# Patient Record
Sex: Female | Born: 1980
Health system: Southern US, Community
[De-identification: ages and names within clinical notes are randomized; demographics above are authoritative.]

## PROBLEM LIST (undated history)

## (undated) ENCOUNTER — Inpatient Hospital Stay (HOSPITAL_COMMUNITY): Payer: Self-pay

## (undated) DIAGNOSIS — R1310 Dysphagia, unspecified: Secondary | ICD-10-CM

## (undated) DIAGNOSIS — I00 Rheumatic fever without heart involvement: Secondary | ICD-10-CM

## (undated) DIAGNOSIS — Z9289 Personal history of other medical treatment: Secondary | ICD-10-CM

## (undated) DIAGNOSIS — T7840XA Allergy, unspecified, initial encounter: Secondary | ICD-10-CM

## (undated) DIAGNOSIS — K219 Gastro-esophageal reflux disease without esophagitis: Secondary | ICD-10-CM

## (undated) DIAGNOSIS — T8859XA Other complications of anesthesia, initial encounter: Secondary | ICD-10-CM

## (undated) DIAGNOSIS — G709 Myoneural disorder, unspecified: Secondary | ICD-10-CM

## (undated) DIAGNOSIS — Z8759 Personal history of other complications of pregnancy, childbirth and the puerperium: Secondary | ICD-10-CM

## (undated) DIAGNOSIS — G43909 Migraine, unspecified, not intractable, without status migrainosus: Secondary | ICD-10-CM

## (undated) DIAGNOSIS — R011 Cardiac murmur, unspecified: Secondary | ICD-10-CM

## (undated) DIAGNOSIS — R319 Hematuria, unspecified: Secondary | ICD-10-CM

## (undated) DIAGNOSIS — E739 Lactose intolerance, unspecified: Secondary | ICD-10-CM

## (undated) DIAGNOSIS — Z87442 Personal history of urinary calculi: Secondary | ICD-10-CM

## (undated) DIAGNOSIS — B009 Herpesviral infection, unspecified: Secondary | ICD-10-CM

## (undated) DIAGNOSIS — Z1589 Genetic susceptibility to other disease: Secondary | ICD-10-CM

## (undated) DIAGNOSIS — Z973 Presence of spectacles and contact lenses: Secondary | ICD-10-CM

## (undated) DIAGNOSIS — E7212 Methylenetetrahydrofolate reductase deficiency: Secondary | ICD-10-CM

## (undated) DIAGNOSIS — J45909 Unspecified asthma, uncomplicated: Secondary | ICD-10-CM

## (undated) DIAGNOSIS — Z8739 Personal history of other diseases of the musculoskeletal system and connective tissue: Secondary | ICD-10-CM

## (undated) DIAGNOSIS — D649 Anemia, unspecified: Secondary | ICD-10-CM

## (undated) DIAGNOSIS — Z8679 Personal history of other diseases of the circulatory system: Secondary | ICD-10-CM

## (undated) DIAGNOSIS — I7300 Raynaud's syndrome without gangrene: Secondary | ICD-10-CM

## (undated) DIAGNOSIS — R002 Palpitations: Secondary | ICD-10-CM

## (undated) DIAGNOSIS — I341 Nonrheumatic mitral (valve) prolapse: Secondary | ICD-10-CM

## (undated) DIAGNOSIS — T4145XA Adverse effect of unspecified anesthetic, initial encounter: Secondary | ICD-10-CM

## (undated) HISTORY — DX: Methylenetetrahydrofolate reductase deficiency: E72.12

## (undated) HISTORY — DX: Allergy, unspecified, initial encounter: T78.40XA

## (undated) HISTORY — PX: NASAL SINUS SURGERY: SHX719

## (undated) HISTORY — DX: Adverse effect of unspecified anesthetic, initial encounter: T41.45XA

## (undated) HISTORY — DX: Cardiac murmur, unspecified: R01.1

## (undated) HISTORY — DX: Genetic susceptibility to other disease: Z15.89

## (undated) HISTORY — DX: Migraine, unspecified, not intractable, without status migrainosus: G43.909

## (undated) HISTORY — DX: Lactose intolerance, unspecified: E73.9

## (undated) HISTORY — DX: Herpesviral infection, unspecified: B00.9

## (undated) HISTORY — DX: Palpitations: R00.2

## (undated) HISTORY — DX: Other complications of anesthesia, initial encounter: T88.59XA

## (undated) HISTORY — DX: Myoneural disorder, unspecified: G70.9

## (undated) HISTORY — DX: Anemia, unspecified: D64.9

## (undated) HISTORY — PX: KIDNEY STONE SURGERY: SHX686

## (undated) HISTORY — DX: Rheumatic fever without heart involvement: I00

## (undated) HISTORY — DX: Dysphagia, unspecified: R13.10

## (undated) HISTORY — PX: WISDOM TOOTH EXTRACTION: SHX21

## (undated) HISTORY — DX: Gastro-esophageal reflux disease without esophagitis: K21.9

---
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
## 2002-04-07 ENCOUNTER — Encounter: Admission: RE | Admit: 2002-04-07 | Discharge: 2002-04-07 | Payer: Self-pay | Admitting: Internal Medicine

## 2002-04-07 ENCOUNTER — Encounter: Payer: Self-pay | Admitting: Internal Medicine

## 2002-04-29 ENCOUNTER — Encounter: Admission: RE | Admit: 2002-04-29 | Discharge: 2002-06-16 | Payer: Self-pay | Admitting: Family Medicine

## 2006-01-17 ENCOUNTER — Other Ambulatory Visit: Admission: RE | Admit: 2006-01-17 | Discharge: 2006-01-17 | Payer: Self-pay | Admitting: Family Medicine

## 2008-04-28 ENCOUNTER — Inpatient Hospital Stay (HOSPITAL_COMMUNITY): Admission: AD | Admit: 2008-04-28 | Discharge: 2008-04-28 | Payer: Self-pay | Admitting: Obstetrics & Gynecology

## 2010-07-18 ENCOUNTER — Emergency Department (HOSPITAL_COMMUNITY): Admission: EM | Admit: 2010-07-18 | Discharge: 2010-07-19 | Payer: Self-pay | Admitting: Emergency Medicine

## 2011-07-19 LAB — RH IMMUNE GLOBULIN WORKUP (NOT WOMEN'S HOSP): Weak D: NEGATIVE

## 2011-10-23 ENCOUNTER — Encounter: Payer: Self-pay | Admitting: *Deleted

## 2011-10-23 ENCOUNTER — Emergency Department (HOSPITAL_BASED_OUTPATIENT_CLINIC_OR_DEPARTMENT_OTHER)
Admission: EM | Admit: 2011-10-23 | Discharge: 2011-10-23 | Disposition: A | Discharge status: Home / Self Care | Payer: Medicaid Other | Attending: Emergency Medicine | Admitting: Emergency Medicine

## 2011-10-23 DIAGNOSIS — J329 Chronic sinusitis, unspecified: Secondary | ICD-10-CM

## 2011-10-23 DIAGNOSIS — R51 Headache: Secondary | ICD-10-CM | POA: Insufficient documentation

## 2011-10-23 HISTORY — DX: Migraine, unspecified, not intractable, without status migrainosus: G43.909

## 2011-10-23 MED ORDER — AMOXICILLIN 500 MG PO CAPS: 500.0000 mg | ORAL_CAPSULE | Freq: Three times a day (TID) | ORAL | Status: AC

## 2011-10-23 MED ORDER — KETOROLAC TROMETHAMINE 60 MG/2ML IM SOLN
60.0000 mg | Freq: Once | INTRAMUSCULAR | Status: AC
  Administered 2011-10-23: 60 mg via INTRAMUSCULAR
  Filled 2011-10-23: qty 2

## 2011-10-23 NOTE — ED Notes (Addendum)
Headache for 3 days has tried drinking lots of water taking tylenol and sleeping mostly on left temporal area no sensitivity to light or sound sitting up makes it hurt worse bending over makes it worse

## 2011-10-23 NOTE — ED Provider Notes (Signed)
Medical screening examination/treatment/procedure(s) were performed by non-physician practitioner and as supervising physician I was immediately available for consultation/collaboration.   Leigh-Ann Emalina Dubreuil, MD 10/23/11 1918 

## 2011-10-23 NOTE — ED Provider Notes (Signed)
History     CSN: [id number]  Arrival date & time 10/23/11  1705   First MD Initiated Contact with Patient 10/23/11 1740      Chief Complaint  Patient presents with  . Headache    (Consider location/radiation/quality/duration/timing/severity/associated sxs/prior treatment) HPI Comments: Pt states that she has a history of migraines, but this feels different then normal  Patient is a 31 y.o. female presenting with headaches. The history is provided by the patient. No language interpreter was used.  Headache  This is a new problem. The current episode started more than 2 days ago. The problem occurs constantly. The problem has not changed since onset.Associated with: position. The pain is located in the left unilateral region. The quality of the pain is described as throbbing. The pain is moderate. The pain radiates to the face. Pertinent negatives include no fever, no palpitations, no syncope, no nausea and no vomiting. She has tried nothing for the symptoms.    Past Medical History  Diagnosis Date  . Migraines     History reviewed. No pertinent past surgical history.  History reviewed. No pertinent family history.  History  Substance Use Topics  . Smoking status: Never Smoker   . Smokeless tobacco: Not on file  . Alcohol Use: Yes     occ    OB History    Grav Para Term Preterm Abortions TAB SAB Ect Mult Living                  Review of Systems  Constitutional: Negative for fever.  Cardiovascular: Negative for palpitations and syncope.  Gastrointestinal: Negative for nausea and vomiting.  Neurological: Positive for headaches.  All other systems reviewed and are negative.    Allergies  Other and Codeine  Home Medications   Current Outpatient Rx  Name Route Sig Dispense Refill  . ACETAMINOPHEN 325 MG PO TABS Oral Take 1,300 mg by mouth every 6 (six) hours as needed. For headache     . CETIRIZINE HCL 10 MG PO TABS Oral Take 10 mg by mouth daily.      .  DROSPIRENONE-ETHINYL ESTRADIOL 3-0.02 MG PO TABS Oral Take 1 tablet by mouth daily.      . TOPIRAMATE 15 MG PO CPSP Oral Take 15 mg by mouth daily.      . AMOXICILLIN 500 MG PO CAPS Oral Take 1 capsule (500 mg total) by mouth 3 (three) times daily. 30 capsule 0    BP 130/87  Temp(Src) 98.5 F (36.9 C) (Oral)  Resp 18  Wt 132 lb (59.875 kg)  SpO2 100%  Physical Exam  Nursing note and vitals reviewed. Constitutional: She is oriented to person, place, and time. She appears well-developed and well-nourished.  HENT:  Right Ear: External ear normal.  Left Ear: External ear normal.  Nose: Left sinus exhibits maxillary sinus tenderness and frontal sinus tenderness.  Cardiovascular: Normal rate and regular rhythm.   Pulmonary/Chest: Effort normal.  Musculoskeletal: Normal range of motion.  Neurological: She is alert and oriented to person, place, and time.  Skin: Skin is warm and dry.  Psychiatric: She has a normal mood and affect.    ED Course  Procedures (including critical care time)  Labs Reviewed - No data to display No results found.   1. Sinusitis       MDM  Will treat pt        Teressa Lower, NP 10/23/11 1848

## 2013-01-01 DIAGNOSIS — J309 Allergic rhinitis, unspecified: Secondary | ICD-10-CM | POA: Insufficient documentation

## 2013-02-19 HISTORY — PX: NASAL SEPTUM SURGERY: SHX37

## 2013-03-07 ENCOUNTER — Emergency Department (HOSPITAL_BASED_OUTPATIENT_CLINIC_OR_DEPARTMENT_OTHER)
Admission: EM | Admit: 2013-03-07 | Discharge: 2013-03-07 | Disposition: A | Discharge status: Home / Self Care | Payer: Medicaid Other | Attending: Emergency Medicine | Admitting: Emergency Medicine

## 2013-03-07 ENCOUNTER — Encounter (HOSPITAL_BASED_OUTPATIENT_CLINIC_OR_DEPARTMENT_OTHER): Payer: Self-pay | Admitting: Emergency Medicine

## 2013-03-07 DIAGNOSIS — R609 Edema, unspecified: Secondary | ICD-10-CM | POA: Insufficient documentation

## 2013-03-07 DIAGNOSIS — R209 Unspecified disturbances of skin sensation: Secondary | ICD-10-CM | POA: Insufficient documentation

## 2013-03-07 DIAGNOSIS — Z79899 Other long term (current) drug therapy: Secondary | ICD-10-CM | POA: Insufficient documentation

## 2013-03-07 DIAGNOSIS — Z9889 Other specified postprocedural states: Secondary | ICD-10-CM | POA: Insufficient documentation

## 2013-03-07 DIAGNOSIS — G43909 Migraine, unspecified, not intractable, without status migrainosus: Secondary | ICD-10-CM | POA: Insufficient documentation

## 2013-03-07 DIAGNOSIS — Z792 Long term (current) use of antibiotics: Secondary | ICD-10-CM | POA: Insufficient documentation

## 2013-03-07 LAB — CBC WITH DIFFERENTIAL/PLATELET
Basophils Absolute: 0 10*3/uL (ref 0.0–0.1)
Basophils Relative: 0 % (ref 0–1)
Lymphocytes Relative: 48 % — ABNORMAL HIGH (ref 12–46)
MCHC: 34.5 g/dL (ref 30.0–36.0)
Neutro Abs: 4 10*3/uL (ref 1.7–7.7)
Neutrophils Relative %: 43 % (ref 43–77)
Platelets: 262 10*3/uL (ref 150–400)
RDW: 12 % (ref 11.5–15.5)
WBC: 9.2 10*3/uL (ref 4.0–10.5)

## 2013-03-07 LAB — COMPREHENSIVE METABOLIC PANEL
Alkaline Phosphatase: 69 U/L (ref 39–117)
BUN: 10 mg/dL (ref 6–23)
Calcium: 9.5 mg/dL (ref 8.4–10.5)
GFR calc Af Amer: >90 mL/min (ref >90)
GFR calc non Af Amer: 86 mL/min — ABNORMAL LOW (ref >90)
Glucose, Bld: 88 mg/dL (ref 70–99)
Potassium: 3.9 mEq/L (ref 3.5–5.1)
Sodium: 141 mEq/L (ref 135–145)
Total Protein: 6.2 g/dL (ref 6.0–8.3)

## 2013-03-07 LAB — URINALYSIS, ROUTINE W REFLEX MICROSCOPIC
Ketones, ur: NEGATIVE mg/dL
Leukocytes, UA: NEGATIVE
Nitrite: NEGATIVE
Specific Gravity, Urine: 1.006 (ref 1.005–1.030)
Urobilinogen, UA: 0.2 mg/dL (ref 0.0–1.0)
pH: 6.5 (ref 5.0–8.0)

## 2013-03-07 LAB — URINE MICROSCOPIC-ADD ON

## 2013-03-07 MED ORDER — SENNOSIDES-DOCUSATE SODIUM 8.6-50 MG PO TABS: 2.0000 | ORAL_TABLET | Freq: Every day | ORAL | Status: DC

## 2013-03-07 NOTE — ED Provider Notes (Signed)
History     CSN: [id number]  Arrival date & time 03/07/13  1026   First MD Initiated Contact with Patient 03/07/13 1146      Chief Complaint  Patient presents with  . Edema    (Consider location/radiation/quality/duration/timing/severity/associated sxs/prior treatment) HPI Patient presents 4 days after surgery with concern over swelling in her lower back, upper thighs. Surgery was for chronic sinusitis, with significant debridement of intra-sinus tissue, septal revision, meatus opening. She states that she has been generally well since surgery, but over the past day has developed swelling in her lower back with mild dysesthesia across the area. No new fever, no new nausea, vomiting, chest pain, dyspnea, abdominal pain. No tabs or relief with anything. She does state that since she has been home she has been largely sedentary. Past Medical History  Diagnosis Date  . Migraines     Past Surgical History  Procedure Laterality Date  . Nasal septum surgery      No family history on file.  History  Substance Use Topics  . Smoking status: Never Smoker   . Smokeless tobacco: Not on file  . Alcohol Use: Yes     Comment: occ    OB History   Grav Para Term Preterm Abortions TAB SAB Ect Mult Living                  Review of Systems  All other systems reviewed and are negative.    Allergies  Other and Codeine  Home Medications   Current Outpatient Rx  Name  Route  Sig  Dispense  Refill  . cetirizine (ZYRTEC) 10 MG tablet   Oral   Take 10 mg by mouth daily.           . diphenhydrAMINE (BENADRYL) 25 mg capsule   Oral   Take 25 mg by mouth every 6 (six) hours as needed for itching.         . docusate sodium (COLACE) 100 MG capsule   Oral   Take 100 mg by mouth 2 (two) times daily.         Marland Kitchen HYDROcodone-acetaminophen (NORCO/VICODIN) 5-325 MG per tablet   Oral   Take 2 tablets by mouth every 4 (four) hours as needed for pain.         Marland Kitchen ibuprofen  (ADVIL,MOTRIN) 200 MG tablet   Oral   Take 200 mg by mouth every 6 (six) hours as needed for pain.         Marland Kitchen levofloxacin (LEVAQUIN) 750 MG tablet   Oral   Take 750 mg by mouth daily.         . ondansetron (ZOFRAN-ODT) 4 MG disintegrating tablet   Oral   Take 4 mg by mouth every 8 (eight) hours as needed for nausea.         Marland Kitchen topiramate (TOPAMAX) 15 MG capsule   Oral   Take 15 mg by mouth daily.             BP 122/68  Pulse 75  Temp(Src) 98.9 F (37.2 C) (Oral)  Resp 20  Ht 5\' 5"  (1.651 m)  Wt 149 lb (67.586 kg)  BMI 24.79 kg/m2  SpO2 98%  LMP 02/24/2013  Physical Exam  Nursing note and vitals reviewed. Constitutional: She is oriented to person, place, and time. She appears well-developed and well-nourished. No distress.  HENT:  Head: Normocephalic.  Patient is wearing a surgical dressing across the anterior aspect of her nose.  No  active bleeding, no discharge, superficially everything appears clean, dry, intact.  Eyes: Conjunctivae and EOM are normal.  Cardiovascular: Normal rate and regular rhythm.   Pulmonary/Chest: Effort normal and breath sounds normal. No stridor. No respiratory distress.  Abdominal: She exhibits no distension.  Musculoskeletal: She exhibits no edema.  Neurological: She is alert and oriented to person, place, and time. No cranial nerve deficit.  Skin: Skin is warm and dry.  Psychiatric: She has a normal mood and affect.    ED Course  Procedures (including critical care time)  Labs Reviewed  CBC WITH DIFFERENTIAL - Abnormal; Notable for the following:    RBC 3.68 (*)    Hemoglobin 11.0 (*)    HCT 31.9 (*)    Lymphocytes Relative 48 (*)    Lymphs Abs 4.4 (*)    All other components within normal limits  COMPREHENSIVE METABOLIC PANEL  URINALYSIS, ROUTINE W REFLEX MICROSCOPIC   No results found.   No diagnosis found.  3:02 PM Patient reexamined.  No new complaints. She remained hemodynamically stable. I discussed all  findings thus far with the patient and her father. We also discussed at length the patient's analgesic regimen. I suggested substituting acetaminophen or ibuprofen, keeping in mind toxic levels to attempt to wean from narcotics.  Pulse ox 99% room air normal MDM  This patient presents 4 days after nasal surgery with ongoing concern over swelling in her lower back.  She is awake and alert, not hypoxic, tachypneic, tachycardic, with no dyspnea.  The patient has no risk factors for DVT /pe, and has not truly been immobilized. Given this absence of concerns, imaging was not indicated, but conducted to evaluate for electrolyte abnormalities, infection.  Labs were mildly abnormal, and the patient was made aware of these findings. Acid distress, with an otherwise reassuring physical exam, she was discharged to followup with her ENT doctor in 2 days as previously scheduled.         Gerhard Munch, MD 03/07/13 1504

## 2013-03-07 NOTE — ED Notes (Signed)
Pt states she has swelling at lower back with some numbness today.  Pt recently had nasal surgery on Wednesday.  No known injury.  Pt states she has had to remain upright after surgery.  No dysuria.  Some chills.

## 2013-06-30 ENCOUNTER — Emergency Department (HOSPITAL_COMMUNITY)
Admission: EM | Admit: 2013-06-30 | Discharge: 2013-06-30 | Disposition: A | Discharge status: Home / Self Care | Payer: Medicaid Other | Attending: Emergency Medicine | Admitting: Emergency Medicine

## 2013-06-30 ENCOUNTER — Emergency Department (HOSPITAL_COMMUNITY): Payer: Medicaid Other

## 2013-06-30 ENCOUNTER — Encounter (HOSPITAL_COMMUNITY): Payer: Self-pay | Admitting: Emergency Medicine

## 2013-06-30 DIAGNOSIS — S60219A Contusion of unspecified wrist, initial encounter: Secondary | ICD-10-CM | POA: Insufficient documentation

## 2013-06-30 DIAGNOSIS — Y93B9 Activity, other involving muscle strengthening exercises: Secondary | ICD-10-CM | POA: Insufficient documentation

## 2013-06-30 DIAGNOSIS — Y9239 Other specified sports and athletic area as the place of occurrence of the external cause: Secondary | ICD-10-CM | POA: Insufficient documentation

## 2013-06-30 DIAGNOSIS — Z79899 Other long term (current) drug therapy: Secondary | ICD-10-CM | POA: Insufficient documentation

## 2013-06-30 DIAGNOSIS — J45909 Unspecified asthma, uncomplicated: Secondary | ICD-10-CM | POA: Insufficient documentation

## 2013-06-30 DIAGNOSIS — S63509A Unspecified sprain of unspecified wrist, initial encounter: Secondary | ICD-10-CM | POA: Insufficient documentation

## 2013-06-30 DIAGNOSIS — S60212A Contusion of left wrist, initial encounter: Secondary | ICD-10-CM

## 2013-06-30 DIAGNOSIS — W1789XA Other fall from one level to another, initial encounter: Secondary | ICD-10-CM | POA: Insufficient documentation

## 2013-06-30 DIAGNOSIS — G43909 Migraine, unspecified, not intractable, without status migrainosus: Secondary | ICD-10-CM | POA: Insufficient documentation

## 2013-06-30 DIAGNOSIS — Z8679 Personal history of other diseases of the circulatory system: Secondary | ICD-10-CM | POA: Insufficient documentation

## 2013-06-30 HISTORY — DX: Nonrheumatic mitral (valve) prolapse: I34.1

## 2013-06-30 NOTE — Progress Notes (Signed)
Pt confirms pcp is still [facility]

## 2013-06-30 NOTE — ED Notes (Signed)
Pt stepped down off of equipment at gym. Pt landed on l/wrist. C/o severe swelling and pain in l/wrist. Gustavus Bryant applied

## 2013-06-30 NOTE — ED Provider Notes (Signed)
CSN: [id number]     Arrival date & time 06/30/13  1616 History  This chart was scribed for non-physician practitioner, Darnelle Going, working with Enid Skeens, MD by Ronal Fear, ED scribe. This patient was seen in room WTR6/WTR6 and the patient's care was started at 5:31 PM.    Chief Complaint  Patient presents with  . Wrist Pain    l/wrist pain after fall    The history is provided by the patient. No language interpreter was used.   HPI Comments: Shelby Norton is a 32 y.o. female who presents to the Emergency Department complaining of left wrist pain and swelling after falling off of gym equipment during a personal training session. Pt feels tingling in her fingers, mainly in the middle and index fingers.The pain worsens upon pronating the wrist, extending the fingers or clinching the fist.  There is no numbness.      Past Medical History  Diagnosis Date  . Migraines   . Asthma   . Mitral valve prolapse    Past Surgical History  Procedure Laterality Date  . Nasal septum surgery    . Tonsillectomy     Family History  Problem Relation Age of Onset  . Diabetes Mother   . Hypertension Mother   . Diabetes Father   . Hypertension Father   . Cancer Other   . Stroke Other    History  Substance Use Topics  . Smoking status: Never Smoker   . Smokeless tobacco: Not on file  . Alcohol Use: Yes     Comment: occ   OB History   Grav Para Term Preterm Abortions TAB SAB Ect Mult Living                 Review of Systems  Musculoskeletal: Positive for joint swelling and arthralgias.  All other systems reviewed and are negative.    Allergies  Other; Codeine; and Hydrocodone  Home Medications   Current Outpatient Rx  Name  Route  Sig  Dispense  Refill  . aspirin-acetaminophen-caffeine (EXCEDRIN EXTRA STRENGTH) 250-250-65 MG per tablet   Oral   Take 1 tablet by mouth every 6 (six) hours as needed for pain (pain).         . cetirizine (ZYRTEC) 10 MG tablet    Oral   Take 10 mg by mouth daily.           . Multiple Vitamins-Minerals (MULTIVITAMIN WITH MINERALS) tablet   Oral   Take 1 tablet by mouth daily.          BP 126/83  Pulse 77  Temp(Src) 98.3 F (36.8 C) (Oral)  Resp 18  Wt 151 lb (68.493 kg)  BMI 25.13 kg/m2  SpO2 100%  LMP 06/25/2013 Physical Exam  Nursing note and vitals reviewed. Constitutional: She is oriented to person, place, and time. She appears well-developed and well-nourished. No distress.  HENT:  Head: Normocephalic and atraumatic.  Eyes: EOM are normal.  Neck: Neck supple. No tracheal deviation present.  Cardiovascular: Normal rate.   Pulmonary/Chest: Effort normal. No respiratory distress.  Musculoskeletal:       Left wrist: She exhibits decreased range of motion, tenderness, bony tenderness and swelling. She exhibits no effusion, no crepitus, no deformity and no laceration.  Neurological: She is alert and oriented to person, place, and time.  Skin: Skin is warm and dry.  Psychiatric: She has a normal mood and affect. Her behavior is normal.    ED Course  Procedures (including  critical care time)\  DIAGNOSTIC STUDIES: Oxygen Saturation is 100% on RA, normal by my interpretation.    COORDINATION OF CARE: 5:46 PM- Pt advised of plan for treatment including getting a Velcro splint with thumb spica and was advised that if pain continues after 2 weeks that she should come back for x ray.  Pt was advised to continue taking OTC naproxen along with icing the wrist and pt agrees.     Labs Review Labs Reviewed - No data to display Imaging Review No results found.  MDM   1. Wrist sprain and strain, left, initial encounter   2. Wrist contusion, left, initial encounter    32 y.o.Shelby Norton's evaluation in the Emergency Department is complete. It has been determined that no acute conditions requiring further emergency intervention are present at this time. The patient/guardian have been advised of the  diagnosis and plan. We have discussed signs and symptoms that warrant return to the ED, such as changes or worsening in symptoms.  Vital signs are stable at discharge. Filed Vitals:   06/30/13 1653  BP: 126/83  Pulse: 77  Temp: 98.3 F (36.8 C)  Resp: 18    Patient/guardian has voiced understanding and agreed to follow-up with the PCP or specialist.  I personally performed the services described in this documentation, which was scribed in my presence. The recorded information has been reviewed and is accurate.   Dorthula Matas, PA-C 07/07/13 2054  Dorthula Matas, PA-C 07/07/13 2054

## 2013-06-30 NOTE — Discharge Instructions (Signed)
Wrist Pain Wrist injuries are frequent in adults and children. A sprain is an injury to the ligaments that hold your bones together. A strain is an injury to muscle or muscle cord-like structures (tendons) from stretching or pulling. Generally, when wrists are moderately tender to touch following a fall or injury, a break in the bone (fracture) may be present. Most wrist sprains or strains are better in 3 to 5 days, but complete healing may take several weeks. HOME CARE INSTRUCTIONS   Put ice on the injured area.  Put ice in a plastic bag.  Place a towel between your skin and the bag.  Leave the ice on for 15-20 minutes, 3-4 times a day, for the first 2 days.  Keep your arm raised above the level of your heart whenever possible to reduce swelling and pain.  Rest the injured area for at least 48 hours or as directed by your caregiver.  If a splint or elastic bandage has been applied, use it for as long as directed by your caregiver or until seen by a caregiver for a follow-up exam.  Only take over-the-counter or prescription medicines for pain, discomfort, or fever as directed by your caregiver.  Keep all follow-up appointments. You may need to follow up with a specialist or have follow-up X-rays. Improvement in pain level is not a guarantee that you did not fracture a bone in your wrist. The only way to determine whether or not you have a broken bone is by X-ray. SEEK IMMEDIATE MEDICAL CARE IF:   Your fingers are swollen, very red, white, or cold and blue.  Your fingers are numb or tingling.  You have increasing pain.  You have difficulty moving your fingers. MAKE SURE YOU:   Understand these instructions.  Will watch your condition.  Will get help right away if you are not doing well or get worse. Document Released: 07/18/2005 Document Revised: 12/31/2011 Document Reviewed: 11/29/2010 [facility] Patient Information 2014 [place], [place].  Wrist Sprain with Rehab A sprain is an  injury in which a ligament that maintains the proper alignment of a joint is partially or completely torn. The ligaments of the wrist are susceptible to sprains. Sprains are classified into three categories. Grade 1 sprains cause pain, but the tendon is not lengthened. Grade 2 sprains include a lengthened ligament because the ligament is stretched or partially ruptured. With grade 2 sprains there is still function, although the function may be diminished. Grade 3 sprains are characterized by a complete tear of the tendon or muscle, and function is usually impaired. SYMPTOMS   Pain tenderness, inflammation, and/or bruising (contusion) of the injury.  A "pop" or tear felt and/or heard at the time of injury.  Decreased wrist function. CAUSES  A wrist sprain occurs when a force is placed on one or more ligaments that is greater than it/they can withstand. Common mechanisms of injury include:  Catching a ball with you hands.  Repetitive and/ or strenuous extension or flexion of the wrist. RISK INCREASES WITH:  Previous wrist injury.  Contact sports (boxing or wrestling).  Activities in which falling is common.  Poor strength and flexibility.  Improperly fitted or padded protective equipment. PREVENTION  Warm up and stretch properly before activity.  Allow for adequate recovery between workouts.  Maintain physical fitness:  Strength, flexibility, and endurance.  Cardiovascular fitness.  Protect the wrist joint by limiting its motion with the use of taping, braces, or splints.  Protect the wrist after injury for 6  to 12 months. PROGNOSIS  The prognosis for wrist sprains depends on the degree of injury. Grade 1 sprains require 2 to 6 weeks of treatment. Grade 2 sprains require 6 to 8 weeks of treatment, and grade 3 sprains require up to 12 weeks.  RELATED COMPLICATIONS   Prolonged healing time, if improperly treated or re-injured.  Recurrent symptoms that result in a chronic  problem.  Injury to nearby structures (bone, cartilage, nerves, or tendons).  Arthritis of the wrist.  Inability to compete in athletics at a high level.  Wrist stiffness or weakness.  Progression to a complete rupture of the ligament. TREATMENT  Treatment initially involves resting from any activities that aggravate the symptoms, and the use of ice and medications to help reduce pain and inflammation. Your caregiver may recommend immobilizing the wrist for a period of time in order to reduce stress on the ligament and allow for healing. After immobilization it is important to perform strengthening and stretching exercises to help regain strength and a full range of motion. These exercises may be completed at home or with a therapist. Surgery is not usually required for wrist sprains, unless the ligament has been ruptured (grade 3 sprain). MEDICATION   If pain medication is necessary, then nonsteroidal anti-inflammatory medications, such as aspirin and ibuprofen, or other minor pain relievers, such as acetaminophen, are often recommended.  Do not take pain medication for 7 days before surgery.  Prescription pain relievers may be given if deemed necessary by your caregiver. Use only as directed and only as much as you need. HEAT AND COLD  Cold treatment (icing) relieves pain and reduces inflammation. Cold treatment should be applied for 10 to 15 minutes every 2 to 3 hours for inflammation and pain and immediately after any activity that aggravates your symptoms. Use ice packs or massage the area with a piece of ice (ice massage).  Heat treatment may be used prior to performing the stretching and strengthening activities prescribed by your caregiver, physical therapist, or athletic trainer. Use a heat pack or soak your injury in warm water. SEEK MEDICAL CARE IF:  Treatment seems to offer no benefit, or the condition worsens.  Any medications produce adverse side effects. EXERCISES RANGE  OF MOTION (ROM) AND STRETCHING EXERCISES - Wrist Sprain  These exercises may help you when beginning to rehabilitate your injury. Your symptoms may resolve with or without further involvement from your physician, physical therapist or athletic trainer. While completing these exercises, remember:   Restoring tissue flexibility helps normal motion to return to the joints. This allows healthier, less painful movement and activity.  An effective stretch should be held for at least 30 seconds.  A stretch should never be painful. You should only feel a gentle lengthening or release in the stretched tissue. RANGE OF MOTION  Wrist Flexion, Active-Assisted  Extend your right / left elbow with your fingers pointing down.*  Gently pull the back of your hand towards you until you feel a gentle stretch on the top of your forearm.  Hold this position for __________ seconds. Repeat __________ times. Complete this exercise __________ times per day.  *If directed by your physician, physical therapist or athletic trainer, complete this stretch with your elbow bent rather than extended. RANGE OF MOTION  Wrist Extension, Active-Assisted  Extend your right / left elbow and turn your palm upwards.*  Gently pull your palm/fingertips back so your wrist extends and your fingers point more toward the ground.  You should feel a gentle  stretch on the inside of your forearm.  Hold this position for __________ seconds. Repeat __________ times. Complete this exercise __________ times per day. *If directed by your physician, physical therapist or athletic trainer, complete this stretch with your elbow bent, rather than extended. RANGE OF MOTION  Supination, Active  Stand or sit with your elbows at your side. Bend your right / left elbow to 90 degrees.  Turn your palm upward until you feel a gentle stretch on the inside of your forearm.  Hold this position for __________ seconds. Slowly release and return to the  starting position. Repeat __________ times. Complete this stretch __________ times per day.  RANGE OF MOTION  Pronation, Active  Stand or sit with your elbows at your side. Bend your right / left elbow to 90 degrees.  Turn your palm downward until you feel a gentle stretch on the top of your forearm.  Hold this position for __________ seconds. Slowly release and return to the starting position. Repeat __________ times. Complete this stretch __________ times per day.  STRETCH - Wrist Flexion  Place the back of your right / left hand on a tabletop leaving your elbow slightly bent. Your fingers should point away from your body.  Gently press the back of your hand down onto the table by straightening your elbow. You should feel a stretch on the top of your forearm.  Hold this position for __________ seconds. Repeat __________ times. Complete this stretch __________ times per day.  STRETCH  Wrist Extension  Place your right / left fingertips on a tabletop leaving your elbow slightly bent. Your fingers should point backwards.  Gently press your fingers and palm down onto the table by straightening your elbow. You should feel a stretch on the inside of your forearm.  Hold this position for __________ seconds. Repeat __________ times. Complete this stretch __________ times per day.  STRENGTHENING EXERCISES - Wrist Sprain These exercises may help you when beginning to rehabilitate your injury. They may resolve your symptoms with or without further involvement from your physician, physical therapist or athletic trainer. While completing these exercises, remember:   Muscles can gain both the endurance and the strength needed for everyday activities through controlled exercises.  Complete these exercises as instructed by your physician, physical therapist or athletic trainer. Progress with the resistance and repetition exercises only as your caregiver advises. STRENGTH Wrist Flexors  Sit with  your right / left forearm palm-up and fully supported. Your elbow should be resting below the height of your shoulder. Allow your wrist to extend over the edge of the surface.  Loosely holding a __________ weight or a piece of rubber exercise band/tubing, slowly curl your hand up toward your forearm.  Hold this position for __________ seconds. Slowly lower the wrist back to the starting position in a controlled manner. Repeat __________ times. Complete this exercise __________ times per day.  STRENGTH  Wrist Extensors  Sit with your right / left forearm palm-down and fully supported. Your elbow should be resting below the height of your shoulder. Allow your wrist to extend over the edge of the surface.  Loosely holding a __________ weight or a piece of rubber exercise band/tubing, slowly curl your hand up toward your forearm.  Hold this position for __________ seconds. Slowly lower the wrist back to the starting position in a controlled manner. Repeat __________ times. Complete this exercise __________ times per day.  STRENGTH - Ulnar Deviators  Stand with a ____________________ weight in your right /  left hand, or sit holding on to the rubber exercise band/tubing with your opposite arm supported.  Move your wrist so that your pinkie travels toward your forearm and your thumb moves away from your forearm.  Hold this position for __________ seconds and then slowly lower the wrist back to the starting position. Repeat __________ times. Complete this exercise __________ times per day STRENGTH - Radial Deviators  Stand with a ____________________ weight in your  right / left hand, or sit holding on to the rubber exercise band/tubing with your arm supported.  Raise your hand upward in front of you or pull up on the rubber tubing.  Hold this position for __________ seconds and then slowly lower the wrist back to the starting position. Repeat __________ times. Complete this exercise  __________ times per day. STRENGTH  Forearm Supinators  Sit with your right / left forearm supported on a table, keeping your elbow below shoulder height. Rest your hand over the edge, palm down.  Gently grip a hammer or a soup ladle.  Without moving your elbow, slowly turn your palm and hand upward to a "thumbs-up" position.  Hold this position for __________ seconds. Slowly return to the starting position. Repeat __________ times. Complete this exercise __________ times per day.  STRENGTH  Forearm Pronators  Sit with your right / left forearm supported on a table, keeping your elbow below shoulder height. Rest your hand over the edge, palm up.  Gently grip a hammer or a soup ladle.  Without moving your elbow, slowly turn your palm and hand upward to a "thumbs-up" position.  Hold this position for __________ seconds. Slowly return to the starting position. Repeat __________ times. Complete this exercise __________ times per day.  STRENGTH - Grip  Grasp a tennis ball, a dense sponge, or a large, rolled sock in your hand.  Squeeze as hard as you can without increasing any pain.  Hold this position for __________ seconds. Release your grip slowly. Repeat __________ times. Complete this exercise __________ times per day.  Document Released: 10/08/2005 Document Revised: 12/31/2011 Document Reviewed: 01/20/2009 [facility] Patient Information 2014 [place], [place].

## 2013-07-09 NOTE — ED Provider Notes (Signed)
Medical screening examination/treatment/procedure(s) were performed by non-physician practitioner and as supervising physician I was immediately available for consultation/collaboration.  Enid Skeens, MD 07/09/13 2139

## 2013-10-02 DIAGNOSIS — J4520 Mild intermittent asthma, uncomplicated: Secondary | ICD-10-CM | POA: Insufficient documentation

## 2013-12-20 DIAGNOSIS — Z8739 Personal history of other diseases of the musculoskeletal system and connective tissue: Secondary | ICD-10-CM

## 2013-12-20 HISTORY — DX: Personal history of other diseases of the musculoskeletal system and connective tissue: Z87.39

## 2014-01-07 ENCOUNTER — Encounter (HOSPITAL_COMMUNITY): Payer: Self-pay | Admitting: Emergency Medicine

## 2014-01-07 ENCOUNTER — Inpatient Hospital Stay (HOSPITAL_COMMUNITY)
Admission: EM | Admit: 2014-01-07 | Discharge: 2014-01-11 | DRG: 558 | Disposition: A | Discharge status: Home / Self Care | Payer: Medicaid Other | Attending: Internal Medicine | Admitting: Internal Medicine

## 2014-01-07 DIAGNOSIS — M6282 Rhabdomyolysis: Principal | ICD-10-CM | POA: Diagnosis present

## 2014-01-07 DIAGNOSIS — R252 Cramp and spasm: Secondary | ICD-10-CM

## 2014-01-07 DIAGNOSIS — Z79899 Other long term (current) drug therapy: Secondary | ICD-10-CM

## 2014-01-07 DIAGNOSIS — E860 Dehydration: Secondary | ICD-10-CM | POA: Diagnosis present

## 2014-01-07 DIAGNOSIS — J45909 Unspecified asthma, uncomplicated: Secondary | ICD-10-CM | POA: Diagnosis present

## 2014-01-07 DIAGNOSIS — I059 Rheumatic mitral valve disease, unspecified: Secondary | ICD-10-CM | POA: Diagnosis present

## 2014-01-07 DIAGNOSIS — M25529 Pain in unspecified elbow: Secondary | ICD-10-CM

## 2014-01-07 DIAGNOSIS — Z885 Allergy status to narcotic agent status: Secondary | ICD-10-CM

## 2014-01-07 DIAGNOSIS — Z91018 Allergy to other foods: Secondary | ICD-10-CM

## 2014-01-07 LAB — I-STAT CHEM 8, ED
BUN: 14 mg/dL (ref 6–23)
Calcium, Ion: 1.2 mmol/L (ref 1.12–1.23)
Chloride: 103 mEq/L (ref 96–112)
Creatinine, Ser: 0.9 mg/dL (ref 0.50–1.10)
Glucose, Bld: 124 mg/dL — ABNORMAL HIGH (ref 70–99)
HCT: 44 % (ref 36.0–46.0)
Hemoglobin: 15 g/dL (ref 12.0–15.0)
Potassium: 3.5 mEq/L — ABNORMAL LOW (ref 3.7–5.3)
Sodium: 141 mEq/L (ref 137–147)
TCO2: 23 mmol/L (ref 0–100)

## 2014-01-07 LAB — URINE MICROSCOPIC-ADD ON

## 2014-01-07 LAB — URINALYSIS, ROUTINE W REFLEX MICROSCOPIC
Bilirubin Urine: NEGATIVE
Glucose, UA: NEGATIVE mg/dL
Ketones, ur: NEGATIVE mg/dL
Leukocytes, UA: NEGATIVE
Nitrite: NEGATIVE
Protein, ur: NEGATIVE mg/dL
Specific Gravity, Urine: 1.023 (ref 1.005–1.030)
Urobilinogen, UA: 0.2 mg/dL (ref 0.0–1.0)
pH: 6 (ref 5.0–8.0)

## 2014-01-07 LAB — CK: Total CK: 17439 U/L — ABNORMAL HIGH (ref 7–177)

## 2014-01-07 MED ORDER — SODIUM CHLORIDE 0.9 % IV BOLUS (SEPSIS)
1000.0000 mL | INTRAVENOUS | Status: AC
  Administered 2014-01-07: 1000 mL via INTRAVENOUS

## 2014-01-07 NOTE — ED Provider Notes (Signed)
CSN: [id number]     Arrival date & time 01/07/14  2126 History  This chart was scribed for Shelby FinnerErin O'Malley, PA working with Merrie RoofJohn David Wofford III, MD by Quintella ReichertMatthew Underwood, ED Scribe. This patient was seen in room WTR8/WTR8 and the patient's care was started at 10:17 PM.   Chief Complaint  Patient presents with  . Arm Pain    The history is provided by the patient. No language interpreter was used.    HPI Comments: Shelby MulliganMegan K Norton is a 33 y.o. female who presents to the Emergency Department complaining of bilateral upper arm pain and swelling that began this morning.  Pt states that yesterday she did multiple arm exercises (tricep dips, "firefighter rope" and modified pull-up exercises).  This morning she awoke with swelling and pain to both upper arms, just above the elbow.  She states her pain was initially "slightly worse than usual muscle pain from working out," but it has worsened throughout the day.  She reports minimal pain when still, but 4-5/10 pain with extension or flexion.  She is having difficulty straightening her arms due to pain.  She states she thinks that her arms appear visibly more swollen than usual, "almost 2 more inches around than normal."   She reports that she called her trainer and was told that she should not be in this amount of pain based on the intensity of her training yesterday, and that she should be checked for rhabdomyolysis.  She denies fever, nausea, vomiting, discolored urine, neck pain or back pain.   Past Medical History  Diagnosis Date  . Migraines   . Asthma   . Mitral valve prolapse     Past Surgical History  Procedure Laterality Date  . Nasal septum surgery    . Tonsillectomy      Family History  Problem Relation Age of Onset  . Diabetes Mother   . Hypertension Mother   . Diabetes Father   . Hypertension Father   . Cancer Other   . Stroke Other     History  Substance Use Topics  . Smoking status: Never Smoker   . Smokeless tobacco: Not  on file  . Alcohol Use: Yes     Comment: occ    OB History   Grav Para Term Preterm Abortions TAB SAB Ect Mult Living                   Review of Systems  Constitutional: Negative for fever.  Gastrointestinal: Negative for nausea and vomiting.  Genitourinary:       No urine discoloration  Musculoskeletal: Positive for myalgias (bilateral upper arms). Negative for back pain and neck pain.       Swelling to bilateral upper arms  All other systems reviewed and are negative.      Allergies  Other; Codeine; and Hydrocodone  Home Medications   No current outpatient prescriptions on file. BP 132/92  Pulse 84  Temp(Src) 98.2 F (36.8 C) (Oral)  Resp 14  SpO2 100%  LMP 12/16/2013  Physical Exam  Nursing note and vitals reviewed. Constitutional: She is oriented to person, place, and time. She appears well-developed and well-nourished.  HENT:  Head: Normocephalic and atraumatic.  Eyes: EOM are normal.  Neck: Normal range of motion.  Cardiovascular: Normal rate.   Pulmonary/Chest: Effort normal.  Musculoskeletal:  Mild edema to bilateral upper arms.  Tenderness to distal aspect of both biceps.  No erythema or warmth.  No ecchymosis.  Pain with full  extension of arms bilaterally.  Full ROM right and left shoulder.  Grip strength 5/5.  No forearm tenderness.  Radial pulses 2+.  Normal sensation to light touch.  Neurological: She is alert and oriented to person, place, and time.  Skin: Skin is warm and dry.  Psychiatric: She has a normal mood and affect. Her behavior is normal.    ED Course  Procedures (including critical care time)  DIAGNOSTIC STUDIES: Oxygen Saturation is 100% on room air, normal by my interpretation.    COORDINATION OF CARE: 10:25 PM-Discussed treatment plan which includes IV fluids and labs with pt at bedside and pt agreed to plan.     Labs Review Labs Reviewed  URINALYSIS, ROUTINE W REFLEX MICROSCOPIC - Abnormal; Notable for the following:     Hgb urine dipstick TRACE (*)    All other components within normal limits  CK - Abnormal; Notable for the following:    Total CK 17439 (*)    All other components within normal limits  CBC WITH DIFFERENTIAL - Abnormal; Notable for the following:    WBC 12.8 (*)    Neutro Abs 8.1 (*)    All other components within normal limits  I-STAT CHEM 8, ED - Abnormal; Notable for the following:    Potassium 3.5 (*)    Glucose, Bld 124 (*)    All other components within normal limits  URINE MICROSCOPIC-ADD ON  CBC  BASIC METABOLIC PANEL  CK    Imaging Review No results found.    EKG Interpretation None      MDM   Final diagnoses:  Rhabdomyolysis    pt is a 33yo female with no significant PMH c/o bilateral upper arm pain and swelling that started this morning. Pt presented to ED with concern for rhabdomyolysis as trainer advised pt her symptoms could suggest something more serious than normal pain after a workout.   Pt appears well, non-toxic. No evidence of cellulitis in upper arms. No ecchymosis. UE are neurovascularly in tact. Pt has not noticed change in urination habits or color of urine.  Pt started on 1L IV fluids.   Labs:  Chem-8:  Mild hypokalemia 3.5, Cr and BUN-normal UA: trace Hgb, RBC: 0-2 CK: 17439, indicative of rhabdomylysis.    Discussed pt with Dr. Loretha Stapler. Will admit pt for continued IV fluids to help prevent renal injury.  Renal function appears to be normal at this time. Will start pt on maintenance fluids.  Consulted with Dr. Julian Reil. Will admit pt to obs, med-surg.   I personally performed the services described in this documentation, which was scribed in my presence. The recorded information has been reviewed and is accurate.    Shelby Finner, PA-C 01/08/14 [phone]

## 2014-01-07 NOTE — ED Notes (Signed)
Pt states that she woke up this am with bilateral arm pain; pt reports swelling to both arms above elbows; pt states that she is working out but nothing different than she has been doing over the last few months; pt states that the pain is worse upon extension of her arms; pt states that the has added hot yoga in the last 2 weeks

## 2014-01-08 DIAGNOSIS — M25529 Pain in unspecified elbow: Secondary | ICD-10-CM

## 2014-01-08 DIAGNOSIS — R252 Cramp and spasm: Secondary | ICD-10-CM

## 2014-01-08 DIAGNOSIS — M6282 Rhabdomyolysis: Secondary | ICD-10-CM

## 2014-01-08 LAB — CBC WITH DIFFERENTIAL/PLATELET
Basophils Absolute: 0 10*3/uL (ref 0.0–0.1)
Basophils Relative: 0 % (ref 0–1)
Eosinophils Absolute: 0.2 10*3/uL (ref 0.0–0.7)
Eosinophils Relative: 1 % (ref 0–5)
HCT: 42.4 % (ref 36.0–46.0)
Hemoglobin: 14.6 g/dL (ref 12.0–15.0)
Lymphocytes Relative: 28 % (ref 12–46)
Lymphs Abs: 3.5 10*3/uL (ref 0.7–4.0)
MCH: 29.5 pg (ref 26.0–34.0)
MCHC: 34.4 g/dL (ref 30.0–36.0)
MCV: 85.7 fL (ref 78.0–100.0)
Monocytes Absolute: 0.9 10*3/uL (ref 0.1–1.0)
Monocytes Relative: 7 % (ref 3–12)
Neutro Abs: 8.1 10*3/uL — ABNORMAL HIGH (ref 1.7–7.7)
Neutrophils Relative %: 64 % (ref 43–77)
Platelets: 301 10*3/uL (ref 150–400)
RBC: 4.95 MIL/uL (ref 3.87–5.11)
RDW: 12.3 % (ref 11.5–15.5)
WBC: 12.8 10*3/uL — ABNORMAL HIGH (ref 4.0–10.5)

## 2014-01-08 LAB — CBC
HEMATOCRIT: 36.5 % (ref 36.0–46.0)
Hemoglobin: 12.5 g/dL (ref 12.0–15.0)
MCH: 29.4 pg (ref 26.0–34.0)
MCHC: 34.2 g/dL (ref 30.0–36.0)
MCV: 85.9 fL (ref 78.0–100.0)
PLATELETS: 262 10*3/uL (ref 150–400)
RBC: 4.25 MIL/uL (ref 3.87–5.11)
RDW: 12.5 % (ref 11.5–15.5)
WBC: 10.6 10*3/uL — ABNORMAL HIGH (ref 4.0–10.5)

## 2014-01-08 LAB — BASIC METABOLIC PANEL
BUN: 11 mg/dL (ref 6–23)
CO2: 21 meq/L (ref 19–32)
CREATININE: 0.73 mg/dL (ref 0.50–1.10)
Calcium: 8.4 mg/dL (ref 8.4–10.5)
Chloride: 107 mEq/L (ref 96–112)
GFR calc Af Amer: >90 mL/min (ref >90)
GFR calc non Af Amer: >90 mL/min (ref >90)
Glucose, Bld: 149 mg/dL — ABNORMAL HIGH (ref 70–99)
Potassium: 4 mEq/L (ref 3.7–5.3)
Sodium: 140 mEq/L (ref 137–147)

## 2014-01-08 LAB — CK: CK TOTAL: 21700 U/L — AB (ref 7–177)

## 2014-01-08 LAB — PREGNANCY, URINE: Preg Test, Ur: NEGATIVE

## 2014-01-08 MED ORDER — SODIUM CHLORIDE 0.9 % IV SOLN
INTRAVENOUS | Status: DC
  Administered 2014-01-08: 01:00:00 via INTRAVENOUS

## 2014-01-08 MED ORDER — SODIUM CHLORIDE 0.9 % IV SOLN
INTRAVENOUS | Status: AC
Start: 2014-01-08 — End: 2014-01-09
  Administered 2014-01-08 (×2): via INTRAVENOUS

## 2014-01-08 MED ORDER — ONDANSETRON HCL 4 MG/2ML IJ SOLN
4.0000 mg | Freq: Four times a day (QID) | INTRAMUSCULAR | Status: DC | PRN
  Administered 2014-01-08 (×3): 4 mg via INTRAVENOUS
  Filled 2014-01-08 (×3): qty 2

## 2014-01-08 MED ORDER — SODIUM CHLORIDE 0.9 % IV SOLN: 1000.0000 mL | INTRAVENOUS | Status: DC

## 2014-01-08 MED ORDER — OXYCODONE-ACETAMINOPHEN 5-325 MG PO TABS
1.0000 | ORAL_TABLET | Freq: Four times a day (QID) | ORAL | Status: DC | PRN
  Administered 2014-01-08 (×2): 1 via ORAL
  Administered 2014-01-08: 2 via ORAL
  Filled 2014-01-08: qty 1
  Filled 2014-01-08: qty 2
  Filled 2014-01-08: qty 1

## 2014-01-08 MED ORDER — SODIUM CHLORIDE 0.9 % IV SOLN: 1000.0000 mL | Freq: Once | INTRAVENOUS | Status: DC

## 2014-01-08 MED ORDER — LORATADINE 10 MG PO TABS
10.0000 mg | ORAL_TABLET | Freq: Every day | ORAL | Status: DC
Start: 2014-01-08 — End: 2014-01-11
  Administered 2014-01-08 – 2014-01-11 (×4): 10 mg via ORAL
  Filled 2014-01-08 (×4): qty 1

## 2014-01-08 MED ORDER — OXYCODONE-ACETAMINOPHEN 5-325 MG PO TABS
1.0000 | ORAL_TABLET | Freq: Four times a day (QID) | ORAL | Status: DC | PRN
  Administered 2014-01-08: 1 via ORAL
  Filled 2014-01-08: qty 1

## 2014-01-08 MED ORDER — ENOXAPARIN SODIUM 40 MG/0.4ML ~~LOC~~ SOLN
40.0000 mg | SUBCUTANEOUS | Status: DC
  Administered 2014-01-08: 40 mg via SUBCUTANEOUS
  Filled 2014-01-08 (×2): qty 0.4

## 2014-01-08 MED ORDER — ACETAMINOPHEN 500 MG PO TABS
1000.0000 mg | ORAL_TABLET | Freq: Four times a day (QID) | ORAL | Status: DC | PRN
  Administered 2014-01-08: 1000 mg via ORAL
  Filled 2014-01-08: qty 2

## 2014-01-08 NOTE — ED Provider Notes (Signed)
Medical screening examination/treatment/procedure(s) were conducted as a shared visit with non-physician practitioner(s) and myself.  I personally evaluated the patient during the encounter.   EKG Interpretation None      33 yo female who presents with bilateral arm pain and swelling in setting of working out yesterday. States arms hurt more than she would expect from the workout she performed.  On exam, well appearing, nontoxic, normal perfusion, normal respiratory effort, abd soft and nontender, bilateral distal biceps tender to palpation without obvious swelling with intact distal pulses and motor.  CK indicative of rhabdo.  Given IV fluids.  Admit.   Clinical Impression: 1. Rhabdomyolysis       Candyce ChurnJohn David Laira Penninger III, MD 01/08/14 1158

## 2014-01-08 NOTE — Progress Notes (Signed)
UR completed. Patient changed to inpatient- requiring IVF @ 150cc/hr  

## 2014-01-08 NOTE — H&P (Signed)
[facility] History and Physical  [name] WUJ:[id number] DOB: 10/22/1980 DOA: 01/07/2014  Referring physician: EDP PCP: PROVIDER NOT IN SYSTEM   Chief Complaint: Arm pain   HPI: [name] is a 33 y.o. female who presents to the ED with 1 day history of arm pain.  Symptoms onset this morning in the context of doing exercise yesterday, located in bilateral arms, associated with bilateral upper arm swelling.  Initially was "slightly worse than usual" muscle pain from working out but has worsened throughout the day.  Review of Systems: Systems reviewed.  As above, otherwise negative  Past Medical History  Diagnosis Date  . Migraines   . Asthma   . Mitral valve prolapse    Past Surgical History  Procedure Laterality Date  . Nasal septum surgery    . Tonsillectomy     Social History:  reports that she has never smoked. She does not have any smokeless tobacco history on file. She reports that she drinks alcohol. She reports that she does not use illicit drugs.  Allergies  Allergen Reactions  . Other Anaphylaxis    hazelnuts  . Codeine Nausea And Vomiting  . Hydrocodone Nausea And Vomiting and Swelling    Family History  Problem Relation Age of Onset  . Diabetes Mother   . Hypertension Mother   . Diabetes Father   . Hypertension Father   . Cancer Other   . Stroke Other      Prior to Admission medications   Medication Sig Start Date End Date Taking? Authorizing Provider  acetaminophen (TYLENOL) 500 MG tablet Take 1,000 mg by mouth every 6 (six) hours as needed for moderate pain.   Yes Historical Provider, MD  aspirin-acetaminophen-caffeine (EXCEDRIN EXTRA STRENGTH) [phone] MG per tablet Take 1 tablet by mouth every 6 (six) hours as needed for pain (pain).   Yes Historical Provider, MD  cetirizine (ZYRTEC) 10 MG tablet Take 10 mg by mouth daily.     Yes Historical Provider, MD  Multiple Vitamins-Minerals (MULTIVITAMIN WITH MINERALS) tablet Take 1 tablet by  mouth daily.   Yes Historical Provider, MD  Probiotic Product (PROBIOTIC DAILY PO) Take 1 capsule by mouth daily.   Yes Historical Provider, MD   Physical Exam: Filed Vitals:   01/07/14 2154  BP: 132/92  Pulse: 84  Temp: 98.2 F (36.8 C)  Resp: 14    BP 132/92  Pulse 84  Temp(Src) 98.2 F (36.8 C) (Oral)  Resp 14  SpO2 100%  LMP 12/16/2013  General Appearance:    Alert, oriented, no distress, appears stated age  Head:    Normocephalic, atraumatic  Eyes:    PERRL, EOMI, sclera non-icteric        Nose:   Nares without drainage or epistaxis. Mucosa, turbinates normal  Throat:   Moist mucous membranes. Oropharynx without erythema or exudate.  Neck:   Supple. No carotid bruits.  No thyromegaly.  No lymphadenopathy.   Back:     No CVA tenderness, no spinal tenderness  Lungs:     Clear to auscultation bilaterally, without wheezes, rhonchi or rales  Chest wall:    No tenderness to palpitation  Heart:    Regular rate and rhythm without murmurs, gallops, rubs  Abdomen:     Soft, non-tender, nondistended, normal bowel sounds, no organomegaly  Genitalia:    deferred  Rectal:    deferred  Extremities:   No clubbing, cyanosis or edema.  Pulses:   2+ and symmetric all extremities  Skin:  Skin color, texture, turgor normal, no rashes or lesions  Lymph nodes:   Cervical, supraclavicular, and axillary nodes normal  Neurologic:   CNII-XII intact. Normal strength, sensation and reflexes      throughout    Labs on Admission:  Basic Metabolic Panel:  Recent Labs Lab 01/07/14 2307  NA 141  K 3.5*  CL 103  GLUCOSE 124*  BUN 14  CREATININE 0.90   Liver Function Tests: No results found for this basename: AST, ALT, ALKPHOS, BILITOT, PROT, ALBUMIN,  in the last 168 hours No results found for this basename: LIPASE, AMYLASE,  in the last 168 hours No results found for this basename: AMMONIA,  in the last 168 hours CBC:  Recent Labs Lab 01/07/14 2255 01/07/14 2307  WBC 12.8*  --    NEUTROABS 8.1*  --   HGB 14.6 15.0  HCT 42.4 44.0  MCV 85.7  --   PLT 301  --    Cardiac Enzymes:  Recent Labs Lab 01/07/14 2255  CKTOTAL 16109*    BNP (last 3 results) No results found for this basename: PROBNP,  in the last 8760 hours CBG: No results found for this basename: GLUCAP,  in the last 168 hours  Radiological Exams on Admission: No results found.  EKG: Independently reviewed.  Assessment/Plan Principal Problem:   Rhabdomyolysis   1. Rhabodmyolysis - CK > 17000 indicative of rhabdomyolysis, getting IVF 1L bolus and now at 150 cc/hr to prevent renal injury, does have trace HGB in urine at this time, repeat CK in AM to ensure downtrend.  Tylenol ordered initially for pain.  Will also order norco in case something stronger is needed.   Code Status: Full Code  Family Communication: No family in room Disposition Plan: Admit to obs   Time spent: 30 min  GARDNER, JARED M. [facility] Pager [phone]  If 7AM-7PM, please contact the day team taking care of the patient [email or web address] Password [email or web address] 01/08/2014, 12:40 AM

## 2014-01-08 NOTE — ED Provider Notes (Signed)
Medical screening examination/treatment/procedure(s) were conducted as a shared visit with non-physician practitioner(s) and myself.  I personally evaluated the patient during the encounter.   Please see my separate note.     Honore Wipperfurth David Brently Voorhis III, MD 01/08/14 1243 

## 2014-01-08 NOTE — ED Notes (Signed)
[facility], pt ready for transport

## 2014-01-08 NOTE — Progress Notes (Signed)
Patient Demographics  Shelby Norton, is a 33 y.o. female, DOB - [birth year], ZOX:[id number]  Admit date - 01/07/2014   Admitting Physician Hillary Bow, DO  Outpatient Primary MD for the patient is PROVIDER NOT IN SYSTEM  LOS - 1   Chief Complaint  Patient presents with  . Arm Pain        Assessment & Plan    1. Intense exercise induced Rhabdomyolysis - dehydration, pain and discomfort much improved, monitor CK levels  along with creatinine. She claims is her third episode of such symptoms. Have requested her to follow with rheumatology post discharge. Check urine pregnancy test.   Lab Results  Component Value Date   CKTOTAL 81191* 01/07/2014     Code Status: Full  Family Communication: None present  Disposition Plan:  Home   Procedures    Consults      Medications  Scheduled Meds: . loratadine  10 mg Oral Daily   Continuous Infusions: . sodium chloride 150 mL/hr at 01/08/14 0050   PRN Meds:.acetaminophen, ondansetron (ZOFRAN) IV, oxyCODONE-acetaminophen  DVT Prophylaxis  Lovenox    Lab Results  Component Value Date   PLT 262 01/08/2014    Antibiotics     Anti-infectives   None          Subjective:   Shelby Norton today has, No headache, No chest pain, No abdominal pain - No Nausea, No new weakness tingling or numbness, No Cough - SOB. Arm pain and body ache much improved  Objective:   Filed Vitals:   01/07/14 2154 01/08/14 0119 01/08/14 0550  BP: 132/92 132/88 131/74  Pulse: 84 88 83  Temp: 98.2 F (36.8 C) 98.8 F (37.1 C) 98.7 F (37.1 C)  TempSrc: Oral Oral Oral  Resp: 14 18 20   Height:  5' (1.524 m)   Weight:  70.67 kg (155 lb 12.8 oz)   SpO2: 100% 100% 100%    Wt Readings from Last 3 Encounters:  01/08/14 70.67 kg (155 lb 12.8 oz)    06/30/13 68.493 kg (151 lb)  03/07/13 67.586 kg (149 lb)     Intake/Output Summary (Last 24 hours) at 01/08/14 0934 Last data filed at 01/08/14 0500  Gross per 24 hour  Intake    625 ml  Output      0 ml  Net    625 ml     Physical Exam  Awake Alert, Oriented X 3, No new F.N deficits, Normal affect [place].AT,PERRAL Supple Neck,No JVD, No cervical lymphadenopathy appriciated.  Symmetrical Chest wall movement, Good air movement bilaterally, CTAB RRR,No Gallops,Rubs or new Murmurs, No Parasternal Heave +ve B.Sounds, Abd Soft, Non tender, No organomegaly appriciated, No rebound - guarding or rigidity. No Cyanosis, Clubbing or edema, No new Rash or bruise   Both arms are non tender and soft, and range of motion, no loss of sensation in hands or fingers. No pain in the muscles with joint movements.    Data Review   Micro Results No results found for this or any previous visit (from the past 240 hour(s)).  Radiology Reports No results found.  CBC  Recent Labs Lab 01/07/14 2255 01/07/14 2307 01/08/14 0452  WBC 12.8*  --  10.6*  HGB 14.6 15.0 12.5  HCT 42.4 44.0 36.5  PLT 301  --  262  MCV 85.7  --  85.9  MCH 29.5  --  29.4  MCHC 34.4  --  34.2  RDW 12.3  --  12.5  LYMPHSABS 3.5  --   --   MONOABS 0.9  --   --   EOSABS 0.2  --   --   BASOSABS 0.0  --   --     Chemistries   Recent Labs Lab 01/07/14 2307 01/08/14 0452  NA 141 140  K 3.5* 4.0  CL 103 107  CO2  --  21  GLUCOSE 124* 149*  BUN 14 11  CREATININE 0.90 0.73  CALCIUM  --  8.4   ------------------------------------------------------------------------------------------------------------------ estimated creatinine clearance is 90.3 ml/min (by C-G formula based on Cr of 0.73). ------------------------------------------------------------------------------------------------------------------ No results found for this basename: HGBA1C,  in the last 72  hours ------------------------------------------------------------------------------------------------------------------ No results found for this basename: CHOL, HDL, LDLCALC, TRIG, CHOLHDL, LDLDIRECT,  in the last 72 hours ------------------------------------------------------------------------------------------------------------------ No results found for this basename: TSH, T4TOTAL, FREET3, T3FREE, THYROIDAB,  in the last 72 hours ------------------------------------------------------------------------------------------------------------------ No results found for this basename: VITAMINB12, FOLATE, FERRITIN, TIBC, IRON, RETICCTPCT,  in the last 72 hours  Coagulation profile No results found for this basename: INR, PROTIME,  in the last 168 hours  No results found for this basename: DDIMER,  in the last 72 hours  Cardiac Enzymes No results found for this basename: CK, CKMB, TROPONINI, MYOGLOBIN,  in the last 168 hours ------------------------------------------------------------------------------------------------------------------ No components found with this basename: POCBNP,      Time Spent in minutes   35   Markeis Allman K M.D on 01/08/2014 at 9:34 AM  Between 7am to 7pm - Pager - [phone]  After 7pm go to [email or web address] - password [email or web address]  And look for the night coverage person covering for me after hours  [facility] Office  [phone]

## 2014-01-09 DIAGNOSIS — R252 Cramp and spasm: Secondary | ICD-10-CM

## 2014-01-09 DIAGNOSIS — M25529 Pain in unspecified elbow: Secondary | ICD-10-CM

## 2014-01-09 LAB — COMPREHENSIVE METABOLIC PANEL
ALT: 128 U/L — ABNORMAL HIGH (ref 0–35)
AST: 282 U/L — AB (ref 0–37)
Albumin: 3.3 g/dL — ABNORMAL LOW (ref 3.5–5.2)
Alkaline Phosphatase: 51 U/L (ref 39–117)
BUN: 10 mg/dL (ref 6–23)
CALCIUM: 8.6 mg/dL (ref 8.4–10.5)
CO2: 25 mEq/L (ref 19–32)
CREATININE: 0.81 mg/dL (ref 0.50–1.10)
Chloride: 104 mEq/L (ref 96–112)
GFR calc Af Amer: >90 mL/min (ref >90)
GFR calc non Af Amer: >90 mL/min (ref >90)
Glucose, Bld: 108 mg/dL — ABNORMAL HIGH (ref 70–99)
Potassium: 4.1 mEq/L (ref 3.7–5.3)
SODIUM: 139 meq/L (ref 137–147)
Total Bilirubin: 0.7 mg/dL (ref 0.3–1.2)
Total Protein: 5.4 g/dL — ABNORMAL LOW (ref 6.0–8.3)

## 2014-01-09 LAB — RAPID URINE DRUG SCREEN, HOSP PERFORMED
Amphetamines: NOT DETECTED
BARBITURATES: NOT DETECTED
BENZODIAZEPINES: NOT DETECTED
COCAINE: NOT DETECTED
Opiates: NOT DETECTED
TETRAHYDROCANNABINOL: NOT DETECTED

## 2014-01-09 LAB — CK: Total CK: 18355 U/L — ABNORMAL HIGH (ref 7–177)

## 2014-01-09 MED ORDER — MORPHINE SULFATE 2 MG/ML IJ SOLN
1.0000 mg | INTRAMUSCULAR | Status: DC | PRN
  Administered 2014-01-09 – 2014-01-11 (×9): 1 mg via INTRAVENOUS
  Filled 2014-01-09 (×9): qty 1

## 2014-01-09 MED ORDER — DOCUSATE SODIUM 100 MG PO CAPS
100.0000 mg | ORAL_CAPSULE | Freq: Two times a day (BID) | ORAL | Status: DC
  Administered 2014-01-09 – 2014-01-11 (×5): 100 mg via ORAL
  Filled 2014-01-09 (×6): qty 1

## 2014-01-09 NOTE — Progress Notes (Signed)
Pt with an episode of emesis.  Already had zofran.  Her pain medication make her have severe nausea and vomiting.  Will leave note for day shift to address this with MD and possible change medications.  Pt would also like to take a shower. Want to know why she has gained about 7 pounds since arrival here.

## 2014-01-09 NOTE — Progress Notes (Addendum)
[facility] PROGRESS NOTE  Shelby Norton ONG:[id number] DOB: June 01, 1981 DOA: 01/07/2014 PCP: PROVIDER NOT IN SYSTEM  Brief narrative: 33 y.o. female who presents to the [facility] ED 01/08/2104 with 1 day history of arm pain. Pt was subsequently fund to have rhabdomyolysis with CK levels in 44010 range.   Assessment/Plan:  Principal Problem:   Rhabdomyolysis - possibly due aggressive exercise; check UDS (just to make sue no drug abuse) even though now it has been 2 days with IV fluids - spoke with renal, recommended IV fluids and repeat Ck in am; if worsens will call for an official consult  - creatinine WNL - daily weight and strict intake and output - pain management with morphine 1 mg every 4 hours IV PRN - antiemetics PRN   Code Status: full code Family Communication: mother at the bedside  Disposition Plan: home when stable  Manson Passey, MD  [facility] Pager [phone]  If 7PM-7AM, please contact night-coverage [email or web address] Password [email or web address] 01/09/2014, 8:36 AM   LOS: 2 days   Consultants:  Nephrology - phone call only  Procedures:  None   Antibiotics:  None   HPI/Subjective: Voiding freely but not much urine output.   Objective: Filed Vitals:   01/08/14 0550 01/08/14 1523 01/08/14 2157 01/09/14 0616  BP: 131/74 120/82 134/71 107/69  Pulse: 83 98 88 69  Temp: 98.7 F (37.1 C) 97.6 F (36.4 C) 97.9 F (36.6 C) 97.4 F (36.3 C)  TempSrc: Oral Oral Oral Oral  Resp: 20 18 20 16   Height:      Weight:      SpO2: 100% 100% 100% 99%    Intake/Output Summary (Last 24 hours) at 01/09/14 0836 Last data filed at 01/09/14 0253  Gross per 24 hour  Intake    240 ml  Output   1002 ml  Net   -762 ml    Exam:   General:  Pt is alert, follows commands appropriately, not in acute distress  Cardiovascular: Regular rate and rhythm, S1/S2, no murmurs, no rubs, no gallops  Respiratory: Clear to auscultation bilaterally, no wheezing, no crackles, no  rhonchi  Abdomen: Soft, non tender, non distended, bowel sounds present, no guarding  Extremities: No edema, pulses DP and PT palpable bilaterally  Neuro: Grossly nonfocal  Data Reviewed: Basic Metabolic Panel:  Recent Labs Lab 01/07/14 2307 01/08/14 0452 01/09/14 0522  NA 141 140 139  K 3.5* 4.0 4.1  CL 103 107 104  CO2  --  21 25  GLUCOSE 124* 149* 108*  BUN 14 11 10   CREATININE 0.90 0.73 0.81  CALCIUM  --  8.4 8.6   Liver Function Tests:  Recent Labs Lab 01/09/14 0522  AST 282*  ALT 128*  ALKPHOS 51  BILITOT 0.7  PROT 5.4*  ALBUMIN 3.3*   No results found for this basename: LIPASE, AMYLASE,  in the last 168 hours No results found for this basename: AMMONIA,  in the last 168 hours CBC:  Recent Labs Lab 01/07/14 2255 01/07/14 2307 01/08/14 0452  WBC 12.8*  --  10.6*  NEUTROABS 8.1*  --   --   HGB 14.6 15.0 12.5  HCT 42.4 44.0 36.5  MCV 85.7  --  85.9  PLT 301  --  262   Cardiac Enzymes:  Recent Labs Lab 01/07/14 2255 01/08/14 0811 01/09/14 0522  CKTOTAL 44034* 21700* 18355*   BNP: No components found with this basename: POCBNP,  CBG: No results found for this basename: GLUCAP,  in the  last 168 hours  No results found for this or any previous visit (from the past 240 hour(s)).   Studies: No results found.  Scheduled Meds: . loratadine  10 mg Oral Daily   Continuous Infusions: . sodium chloride 100 mL/hr at 01/08/14 2229

## 2014-01-10 LAB — CK: Total CK: 12639 U/L — ABNORMAL HIGH (ref 7–177)

## 2014-01-10 MED ORDER — SODIUM CHLORIDE 0.9 % IV SOLN
INTRAVENOUS | Status: DC
  Administered 2014-01-10 (×2): via INTRAVENOUS

## 2014-01-10 NOTE — Progress Notes (Signed)
[facility] PROGRESS NOTE  [name] ZOX:[id number] DOB: 26-Dec-1980 DOA: 01/07/2014 PCP: PROVIDER NOT IN SYSTEM  Brief narrative: 33 y.o. female who presents to the [facility] ED 01/08/2104 with 1 day history of arm pain. Pt was subsequently fund to have rhabdomyolysis with CK levels in 81191 range.  Assessment/Plan:   Principal Problem:  Rhabdomyolysis  - possibly due aggressive exercise; UDS done 3/21 negative  - spoke with renal, recommended IV fluids and repeat Ck daily; CK down to 12000 range (from 18 K) - creatinine WNL  - pain management with morphine 1 mg every 4 hours IV PRN  - antiemetics PRN   Code Status: full code  Family Communication: mother at the bedside  Disposition Plan: home when stable   Consultants:  Nephrology - phone call only Procedures:  None  Antibiotics:  None    Manson Passey, MD  [facility] Pager [phone]  If 7PM-7AM, please contact night-coverage [email or web address] Password [email or web address] 01/10/2014, 12:59 PM   LOS: 3 days    HPI/Subjective: No acute overnight events. Muscle pain better.   Objective: Filed Vitals:   01/09/14 0616 01/09/14 1400 01/09/14 2231 01/10/14 0538  BP: 107/69 126/80 131/85 106/70  Pulse: 69 83 92 76  Temp: 97.4 F (36.3 C) 98.2 F (36.8 C) 98 F (36.7 C) 98 F (36.7 C)  TempSrc: Oral Oral Oral Oral  Resp: 16 18 18 18   Height:      Weight:    71.8 kg (158 lb 4.6 oz)  SpO2: 99% 99% 100% 98%    Intake/Output Summary (Last 24 hours) at 01/10/14 1259 Last data filed at 01/10/14 1100  Gross per 24 hour  Intake    240 ml  Output   1950 ml  Net  -1710 ml    Exam:   General:  Pt is alert, follows commands appropriately, not in acute distress  Cardiovascular: Regular rate and rhythm, S1/S2, no murmurs, no rubs, no gallops  Respiratory: Clear to auscultation bilaterally, no wheezing, no crackles, no rhonchi  Abdomen: Soft, non tender, non distended, bowel sounds present, no guarding  Extremities: No  edema, pulses DP and PT palpable bilaterally  Neuro: Grossly nonfocal  Data Reviewed: Basic Metabolic Panel:  Recent Labs Lab 01/07/14 2307 01/08/14 0452 01/09/14 0522  NA 141 140 139  K 3.5* 4.0 4.1  CL 103 107 104  CO2  --  21 25  GLUCOSE 124* 149* 108*  BUN 14 11 10   CREATININE 0.90 0.73 0.81  CALCIUM  --  8.4 8.6   Liver Function Tests:  Recent Labs Lab 01/09/14 0522  AST 282*  ALT 128*  ALKPHOS 51  BILITOT 0.7  PROT 5.4*  ALBUMIN 3.3*   No results found for this basename: LIPASE, AMYLASE,  in the last 168 hours No results found for this basename: AMMONIA,  in the last 168 hours CBC:  Recent Labs Lab 01/07/14 2255 01/07/14 2307 01/08/14 0452  WBC 12.8*  --  10.6*  NEUTROABS 8.1*  --   --   HGB 14.6 15.0 12.5  HCT 42.4 44.0 36.5  MCV 85.7  --  85.9  PLT 301  --  262   Cardiac Enzymes:  Recent Labs Lab 01/07/14 2255 01/08/14 0811 01/09/14 0522 01/10/14 0510  CKTOTAL 21308* 21700* 18355* 65784*   BNP: No components found with this basename: POCBNP,  CBG: No results found for this basename: GLUCAP,  in the last 168 hours  No results found for this or any previous visit (from  the past 240 hour(s)).   Studies: No results found.  Scheduled Meds: . docusate sodium  100 mg Oral BID  . loratadine  10 mg Oral Daily   Continuous Infusions: . sodium chloride

## 2014-01-11 LAB — CK: Total CK: 6344 U/L — ABNORMAL HIGH (ref 7–177)

## 2014-01-11 MED ORDER — OXYCODONE-ACETAMINOPHEN 5-325 MG PO TABS
1.0000 | ORAL_TABLET | ORAL | Status: DC | PRN
Start: 2014-01-11 — End: 2015-04-08

## 2014-01-11 MED ORDER — ONDANSETRON HCL 4 MG PO TABS: 4.0000 mg | ORAL_TABLET | Freq: Three times a day (TID) | ORAL | Status: DC | PRN

## 2014-01-11 NOTE — Progress Notes (Signed)
Discharge instructions given to pt, verbalized understanding. Left the unit in stable condition. 

## 2014-01-11 NOTE — Discharge Summary (Addendum)
Physician Discharge Summary  Shelby Norton ZOX:[id number] DOB: Aug 02, 1981 DOA: 01/07/2014  PCP: PROVIDER NOT IN SYSTEM  Admit date: 01/07/2014 Discharge date: 01/11/2014  Recommendations for Outpatient Follow-up:  1. Please follow up with PCP in 1 week to have creatinine kinase checked. You CK level on admission was in 20,000 range and around 6,000 on discharge. It has improved with IV fluids.  Discharge Diagnoses:  Principal Problem:   Rhabdomyolysis    Discharge Condition: medically stable for discharge   Diet recommendation: as tolerated   History of present illness:  33 y.o. female who presents to the [facility] ED 01/08/2104 with 1 day history of arm pain. Pt was subsequently fund to have rhabdomyolysis with CK levels in 81191 range.   Assessment/Plan:   Principal Problem:  Rhabdomyolysis  - possibly due aggressive exercise; UDS done 3/21 negative  - spoke with renal, recommended IV fluids and repeat Ck daily; CK down to 12000 range (from 18 K) and then trend down further to 6000 range  - creatinine WNL  - pain management with tramadol outpatient  - antiemetics PRN   Code Status: full code  Family Communication: mother at the bedside   Consultants:  Nephrology - phone call only Procedures:  None  Antibiotics:  None   Signed:  Manson Passey, MD  [facility] 01/11/2014, 11:42 AM  Pager #: [phone]   Discharge Exam: Filed Vitals:   01/11/14 0500  BP: 101/67  Pulse: 68  Temp: 97.6 F (36.4 C)  Resp:    Filed Vitals:   01/10/14 0538 01/10/14 1335 01/10/14 2254 01/11/14 0500  BP: 106/70 126/87 120/73 101/67  Pulse: 76 85 72 68  Temp: 98 F (36.7 C) 98 F (36.7 C) 97.5 F (36.4 C) 97.6 F (36.4 C)  TempSrc: Oral Oral Oral   Resp: 18 18 18    Height:      Weight: 71.8 kg (158 lb 4.6 oz)   71.8 kg (158 lb 4.6 oz)  SpO2: 98% 97% 97% 100%    General: Pt is alert, follows commands appropriately, not in acute distress Cardiovascular: Regular rate and  rhythm, S1/S2 +, no murmurs, no rubs, no gallops Respiratory: Clear to auscultation bilaterally, no wheezing, no crackles, no rhonchi Abdominal: Soft, non tender, non distended, bowel sounds +, no guarding Extremities: no edema, no cyanosis, pulses palpable bilaterally DP and PT Neuro: Grossly nonfocal  Discharge Instructions  Discharge Orders   Future Orders Complete By Expires   Call MD for:  difficulty breathing, headache or visual disturbances  As directed    Call MD for:  persistant dizziness or light-headedness  As directed    Call MD for:  persistant nausea and vomiting  As directed    Call MD for:  severe uncontrolled pain  As directed    Diet - low sodium heart healthy  As directed    Discharge instructions  As directed    Comments:     1. Please follow up with PCP in 1 week to have creatinine kinase checked. You CK level on admission was in 20,000 range and around 6,000 on discharge. It has improved with IV fluids.   Increase activity slowly  As directed        Medication List    STOP taking these medications       acetaminophen 500 MG tablet  Commonly known as:  TYLENOL     EXCEDRIN EXTRA STRENGTH 250-250-65 MG per tablet  Generic drug:  aspirin-acetaminophen-caffeine      TAKE  these medications       cetirizine 10 MG tablet  Commonly known as:  ZYRTEC  Take 10 mg by mouth daily.     multivitamin with minerals tablet  Take 1 tablet by mouth daily.     ondansetron 4 MG tablet  Commonly known as:  ZOFRAN  Take 1 tablet (4 mg total) by mouth every 8 (eight) hours as needed for nausea or vomiting.     Called in tramadol for pain control   PROBIOTIC DAILY PO  Take 1 capsule by mouth daily.          The results of significant diagnostics from this hospitalization (including imaging, microbiology, ancillary and laboratory) are listed below for reference.    Significant Diagnostic Studies: No results found.  Microbiology: No results found for this or  any previous visit (from the past 240 hour(s)).   Labs: Basic Metabolic Panel:  Recent Labs Lab 01/07/14 2307 01/08/14 0452 01/09/14 0522  NA 141 140 139  K 3.5* 4.0 4.1  CL 103 107 104  CO2  --  21 25  GLUCOSE 124* 149* 108*  BUN 14 11 10   CREATININE 0.90 0.73 0.81  CALCIUM  --  8.4 8.6   Liver Function Tests:  Recent Labs Lab 01/09/14 0522  AST 282*  ALT 128*  ALKPHOS 51  BILITOT 0.7  PROT 5.4*  ALBUMIN 3.3*   No results found for this basename: LIPASE, AMYLASE,  in the last 168 hours No results found for this basename: AMMONIA,  in the last 168 hours CBC:  Recent Labs Lab 01/07/14 2255 01/07/14 2307 01/08/14 0452  WBC 12.8*  --  10.6*  NEUTROABS 8.1*  --   --   HGB 14.6 15.0 12.5  HCT 42.4 44.0 36.5  MCV 85.7  --  85.9  PLT 301  --  262   Cardiac Enzymes:  Recent Labs Lab 01/07/14 2255 01/08/14 0811 01/09/14 0522 01/10/14 0510 01/11/14 0445  CKTOTAL [id number]* 21700* 18355* [id number]* 6344*   BNP: BNP (last 3 results) No results found for this basename: PROBNP,  in the last 8760 hours CBG: No results found for this basename: GLUCAP,  in the last 168 hours  Time coordinating discharge: Over 30 minutes

## 2014-01-11 NOTE — Discharge Instructions (Signed)
Rhabdomyolysis °Rhabdomyolysis is the breakdown of muscle fibers due to injury. The injury may come from physical damage to the muscle like an injury but other causes are: °· High fever (hyperthermia). °· Seizures (convulsions). °· Low phosphate levels. °· Diseases of metabolism. °· Heatstroke. °· Drug toxicity. °· Over exertion. °· Alcoholism. °· Muscle is cut off from oxygen (anoxia). °· The squeezing of nerves and blood vessels (compartment syndrome). °Some drugs which may cause the breakdown of muscle are: °· Antibiotics. °· Statins. °· Alcohol. °· Animal toxins. °Myoglobin is a substance which helps muscle use oxygen. When the muscle is damaged, the myoglobin is released into the bloodstream. It is filtered out of the bloodstream by the kidneys. Myoglobin may block up the kidneys. This may cause damage, such as kidney failure. It also breaks down into other damaging toxic parts, which also cause kidney failure.  °SYMPTOMS  °· Dark, red, or tea colored urine. °· Weakness of affected muscles. °· Weight gain from water retention. °· Joint aches and pains. °· Irregular heart from high potassium in the blood. °· Muscle tenderness or aching. °· Generalized weakness. °· Seizures. °· Feeling tired (fatigue). °DIAGNOSIS  °Your caregiver may find muscle tenderness on exam and suspect the problem. Urine tests and blood work can confirm the problem. °TREATMENT     °· Early and aggressive treatment with large amounts of fluids may help prevent kidney failure. °· Water producing medicine (diuretic) may be used to help flush the kidneys. °· High potassium and calcium problems (electrolyte) in your blood may need treatment. °HOME CARE INSTRUCTIONS  °This problem is usually cared for in a hospital. If you are allowed to go home and require dialysis, make sure you keep all appointments for lab work and dialysis. Not doing so could result in death. °Document Released: 09/20/2004 Document Revised: 12/31/2011 Document Reviewed:  04/04/2009 °ExitCare® Patient Information ©2014 [employer]. ° °

## 2014-05-25 DIAGNOSIS — G43909 Migraine, unspecified, not intractable, without status migrainosus: Secondary | ICD-10-CM | POA: Insufficient documentation

## 2014-08-20 DIAGNOSIS — I080 Rheumatic disorders of both mitral and aortic valves: Secondary | ICD-10-CM | POA: Insufficient documentation

## 2015-03-16 DIAGNOSIS — M3500 Sicca syndrome, unspecified: Secondary | ICD-10-CM | POA: Insufficient documentation

## 2015-03-16 DIAGNOSIS — I7300 Raynaud's syndrome without gangrene: Secondary | ICD-10-CM | POA: Insufficient documentation

## 2015-03-17 ENCOUNTER — Telehealth: Payer: Self-pay | Admitting: Family

## 2015-03-17 NOTE — Telephone Encounter (Signed)
Lt mess regarding 6/17 appt at 1:30.

## 2015-03-22 ENCOUNTER — Telehealth: Payer: Self-pay | Admitting: Hematology & Oncology

## 2015-03-22 NOTE — Telephone Encounter (Signed)
I spoke w NEW PATIENT today to remind them of their appointment with Dr. Ennever. Also, advised them to bring all medication bottles and insurance card information. ° °

## 2015-04-07 ENCOUNTER — Telehealth: Payer: Self-pay | Admitting: Hematology & Oncology

## 2015-04-07 NOTE — Telephone Encounter (Signed)
I spoke w NEW PATIENT today to remind them of their appointment with Dr. Ennever. Also, advised them to bring all medication bottles and insurance card information. ° °

## 2015-04-08 ENCOUNTER — Other Ambulatory Visit: Payer: Self-pay | Admitting: Family

## 2015-04-08 ENCOUNTER — Ambulatory Visit: Payer: Medicaid Other

## 2015-04-08 ENCOUNTER — Other Ambulatory Visit (HOSPITAL_BASED_OUTPATIENT_CLINIC_OR_DEPARTMENT_OTHER): Payer: Medicaid Other

## 2015-04-08 ENCOUNTER — Ambulatory Visit (HOSPITAL_BASED_OUTPATIENT_CLINIC_OR_DEPARTMENT_OTHER): Payer: Medicaid Other | Admitting: Family

## 2015-04-08 VITALS — BP 128/85 | HR 70 | Temp 97.5°F | Resp 20 | Wt 153.0 lb

## 2015-04-08 DIAGNOSIS — R599 Enlarged lymph nodes, unspecified: Secondary | ICD-10-CM

## 2015-04-08 DIAGNOSIS — R590 Localized enlarged lymph nodes: Secondary | ICD-10-CM

## 2015-04-08 LAB — CBC WITH DIFFERENTIAL (CANCER CENTER ONLY)
BASO#: 0 10*3/uL (ref 0.0–0.2)
BASO%: 0.5 % (ref 0.0–2.0)
EOS%: 3.2 % (ref 0.0–7.0)
Eosinophils Absolute: 0.3 10*3/uL (ref 0.0–0.5)
HCT: 43.9 % (ref 34.8–46.6)
HGB: 15.4 g/dL (ref 11.6–15.9)
LYMPH#: 3 10*3/uL (ref 0.9–3.3)
LYMPH%: 35.3 % (ref 14.0–48.0)
MCH: 30.1 pg (ref 26.0–34.0)
MCHC: 35.1 g/dL (ref 32.0–36.0)
MCV: 86 fL (ref 81–101)
MONO#: 0.4 10*3/uL (ref 0.1–0.9)
MONO%: 4.4 % (ref 0.0–13.0)
NEUT#: 4.8 10*3/uL (ref 1.5–6.5)
NEUT%: 56.6 % (ref 39.6–80.0)
PLATELETS: 314 10*3/uL (ref 145–400)
RBC: 5.12 10*6/uL (ref 3.70–5.32)
RDW: 11.8 % (ref 11.1–15.7)
WBC: 8.4 10*3/uL (ref 3.9–10.0)

## 2015-04-08 LAB — CHCC SATELLITE - SMEAR

## 2015-04-08 LAB — LACTATE DEHYDROGENASE: LDH: 147 U/L (ref 94–250)

## 2015-04-08 NOTE — Progress Notes (Signed)
Hematology/Oncology Consultation   Name: [name]      MRN: [id number]    Location: Room/bed info not found  Date: 04/08/2015 Time:2:54 PM   REFERRING PHYSICIAN: Iona Hansen, NP  REASON FOR CONSULT: Lymphadenopathy of the head and neck   DIAGNOSIS: Lymphadenopathy of the head and neck   HISTORY OF PRESENT ILLNESS: [name] is a very pleasant 34 yo white female with a history of an enlarged cervical lymph node. I was able to palpate one "marble" sized node on the right neck. She noticed this 2 years ago.  She has muscle pain and weakness now. She states that she has consistent bilateral hip pain.    She has also noticed "puffiness" in her antecubitals and behind the right knee.  After having a sinus surgery in 2014 she developed rhabdomyolysis. She has begun to exhibit symptoms of Raynaud's syndrome.  She was very active participating in running, yoga and weight training. She is unable to any of that now with all the muscle and joint pain.  She has also seen rheumatology. They are still running tests to determine if she may have a metabolic disorder of the muscles. She was negative for Lupus and McArdles.  She has a paternal cousin that has the same symptoms she is exhibiting but unfortunately they have been unable to find the cause.  Her son is having leg and foot cramps at night after playing all day. She states that she also experienced this as a child.  No personal or familial history of cancers of the blood. Her maternal grandmother passed away from metastatic lung cancer.  She has severe dry eyes and mouth so she is going to be tested for Sjogren's syndrome.  She does not smoke or drink.  She denies fever, chills, n/v, cough, rash, dizziness, SOB, chest pain, palpitations, abdominal pain, constipation, diarrhea, blood in urine or stool. She has night sweats occassionally.  No numbness or tingling in her extremities. No new aches or pains.  Her appetite is good and she is staying  hydrated.  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  She has only the one son and he is healthy. No miscarriages.    ROS: All other 10 point review of systems is negative.   PAST MEDICAL HISTORY:   Past Medical History  Diagnosis Date  . Migraines   . Asthma   . Mitral valve prolapse     ALLERGIES: Allergies  Allergen Reactions  . Aspirin Hives  . Other Anaphylaxis    hazelnuts  . Codeine Nausea And Vomiting  . Hydrocodone Nausea And Vomiting and Swelling      MEDICATIONS:  Current Outpatient Prescriptions on File Prior to Visit  Medication Sig Dispense Refill  . cetirizine (ZYRTEC) 10 MG tablet Take 10 mg by mouth daily.      . Multiple Vitamins-Minerals (MULTIVITAMIN WITH MINERALS) tablet Take 1 tablet by mouth daily.    . Probiotic Product (PROBIOTIC DAILY PO) Take 1 capsule by mouth daily.     No current facility-administered medications on file prior to visit.     PAST SURGICAL HISTORY Past Surgical History  Procedure Laterality Date  . Nasal septum surgery    . Tonsillectomy      FAMILY HISTORY: Family History  Problem Relation Age of Onset  . Diabetes Mother   . Hypertension Mother   . Diabetes Father   . Hypertension Father   . Cancer Other   . Stroke Other  SOCIAL HISTORY:  reports that she has never smoked. She does not have any smokeless tobacco history on file. She reports that she drinks alcohol. She reports that she does not use illicit drugs.  PERFORMANCE STATUS: The patient's performance status is 1 - Symptomatic but completely ambulatory  PHYSICAL EXAM: Most Recent Vital Signs: Blood pressure 128/85, pulse 70, temperature 97.5 F (36.4 C), temperature source Oral, resp. rate 20, weight 153 lb (69.4 kg). BP 128/85 mmHg  Pulse 70  Temp(Src) 97.5 F (36.4 C) (Oral)  Resp 20  Wt 153 lb (69.4 kg)  General Appearance:    Alert, cooperative, no distress, appears stated age  Head:    Normocephalic, without obvious  abnormality, atraumatic  Eyes:    PERRL, conjunctiva/corneas clear, EOM's intact, fundi    benign, both eyes        Throat:   Lips, mucosa, and tongue normal; teeth and gums normal  Neck:   Supple, symmetrical, trachea midline, no adenopathy;    thyroid: no carotid, bruit or JVD; has one palpable posterior cervical node on the right.   Back:     Symmetric, no curvature, ROM normal, no CVA tenderness  Lungs:     Clear to auscultation bilaterally, respirations unlabored  Chest Wall:    No tenderness or deformity   Heart:    Regular rate and rhythm, S1 and S2 normal, no murmur, rub   or gallop     Abdomen:     Soft, non-tender, bowel sounds active all four quadrants,    no masses, no organomegaly        Extremities:   Extremities normal, atraumatic, no cyanosis or edema  Pulses:   2+ and symmetric all extremities  Skin:   Skin color, texture, turgor normal, no rashes or lesions  Lymph nodes:   Cervical, supraclavicular, and axillary nodes normal  Neurologic:   CNII-XII intact, normal strength, sensation and reflexes    throughout   LABORATORY DATA:  Results for orders placed or performed in visit on 04/08/15 (from the past 48 hour(s))  CBC with Differential [facility])     Status: None   Collection Time: 04/08/15  1:29 PM  Result Value Ref Range   WBC 8.4 3.9 - 10.0 10e3/uL   RBC 5.12 3.70 - 5.32 10e6/uL   HGB 15.4 11.6 - 15.9 g/dL   HCT 04.5 40.9 - 81.1 %   MCV 86 81 - 101 fL   MCH 30.1 26.0 - 34.0 pg   MCHC 35.1 32.0 - 36.0 g/dL   RDW 91.4 78.2 - 95.6 %   Platelets 314 145 - 400 10e3/uL   NEUT# 4.8 1.5 - 6.5 10e3/uL   LYMPH# 3.0 0.9 - 3.3 10e3/uL   MONO# 0.4 0.1 - 0.9 10e3/uL   Eosinophils Absolute 0.3 0.0 - 0.5 10e3/uL   BASO# 0.0 0.0 - 0.2 10e3/uL   NEUT% 56.6 39.6 - 80.0 %   LYMPH% 35.3 14.0 - 48.0 %   MONO% 4.4 0.0 - 13.0 %   EOS% 3.2 0.0 - 7.0 %   BASO% 0.5 0.0 - 2.0 %  [facility] - Smear     Status: None   Collection Time: 04/08/15  1:29 PM  Result  Value Ref Range   Smear Result Smear Available      RADIOGRAPHY: No results found.     PATHOLOGY: None  ASSESSMENT/PLAN: [name] is a very pleasant 34 yo white female with a history of an enlarged cervical lymph node. I was  able to palpate one "marble" sized posterior cervical node on the right. She noticed this 2 years ago and states that it has gotten bigger. She is also seeing rheumatology for multiple autoimmune symptoms: muscle pain and weakness, Raynaud's and possibly Sjogren's syndrome.  Her lab work today was good. No elevation in WBC or lymphocyte counts. No anemia.  We will get a CT of the neck to better evaluate the cervical nodes. If needed we will schedule her for a biopsy.  We will follow-up with her after receiving her CT scan results.   All questions were answered. The patient knows to call the clinic with any problems, questions or concerns. We can certainly see the patient much sooner if necessary.  The patient was discussed with and also seen by Dr. Myna Hidalgo and he is in agreement with the aforementioned.   [facility]     Addendum:   I saw and examined the patient with Marcelo Ickes. The patient is very very nice. It was fun talking with her.  I just have a hard time believing that she has any malignancy. I really had a tough time palpating any lymph node in her neck.  Are not sure why she has not had a CT scan done before now. She said that it was not approvable. We will do our best to try to get one on her.  I think any lymphadenopathy that she has is reactive.  I looked at her blood on the microscope. I do not see anything that looked suspicious. She good maturation of her white blood cells. There were no atypical lymphocytes. There were no immature myeloid cells. She had no nuclear red cells.  I do feel that there is some type of rheumatologic issue going on here.  Again, we will get a scan on her. We will see what the scan shows. If there is any significant  lymphadenopathy, then I think we will have to get her to a surgeon for a biopsy.  She is very nice. We spent about 30-35 minutes with her. We answered all of her questions.  Hewitt Shorts

## 2015-04-11 ENCOUNTER — Telehealth: Payer: Self-pay | Admitting: Hematology & Oncology

## 2015-04-11 NOTE — Telephone Encounter (Signed)
Contacted pt regarding Ct appt on 7/5 at 8:30

## 2015-04-26 ENCOUNTER — Encounter (HOSPITAL_BASED_OUTPATIENT_CLINIC_OR_DEPARTMENT_OTHER): Payer: Self-pay

## 2015-04-26 ENCOUNTER — Ambulatory Visit (HOSPITAL_BASED_OUTPATIENT_CLINIC_OR_DEPARTMENT_OTHER)
Admission: RE | Admit: 2015-04-26 | Discharge: 2015-04-26 | Disposition: A | Discharge status: Home / Self Care | Payer: Medicaid Other | Source: Ambulatory Visit | Attending: Family | Admitting: Family

## 2015-04-26 DIAGNOSIS — R590 Localized enlarged lymph nodes: Secondary | ICD-10-CM | POA: Diagnosis not present

## 2015-04-26 MED ORDER — IOHEXOL 300 MG/ML  SOLN
75.0000 mL | Freq: Once | INTRAMUSCULAR | Status: AC | PRN
Start: 2015-04-26 — End: 2015-04-26
  Administered 2015-04-26: 75 mL via INTRAVENOUS

## 2015-04-29 ENCOUNTER — Telehealth: Payer: Self-pay | Admitting: Family

## 2015-04-29 NOTE — Telephone Encounter (Signed)
Left a message on [name]'s personal mobile phone with our call back number. I did let her know that there was no need for a follow-up appointment and that her CT was negative. She can call us to go over her CT results in detail if she would like.

## 2015-10-23 HISTORY — PX: ANTERIOR CRUCIATE LIGAMENT REPAIR: SHX115

## 2015-10-25 ENCOUNTER — Emergency Department (HOSPITAL_COMMUNITY)
Admission: EM | Admit: 2015-10-25 | Discharge: 2015-10-25 | Disposition: A | Discharge status: Home / Self Care | Payer: Medicaid Other | Attending: Emergency Medicine | Admitting: Emergency Medicine

## 2015-10-25 ENCOUNTER — Encounter (HOSPITAL_COMMUNITY): Payer: Self-pay | Admitting: *Deleted

## 2015-10-25 DIAGNOSIS — Z79899 Other long term (current) drug therapy: Secondary | ICD-10-CM | POA: Insufficient documentation

## 2015-10-25 DIAGNOSIS — M7989 Other specified soft tissue disorders: Secondary | ICD-10-CM

## 2015-10-25 DIAGNOSIS — Z791 Long term (current) use of non-steroidal anti-inflammatories (NSAID): Secondary | ICD-10-CM | POA: Insufficient documentation

## 2015-10-25 DIAGNOSIS — G43909 Migraine, unspecified, not intractable, without status migrainosus: Secondary | ICD-10-CM | POA: Insufficient documentation

## 2015-10-25 DIAGNOSIS — J45909 Unspecified asthma, uncomplicated: Secondary | ICD-10-CM | POA: Insufficient documentation

## 2015-10-25 DIAGNOSIS — R2241 Localized swelling, mass and lump, right lower limb: Secondary | ICD-10-CM | POA: Insufficient documentation

## 2015-10-25 MED ORDER — ENOXAPARIN SODIUM 80 MG/0.8ML ~~LOC~~ SOLN
1.0000 mg/kg | Freq: Once | SUBCUTANEOUS | Status: AC
  Administered 2015-10-25: 70 mg via SUBCUTANEOUS
  Filled 2015-10-25: qty 0.8

## 2015-10-25 MED ORDER — ENOXAPARIN SODIUM 120 MG/0.8ML ~~LOC~~ SOLN: 104.1000 mg | Freq: Once | SUBCUTANEOUS | Status: DC

## 2015-10-25 NOTE — ED Provider Notes (Signed)
CSN: [id number]     Arrival date & time 10/25/15  1905 History  By signing my name below, I, Avala, attest that this documentation has been prepared under the direction and in the presence of [employer], PA-C. Electronically Signed: Randell Patient, ED Scribe. 10/25/2015. 9:15 PM.    Chief Complaint  Patient presents with  . Fall   The history is provided by the patient. No language interpreter was used.   HPI Comments: Shelby Norton is a 35 y.o. female who presents to the Emergency Department complaining of constant moderate, gradually worsening right lower extremity swelling onset earlier today. Patient reports that she injured her right knee while skiing in [place]. She notes that her skis crossed, causing her to fall backwards, when felt and heard a pop. Per patient, she received a splint en route to the [facility] ED but that it was removed prior to arrival and that swelling began at this point. She had an x-ray of her knee and was diagnosed with a torn ACL and MCL but denies receiving a right ankle or foot x-ray, and told that she was cleared to travel in an airplane. Patient states that swelling and tingling in right leg has gradually worsened since flying in the airplane despite elevation and icing the area. Per patient, she has spoken with her PCP and set-up an appointment to receive an MRI tomorrow but was advised to present to the [facility] ED to rule-out a blood clot. Patient denies pregnancy and taking birth control. She denies ankle pain, calf pain, numbness, CP, SOB, and hematuria.  Past Medical History  Diagnosis Date  . Migraines   . Asthma   . Mitral valve prolapse    Past Surgical History  Procedure Laterality Date  . Nasal septum surgery    . Tonsillectomy     Family History  Problem Relation Age of Onset  . Diabetes Mother   . Hypertension Mother   . Diabetes Father   . Hypertension Father   . Cancer Other   . Stroke Other     Social History  Substance Use Topics  . Smoking status: Never Smoker   . Smokeless tobacco: None  . Alcohol Use: Yes     Comment: occ   OB History    No data available     Review of Systems  Respiratory: Negative for shortness of breath.   Cardiovascular: Negative for chest pain.  Genitourinary: Negative for hematuria.  Musculoskeletal: Positive for joint swelling (right ankle). Negative for myalgias (Right foot, right calf) and arthralgias (Right ankle).  Neurological: Negative for numbness.       Positive for tingling in right ankle and right foot.  All other systems reviewed and are negative.     Allergies  Aspirin; Other; Codeine; and Hydrocodone  Home Medications   Prior to Admission medications   Medication Sig Start Date End Date Taking? Authorizing Provider  acetaminophen (TYLENOL) 500 MG tablet Take 500 mg by mouth every 6 (six) hours as needed for mild pain.   Yes Historical Provider, MD  albuterol (PROAIR HFA) 108 (90 BASE) MCG/ACT inhaler Inhale 90 mcg into the lungs every 6 (six) hours as needed for shortness of breath.  10/02/13  Yes Historical Provider, MD  cetirizine (ZYRTEC) 10 MG tablet Take 10 mg by mouth daily.     Yes Historical Provider, MD  cyclobenzaprine (FLEXERIL) 10 MG tablet Take 10 mg by mouth 3 (three) times daily as needed for muscle spasms.  Yes Historical Provider, MD  naproxen (NAPROSYN) 250 MG tablet Take 250 mg by mouth 2 (two) times daily with a meal.   Yes Historical Provider, MD  ondansetron (ZOFRAN-ODT) 4 MG disintegrating tablet Take 4 mg by mouth every 8 (eight) hours as needed for nausea or vomiting.   Yes Historical Provider, MD  oxycodone (OXY-IR) 5 MG capsule Take 5 mg by mouth every 4 (four) hours as needed for pain.   Yes Historical Provider, MD  traMADol (ULTRAM) 50 MG tablet Take 50 mg by mouth every 6 (six) hours as needed.   Yes Historical Provider, MD  EPINEPHrine (EPIPEN 2-PAK) 0.3 mg/0.3 mL IJ SOAJ injection Inject 0.3  mg into the skin daily as needed. For allergic reaction 06/22/14   Historical Provider, MD   BP 125/69 mmHg  Pulse 86  Temp(Src) 98.1 F (36.7 C) (Oral)  Resp 16  SpO2 98%  LMP 10/25/2015 Physical Exam  Constitutional: She is oriented to person, place, and time. She appears well-developed and well-nourished. No distress.  HENT:  Head: Normocephalic and atraumatic.  Eyes: Conjunctivae are normal. Right eye exhibits no discharge. Left eye exhibits no discharge. No scleral icterus.  Cardiovascular: Normal rate, regular rhythm, normal heart sounds and intact distal pulses.  Exam reveals no gallop and no friction rub.   No murmur heard. Pulmonary/Chest: Effort normal and breath sounds normal. No respiratory distress. She has no wheezes. She has no rales. She exhibits no tenderness.  Abdominal: Soft.  Musculoskeletal:  R knee: significant edema over anterior and lateral aspects or R knee. No varus or valgus laxity No ecchymosis or erythema. Pain with extension of R knee. Positive anterior drawer test.  Edema extends down RLE to ankle and foot,non-pitting. No discoloration. No calf tenderness. Negative Homan's sign. Intact distal pulses. No sensory deficits. No palpable cords or varicosities. No TTP of R ankle.foot. No decrease ROM.   Neurological: She is alert and oriented to person, place, and time. Coordination normal.  Skin: Skin is warm and dry. No rash noted. She is not diaphoretic. No erythema. No pallor.  Psychiatric: She has a normal mood and affect. Her behavior is normal.  Nursing note and vitals reviewed.   ED Course  Procedures   DIAGNOSTIC STUDIES: Oxygen Saturation is 98% on RA, normal by my interpretation.    COORDINATION OF CARE: 8:03 PM Will treat prophylactically for blood clot. Will order [place] and advised pt to return ED first thing in the morning for [place]. Advised pt to follow-up with PCP. Discussed treatment plan with pt at bedside and pt agreed to plan.  9:14 PM  Returned to speak with pt upon pt's request.   Labs Review Labs Reviewed - No data to display  Imaging Review No results found. I have personally reviewed and evaluated these images and lab results as part of my medical decision-making.   EKG Interpretation None      MDM   Final diagnoses:  Swelling of lower extremity   Pt with recent traumatic knee injury yesterday with probable ligamentous injury. Patient is scheduled to see orthopedist for consultation and MRI. Now with increased swelling of right lower extremity and recent airplane travel. Concern for possible DVT. Patient is not hypoxic or tachycardic. No chest pain or shortness of breath. Doubt PE. Unfortunately, venous duplex ultrasound facilities or not available at this time. We will give prophylactic dose of Lovenox, per pharmacy protocol. Patient will return to [facility] tomorrow morning to have ultrasound performed to rule out  blood clot. No additional imaging needed at this time as patient had multiple x-rays performed at emergency department at time of knee injury. Patient is currently nonweightbearing on that extremity and has both a knee brace and crutches already. Patient has pain medication that she may take at home for pain. Discussed plan with patient who is agreeable. Return precautions outlined in patient discharge instructions.   I personally performed the services described in this documentation, which was scribed in my presence. The recorded information has been reviewed and is accurate.    Lester KinsmanSamantha Tripp FisherDowless, PA-C 10/29/15 1303  Mancel BaleElliott Wentz, MD 10/29/15 [phone]

## 2015-10-25 NOTE — Discharge Instructions (Signed)
Edema °Edema is an abnormal buildup of fluids in your body tissues. Edema is somewhat dependent on gravity to pull the fluid to the lowest place in your body. That makes the condition more common in the legs and thighs (lower extremities). Painless swelling of the feet and ankles is common and becomes more likely as you get older. It is also common in looser tissues, like around your eyes.  °When the affected area is squeezed, the fluid may move out of that spot and leave a dent for a few moments. This dent is called pitting.  °CAUSES  °There are many possible causes of edema. Eating too much salt and being on your feet or sitting for a long time can cause edema in your legs and ankles. Hot weather may make edema worse. Common medical causes of edema include: °· Heart failure. °· Liver disease. °· Kidney disease. °· Weak blood vessels in your legs. °· Cancer. °· An injury. °· Pregnancy. °· Some medications. °· Obesity.  °SYMPTOMS  °Edema is usually painless. Your skin may look swollen or shiny.  °DIAGNOSIS  °Your health care provider may be able to diagnose edema by asking about your medical history and doing a physical exam. You may need to have tests such as X-rays, an electrocardiogram, or blood tests to check for medical conditions that may cause edema.  °TREATMENT  °Edema treatment depends on the cause. If you have heart, liver, or kidney disease, you need the treatment appropriate for these conditions. General treatment may include: °· Elevation of the affected body part above the level of your heart. °· Compression of the affected body part. Pressure from elastic bandages or support stockings squeezes the tissues and forces fluid back into the blood vessels. This keeps fluid from entering the tissues. °· Restriction of fluid and salt intake. °· Use of a water pill (diuretic). These medications are appropriate only for some types of edema. They pull fluid out of your body and make you urinate more often. This  gets rid of fluid and reduces swelling, but diuretics can have side effects. Only use diuretics as directed by your health care provider. °HOME CARE INSTRUCTIONS  °· Keep the affected body part above the level of your heart when you are lying down.   °· Do not sit still or stand for prolonged periods.   °· Do not put anything directly under your knees when lying down. °· Do not wear constricting clothing or garters on your upper legs.   °· Exercise your legs to work the fluid back into your blood vessels. This may help the swelling go down.   °· Wear elastic bandages or support stockings to reduce ankle swelling as directed by your health care provider.   °· Eat a low-salt diet to reduce fluid if your health care provider recommends it.   °· Only take medicines as directed by your health care provider.  °SEEK MEDICAL CARE IF:  °· Your edema is not responding to treatment. °· You have heart, liver, or kidney disease and notice symptoms of edema. °· You have edema in your legs that does not improve after elevating them.   °· You have sudden and unexplained weight gain. °SEEK IMMEDIATE MEDICAL CARE IF:  °· You develop shortness of breath or chest pain.   °· You cannot breathe when you lie down. °· You develop pain, redness, or warmth in the swollen areas.   °· You have heart, liver, or kidney disease and suddenly get edema. °· You have a fever and your symptoms suddenly get worse. °MAKE SURE YOU:  °·   Understand these instructions.  Will watch your condition.  Will get help right away if you are not doing well or get worse.   This information is not intended to replace advice given to you by your health care provider. Make sure you discuss any questions you have with your health care provider.  [facility] ultrasound of right lower extremity to rule out DVT. Keep extremity elevated. Apply ice to affected area. Keep appointment with primary care provider tomorrow. Do not bear weight  on your right lower extremity. Return to the emergency department if you experience chest pain, shortness of breath, increased swelling or redness in your lower extremity, severe increasing ear pain, numbness or tingling.

## 2015-10-25 NOTE — ED Notes (Signed)
Pt. Is back from a ski trip and was seen at an emergency in [place] for a ski accident. Pt has an appointment with her PCP in the morning. Pt flew on a plane and now has swelling in her right lower extremity.

## 2015-10-25 NOTE — ED Notes (Signed)
Pt. Left with all belongings and refused wheelchair. Discharge instructions were reviewed and all questions were answered.  

## 2015-10-26 ENCOUNTER — Ambulatory Visit (HOSPITAL_COMMUNITY)
Admission: RE | Admit: 2015-10-26 | Discharge: 2015-10-26 | Disposition: A | Discharge status: Home / Self Care | Payer: Managed Care, Other (non HMO) | Source: Ambulatory Visit | Attending: Emergency Medicine | Admitting: Emergency Medicine

## 2015-10-26 DIAGNOSIS — M79604 Pain in right leg: Secondary | ICD-10-CM | POA: Insufficient documentation

## 2015-10-26 DIAGNOSIS — M79609 Pain in unspecified limb: Secondary | ICD-10-CM

## 2015-10-26 NOTE — Progress Notes (Signed)
*  Preliminary Results* Right lower extremity venous duplex completed. Right lower extremity is negative for deep vein thrombosis. There is no evidence of right Baker's cyst.  10/26/2015 9:11 AM  Gertie FeyMichelle Pieper Kasik, RVT, RDCS, RDMS

## 2015-12-28 ENCOUNTER — Ambulatory Visit: Payer: Managed Care, Other (non HMO) | Attending: Orthopedic Surgery | Admitting: Physical Therapy

## 2015-12-28 DIAGNOSIS — R29898 Other symptoms and signs involving the musculoskeletal system: Secondary | ICD-10-CM | POA: Insufficient documentation

## 2015-12-28 DIAGNOSIS — M25561 Pain in right knee: Secondary | ICD-10-CM | POA: Insufficient documentation

## 2015-12-28 DIAGNOSIS — R269 Unspecified abnormalities of gait and mobility: Secondary | ICD-10-CM | POA: Diagnosis present

## 2015-12-28 DIAGNOSIS — M25661 Stiffness of right knee, not elsewhere classified: Secondary | ICD-10-CM

## 2015-12-28 NOTE — Therapy (Signed)
[facility] [address]  [address] [place], [place], [address] Phone: [phone]   Fax:  [phone]  Physical Therapy Evaluation  Patient Details  Name: Shelby Norton MRN: [id number] Date of Birth: 04/03/1981 Referring Provider: Dannielle Huh, MD  Encounter Date: 12/28/2015      PT End of Session - 12/28/15 0953    Visit Number 1   Number of Visits 6   Date for PT Re-Evaluation 01/11/16   PT Start Time 0855  Pt arrived late   PT Stop Time 0944   PT Time Calculation (min) 49 min   Activity Tolerance Patient tolerated treatment well;Patient limited by pain   Behavior During Therapy [facility] for tasks assessed/performed      Past Medical History  Diagnosis Date  . Migraines   . Asthma   . Mitral valve prolapse     Past Surgical History  Procedure Laterality Date  . Nasal septum surgery    . Tonsillectomy      There were no vitals filed for this visit.  Visit Diagnosis:  Right knee pain  Decreased ROM of right knee  Decreased strength involving knee joint  Abnormality of gait      Subjective Assessment - 12/28/15 0901    Subjective Pt reports she injured her R knee while skiing in [place] when she caught her ski on a sticky patch of snow. Initial x-rays negative for fracture. Doppler US negative for DVT. MRI revealed ACL tear and partial tear of politeus along with some tearing of cartilidge in knee. Underwent surgery with achilles allograft on 11/16/15. Started PT in [place] ~1 week post-op. Currently states lacking TKE and dealing with issues with hamstring and gastroc tightness. Has wedding come up on 01/07/16 and would like [place]. Will be leaving on honeymoon on 01/10/16 and will be moving to [place] upon return from honeymoon.   Pertinent History R ACL reconstruction with achilles allograft on 11/16/15   Patient Stated Goals "To be able to wear heels to walk [place]  the [place] at my [place] on 01/07/16."   Currently in Pain? Yes   Pain Score 2   Least 0/10, Avg 2/10, Worst 6-7/10 (typically after PT or when workingon extension)   Pain Location Knee   Pain Orientation Right;Posterior   Pain Descriptors / Indicators Constant;Dull;Aching   Pain Type Surgical pain   Pain Onset More than a month ago   Pain Frequency Intermittent   Aggravating Factors  Working on extension ROM, PT session   Pain Relieving Factors Ice, OTC pain meds   Effect of Pain on Daily Activities Climbing stairs limited            [facility] PT Assessment - 12/28/15 0855    Assessment   Medical Diagnosis R ACL reconstruction   Referring Provider Dannielle Huh, MD   Onset Date/Surgical Date 11/16/15   Next MD Visit 01/04/16   Prior Therapy OP PT starting 11/24/15 in [place]   Balance Screen   Has the patient fallen in the past 6 months Yes   How many times? 1  while skiing   Has the patient had a decrease in activity level because of a fear of falling?  Yes   Is the patient reluctant to leave their home because of a fear of falling?  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   Leisure Skiing, training for physical fitness competition, planning to run marathon for 1st anniversary   Observation/Other Assessments   Focus on Therapeutic Outcomes (FOTO)  Knee 42% (58% limitation)   ROM / Strength   AROM / PROM / Strength AROM   AROM   AROM Assessment Site Knee   Right/Left Knee Right;Left   Right Knee Extension 1   Right Knee Flexion 125   Left Knee Extension -7   Left Knee Flexion 146   Flexibility   Soft Tissue Assessment /Muscle Length yes   Hamstrings moderate tightness on right   Palpation   Patella mobility WFL medial/lateral; slighty restricted sup/inf   Palpation comment extremely ttp over distal medial hamstring and popliteus tendon   Ambulation/Gait   Gait Pattern  Antalgic;Decreased hip/knee flexion - right;Decreased weight shift to right               PT Education - 12/28/15 1016    Education provided Yes   Education Details PT eval findings and POC   Person(s) Educated Patient   Methods Explanation   Comprehension Verbalized understanding             PT Long Term Goals - 12/28/15 1011    PT LONG TERM GOAL #1   Title Pt will be independent with current HEP to continue during honeymoon and move to [place] by 01/06/16   Status New   PT LONG TERM GOAL #2   Title Pt will ambulate with brace unlocked with normal gait pattern on level surfaces by 01/06/16   Status New               Plan - 12/28/15 0954    Clinical Impression Statement Patient is a 35 y/o female who presents to OP PT ~6 weeks s/p R ACL reconstruction with achilles tendon allograft on 11/16/15. Pt initially injured R knee while skiing in [place] earlier in January, with patient reporting injuries including R ACL rupture, popliteral tear and cartilage tears in R knee. Pt started OP PT in [place] ~1 week post-op and now transitioning here briefly for 2 weeks prior to upcoming wedding on 01/07/16, after which she will be moving to [place] and continuing PT there. R knee current ROM 1-125 as compared to -7-146 on L, with extension ROM limited by swelling, R medial hamstring pain and tightness, popliteal pain and decreased quad control. Gait pattern demonstrates limited R hip and knee flexion (limited by brace locked at 45 dg flexion) with slightly decreased weight shift to right. Brace unlocked with pt demonstrating some normalization of gait pattern but still favoring right LE. Pt demonstrates significant ttp over R distal medial hamstring and popliteal tendon with increased porterior knee and inferior patellar tendon swelling.   Pt will benefit from skilled therapeutic intervention in order to improve on the following deficits Pain;Impaired  flexibility;Decreased range of motion;Decreased strength;Difficulty walking;Abnormal gait;Decreased balance;Increased edema;Decreased scar mobility   Rehab Potential Good   PT Frequency 3x / week   PT Duration 2 weeks   PT Treatment/Interventions Patient/family education;Manual techniques;Scar mobilization;Passive range of motion;Taping;Therapeutic exercise;Therapeutic activities;Functional mobility training;Gait training;Electrical Stimulation;Cryotherapy;Vasopneumatic Device;Ultrasound   PT Next Visit Plan Manual therapy for hamstring tightness and pain with possible kinesiotaping, Exercises per ACL protocol, Modalities as needed for edema and pain control   Consulted and Agree with Plan of Care Patient         Problem List Patient Active Problem List   Diagnosis  Date Noted  . Rhabdomyolysis 01/08/2014    Marry Guan, PT, MPT 12/28/2015, 10:23 AM  [facility] [address]  [address] [place], [place], [address] Phone: [phone]   Fax:  [phone]  Name: Shelby Norton MRN: [id number] Date of Birth: 1981/04/07

## 2015-12-29 ENCOUNTER — Ambulatory Visit: Payer: Managed Care, Other (non HMO)

## 2015-12-29 DIAGNOSIS — M25661 Stiffness of right knee, not elsewhere classified: Secondary | ICD-10-CM

## 2015-12-29 DIAGNOSIS — R269 Unspecified abnormalities of gait and mobility: Secondary | ICD-10-CM

## 2015-12-29 DIAGNOSIS — M25561 Pain in right knee: Secondary | ICD-10-CM

## 2015-12-29 DIAGNOSIS — R29898 Other symptoms and signs involving the musculoskeletal system: Secondary | ICD-10-CM

## 2015-12-29 NOTE — Therapy (Signed)
[facility] [address]  [address] [place], [place], [address] Phone: [phone]   Fax:  [phone]  Physical Therapy Treatment  Patient Details  Name: Shelby Norton MRN: [id number] Date of Birth: 09/14/1981 Referring Provider: Dannielle HuhSteve Lucey, MD  Encounter Date: 12/29/2015      PT End of Session - 12/29/15 0859    Visit Number 2   Number of Visits 6   Date for PT Re-Evaluation 01/11/16   PT Start Time 0853  treatment started late due to pt. arriving to PT late.     PT Stop Time 0935   PT Time Calculation (min) 42 min   Activity Tolerance Patient tolerated treatment well   Behavior During Therapy WFL for tasks assessed/performed      Past Medical History  Diagnosis Date  . Migraines   . Asthma   . Mitral valve prolapse     Past Surgical History  Procedure Laterality Date  . Nasal septum surgery    . Tonsillectomy      There were no vitals filed for this visit.  Visit Diagnosis:  Right knee pain  Decreased ROM of right knee  Decreased strength involving knee joint  Abnormality of gait      Subjective Assessment - 12/29/15 0855    Subjective 2/10 R knee pain currently.  Pt. reports pulling discomfort above L patella in lower quad muscle.  No pain reported at L knee.     Currently in Pain? Yes   Pain Score 2    Pain Location Knee   Pain Orientation Right;Posterior   Pain Descriptors / Indicators Aching;Dull;Constant   Pain Type Surgical pain   Pain Onset More than a month ago   Pain Frequency Intermittent   Aggravating Factors  extending the knee.   Pain Relieving Factors ice, pain meds   Multiple Pain Sites No            [facility] PT Assessment - 12/28/15 0855    Assessment   Medical Diagnosis R ACL reconstruction   Referring Provider Dannielle HuhSteve Lucey, MD   Onset Date/Surgical Date 11/16/15   Next MD Visit 01/04/16   Prior Therapy [place] PT [place]   Balance Screen   Has the  patient fallen in the past 6 months Yes   How many times? 1  while skiing   Has the patient had a decrease in activity level because of a fear of falling?  Yes   Is the patient reluctant to leave their home because of a fear of falling?  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   Leisure Skiing, training for physical fitness competition, planning to run marathon for 1st anniversary   Observation/Other Assessments   Focus on Therapeutic Outcomes (FOTO)  Knee 42% (58% limitation)   ROM / Strength   AROM / PROM / Strength AROM   AROM   AROM Assessment Site Knee   Right/Left Knee Right;Left   Right Knee Extension 1   Right Knee Flexion 125   Left Knee Extension -7   Left Knee Flexion 146   Flexibility   Soft Tissue Assessment /Muscle Length yes   Hamstrings moderate tightness on right   Palpation   Patella mobility WFL medial/lateral; slighty restricted sup/inf   Palpation comment extremely ttp over distal medial hamstring and popliteus tendon  Ambulation/Gait   Gait Pattern Antalgic;Decreased hip/knee flexion - right;Decreased weight shift to right       Today's treatment: Therex: R HS stretch x 30 sec  R hip flexor stretch in mod thomas x 30 sec  HS curl x 10 reps with heels on peanut ball p-ball  Quad sets 5 sec hold x 10 reps  Bridging x 10 reps Bridging x 10 reps with hip adduction squeeze with green ball between knees Single leg Bridging x 10 each side.   Hook-lying clam shell with green TB around knees x 10 reps        PT Education - 12/28/15 1016    Education provided Yes   Education Details PT eval findings and POC   Person(s) Educated Patient   Methods Explanation   Comprehension Verbalized understanding          PT Long Term Goals - 12/29/15 1201    PT LONG TERM GOAL #1   Title Pt will be independent with current HEP to continue during honeymoon and  move to [place] by 01/06/16   Status On-going   PT LONG TERM GOAL #2   Title Pt will ambulate with brace unlocked with normal gait pattern on level surfaces by 01/06/16   Status On-going           Plan - 12/29/15 0900    Clinical Impression Statement Pt. continues to have point tenderness over insertion of popliteal at R knee.  Estimated R knee ROM continues to be 1-125 dg + with extension ROM pain 2/10 posterior knee.  Pt. tolerated all hip  knee strengthening today well with focus on R quad strengthening.     Pt will benefit from skilled therapeutic intervention in order to improve on the following deficits --   Rehab Potential --   PT Frequency --   PT Duration --   PT Treatment/Interventions --   PT Next Visit Plan Manual therapy for hamstring tightness and pain with possible kinesiotaping, Exercises per ACL protocol, Modalities as needed for edema and pain control   Consulted and Agree with Plan of Care --        Problem List Patient Active Problem List   Diagnosis Date Noted  . Rhabdomyolysis 01/08/2014    Kermit Balo, PTA 12/29/2015, 12:22 PM  [facility] [address]  [address] [place], [place], [address] Phone: [phone]   Fax:  [phone]  Name: Shelby Norton MRN: [id number] Date of Birth: [birth year]

## 2015-12-30 ENCOUNTER — Ambulatory Visit: Payer: Managed Care, Other (non HMO)

## 2015-12-30 DIAGNOSIS — R269 Unspecified abnormalities of gait and mobility: Secondary | ICD-10-CM

## 2015-12-30 DIAGNOSIS — M25661 Stiffness of right knee, not elsewhere classified: Secondary | ICD-10-CM

## 2015-12-30 DIAGNOSIS — M25561 Pain in right knee: Secondary | ICD-10-CM

## 2015-12-30 DIAGNOSIS — R29898 Other symptoms and signs involving the musculoskeletal system: Secondary | ICD-10-CM

## 2015-12-30 NOTE — Therapy (Signed)
[facility] [address]  [address] [place], [place], [address] Phone: [phone]   Fax:  [phone]  Physical Therapy Treatment  Patient Details  Name: Shelby Norton MRN: [id number] Date of Birth: 12/13/1980 Referring Provider: Dannielle HuhSteve Lucey, MD  Encounter Date: 12/30/2015      PT End of Session - 12/30/15 0807    Visit Number 3   Number of Visits 6   Date for PT Re-Evaluation 01/11/16   PT Start Time 0804   PT Stop Time 0849   PT Time Calculation (min) 45 min   Activity Tolerance Patient tolerated treatment well   Behavior During Therapy [facility] for tasks assessed/performed      Past Medical History  Diagnosis Date  . Migraines   . Asthma   . Mitral valve prolapse     Past Surgical History  Procedure Laterality Date  . Nasal septum surgery    . Tonsillectomy      There were no vitals filed for this visit.  Visit Diagnosis:  Right knee pain  Decreased ROM of right knee  Decreased strength involving knee joint  Abnormality of gait      Subjective Assessment - 12/30/15 0803    Subjective 2/10 R knee pain currently.  Pt. reports the same pulling discomfort above the L patella in lower quad area.  No pain reported at L knee.     Patient Stated Goals "To be able to wear heels to [place] at my [place] on 01/07/16."   Currently in Pain? Yes   Pain Score 2    Pain Location Knee   Pain Orientation Right;Posterior   Pain Descriptors / Indicators Aching;Dull;Constant   Pain Type Surgical pain   Pain Onset More than a month ago   Pain Frequency Intermittent   Aggravating Factors  extending the knee   Pain Relieving Factors ice pain meds   Multiple Pain Sites No       Today's treatment: Therex:  NuStep: level 3, 4 min  R RF, HS, SKTC x 30 sec   bridging with isometric / ER 3 x 5 reps SLR 4 directions with #2 x 10 reps each way   Prone R glute raises with 2# cuff weight around ankle x 10  reps Hook-lying adduction squeeze x 10 reps with green ball  B HS curl x 10 ramp L side lying R hip abduction SLR x 15 reps no weight  Hook lying clam shells with Blue TB x 15 reps  Leg press 10# R x 15 reps; focus on 5 sec eccentric lowering (No terminal knee extension; exercise performed in 40-120 dg range) Seated Fitter R quad extension; focus on 5 sec eccentric lowering (No terminal knee extension; exercise performed in 40-120 dg range)        PT Long Term Goals - 12/29/15 1201    PT LONG TERM GOAL #1   Title Pt will be independent with current HEP to continue during honeymoon and move to [place] by 01/06/16   Status On-going   PT LONG TERM GOAL #2   Title Pt will ambulate with brace unlocked with normal gait pattern on level surfaces by 01/06/16   Status On-going          Plan - 12/30/15 0807    Clinical Impression Statement Pt. tolerated addition of leg press and Seated fitter eccentric quad strengthening well today; with focus on 5 sec eccentric lowering (No terminal knee extension; exercise  performed in 40-120 dg range).  Today's treatment activities progress per ACL protocol and pt. tolerance.  Pt. R knee movement continues to be guarded requiring increased time with stretches for benefit.     PT Next Visit Plan Assess goals.  Manual therapy for hamstring tightness and pain with possible kinesiotaping, Exercises per ACL protocol, Modalities as needed for edema and pain control      Problem List Patient Active Problem List   Diagnosis Date Noted  . Rhabdomyolysis 01/08/2014    Kermit Balo, PTA 12/30/2015, 12:24 PM  [facility] [address]  [address] [place], [place], [address] Phone: [phone]   Fax:  [phone]  Name: Shelby Norton MRN: [id number] Date of Birth: 02-Aug-1981

## 2016-01-03 ENCOUNTER — Ambulatory Visit: Payer: Managed Care, Other (non HMO) | Admitting: Physical Therapy

## 2016-01-04 ENCOUNTER — Ambulatory Visit: Payer: Managed Care, Other (non HMO)

## 2016-01-04 DIAGNOSIS — M25661 Stiffness of right knee, not elsewhere classified: Secondary | ICD-10-CM

## 2016-01-04 DIAGNOSIS — Z9889 Other specified postprocedural states: Secondary | ICD-10-CM | POA: Insufficient documentation

## 2016-01-04 DIAGNOSIS — R269 Unspecified abnormalities of gait and mobility: Secondary | ICD-10-CM

## 2016-01-04 DIAGNOSIS — M25561 Pain in right knee: Secondary | ICD-10-CM | POA: Diagnosis not present

## 2016-01-04 DIAGNOSIS — R29898 Other symptoms and signs involving the musculoskeletal system: Secondary | ICD-10-CM

## 2016-01-04 NOTE — Therapy (Addendum)
[facility] [address]  [address] [place], [place], [address] Phone: [phone]   Fax:  [phone]  Physical Therapy Treatment  Patient Details  Name: Shelby Norton MRN: [id number] Date of Birth: May 05, 1981 Referring Provider: Dannielle Huh, MD  Encounter Date: 01/04/2016      PT End of Session - 01/04/16 0912    Visit Number 4   Number of Visits 6   Date for PT Re-Evaluation 01/11/16   PT Start Time 0815  PT session started late due to pt. arriving late to therapy.     PT Stop Time 0920   PT Time Calculation (min) 65 min   Activity Tolerance Patient tolerated treatment well   Behavior During Therapy [facility] for tasks assessed/performed      Past Medical History  Diagnosis Date  . Migraines   . Asthma   . Mitral valve prolapse     Past Surgical History  Procedure Laterality Date  . Nasal septum surgery    . Tonsillectomy      There were no vitals filed for this visit.  Visit Diagnosis:  Right knee pain  Decreased ROM of right knee  Decreased strength involving knee joint  Abnormality of gait      Subjective Assessment - 01/04/16 0846    Subjective Pt. reports R knee pain 2/10 currently feeling worse than usual.  Pt. reports feeling sharp "electric" pain at R anterior knee while practicing dips with dancing for wedding over weekend.  Pt. currenly reports amb. with brace unlocked however reports limping over weekend.     Patient Stated Goals "To be able to wear heels to [place] at my [place] on 01/07/16."   Currently in Pain? Yes   Pain Score 2    Pain Location Knee   Pain Orientation Right;Posterior;Anterior   Pain Descriptors / Indicators Aching;Dull;Constant   Pain Type Surgical pain   Pain Onset More than a month ago   Pain Frequency Intermittent   Aggravating Factors  extending the knee    Pain Relieving Factors ice, pain meds    Effect of Pain on Daily Activities climbing stairs    Multiple Pain Sites No      Today's treatment: Therex:  Recumbent bike level 3, 4 min   Prone RF / sartorius stretch in prone with strap x 1 min; ER R hip 30 sec; IR R hip 30 sec  L side lying R vastus lateralis foam roller STM (for pain relief and tightness) x 3 min  R RF, HS, x 30 sec   bridging with isometric / ER with black TB 3 x 5 reps Supine straight leg hip adduction x 5 sec hold x 10 reps hooklying bridging with hip adduction ball squeeze x 10 reps Leg press 10# R x 15 reps; focus on 5 sec eccentric lowering (No terminal knee extension; exercise performed in 40-120 dg range)  HEP review: - hooklying bridging with isometric / ER with blue TB 3 x 5 reps  - prone hip flexor stretch x 30 sec with bolster under knee - bridging x 10 reps       PT Education - 01/04/16 1134    Education provided Yes   Education Details HEP: Prone RF stretch with strap, bridge with isometric / ER with band, bridge    Person(s) Educated Patient   Methods Explanation;Demonstration;Verbal cues;Handout   Comprehension Verbalized understanding;Returned demonstration;Verbal cues required  PT Long Term Goals - 01/04/16 [id number]    PT LONG TERM GOAL #1   Title Pt will be independent with current HEP to continue during honeymoon and move to [place] by 01/06/16  01/04/16: Pt. verbalized / demo'd Independence with updated HEP.     Status Achieved   PT LONG TERM GOAL #2   Title Pt will ambulate with brace unlocked with normal gait pattern on level surfaces by 01/06/16   Status On-going               Plan - 01/04/16 0913    Clinical Impression Statement Pt. with increased initial R knee pain today; pt. reports practicing wedding dance dip over weekend and feeling sharp electric pain R anterior knee.  Pt. tolerated leg press eccentric quad activity well today (40 - 90 dg range) however unable to tolerated mini squat (40-90 dg range) next to wall.  Hip / knee strengthening / stretching  progressed in today's treatment per pt. tolerance.  HEP updated today; pt. verbalized / demo'd understanding of all HEP activities.     PT Next Visit Plan Assess STG #2 (gait).  Manual therapy for hamstring tightness and pain with possible kinesiotaping, Exercises per ACL protocol, Modalities as needed for edema and pain control        Problem List Patient Active Problem List   Diagnosis Date Noted  . Rhabdomyolysis 01/08/2014    Kermit BaloMicah Jared Whorley, PTA 01/04/2016, 11:36 AM  [facility] [address]  [address] [place], [place], [address] Phone: [phone]   Fax:  [phone]  Name: Shelby Norton MRN: [id number] Date of Birth: 02/04/1981

## 2016-01-05 ENCOUNTER — Ambulatory Visit: Payer: Managed Care, Other (non HMO)

## 2016-01-05 DIAGNOSIS — M25661 Stiffness of right knee, not elsewhere classified: Secondary | ICD-10-CM

## 2016-01-05 DIAGNOSIS — M25561 Pain in right knee: Secondary | ICD-10-CM

## 2016-01-05 DIAGNOSIS — R29898 Other symptoms and signs involving the musculoskeletal system: Secondary | ICD-10-CM

## 2016-01-05 DIAGNOSIS — R269 Unspecified abnormalities of gait and mobility: Secondary | ICD-10-CM

## 2016-01-05 NOTE — Therapy (Signed)
[facility] [address]  [address] [place], [place], [address] Phone: [phone]   Fax:  [phone]  Physical Therapy Treatment  Patient Details  Name: Shelby Norton MRN: [id number] Date of Birth: 03/04/1981 Referring Provider: Dannielle HuhSteve Lucey, MD  Encounter Date: 01/05/2016      PT End of Session - 01/05/16 1453    Visit Number 5   Number of Visits 6   Date for PT Re-Evaluation 01/11/16   PT Start Time 1449   PT Stop Time 1530   PT Time Calculation (min) 41 min   Activity Tolerance Patient tolerated treatment well   Behavior During Therapy [facility] for [facility] assessed/performed      Past Medical History  Diagnosis Date  . Migraines   . Asthma   . Mitral valve prolapse     Past Surgical History  Procedure Laterality Date  . Nasal septum surgery    . Tonsillectomy      There were no vitals filed for this visit.  Visit Diagnosis:  Right knee pain  Decreased ROM of right knee  Decreased strength involving knee joint  Abnormality of gait      Subjective Assessment - 01/05/16 1450    Subjective Pt. reports R knee pain 0/10 currently.  No report of electric R knee pain today even following dancing dips practrice for wedding.     Patient Stated Goals "To be able to wear heels to walk down the [place]le at my [place] on 01/07/16."   Currently in Pain? No/denies   Pain Score 0-No pain   Multiple Pain Sites No      Today's treatment: Therex:  Recumbent bike level 3, 4 min  Prone RF / sartorius stretch in prone with strap x 1 min; ER R hip 30 sec; IR R hip 30 sec  R RF, HS, x 30 sec  bridging with isometric / ER with black TB 3 x 5 reps SL Bridging x 10 reps each side  Supine straight leg hip adduction with 2# x 10 reps R Hip extension SLR with #2 x 10 reps  R Hip extension prone with knee bent x 10 resp  hooklying bridging with hip adduction ball squeeze x 10 reps Leg press 10# R x 15 reps; focus on 5 sec  eccentric lowering (No terminal knee extension; exercise performed in 40-120 dg range)   HEP review: - Hooklying bridging with isometric / ER with blue TB 3 x 5 reps  - hooklying clam shell with TB   - Prone hip flexor stretch x 30 sec with bolster under knee - bridging x 10 reps                            PT Education - 01/04/16 1134    Education provided Yes   Education Details HEP: Prone RF stretch with strap, bridge with isometric / ER with band, bridge    Person(s) Educated Patient   Methods Explanation;Demonstration;Verbal cues;Handout   Comprehension Verbalized understanding;Returned demonstration;Verbal cues required             PT Long Term Goals - 01/05/16 1453    PT LONG TERM GOAL #1   Title Pt will be independent with current HEP to continue during honeymoon and move to [place] by 01/06/16  01/04/16: Pt. verbalized / demo'd Independence with updated HEP.     Status Achieved   PT LONG TERM GOAL #2  Title Pt will ambulate with brace unlocked with normal gait pattern on level surfaces by 01/06/16   Status On-going               Plan - 01/05/16 1453    Clinical Impression Statement Pt. with 0/10 pain today tolerated all hip / knee stregthening activity well with no pain increase.  Pt. tolerated increased reps with R eccentric quad strengthening activity well within 40-120 dg AROM.  Advanced HEP reviewed; pt. verbalized and demo'd independence with all activities recalling easily.  Gait pattern not assessed today; however pt. ambulated into therapy with even gait pattern and no brace.     PT Next Visit Plan Assess STG #2 (gait).  Manual therapy for hamstring tightness and pain with possible kinesiotaping, Exercises per ACL protocol, Modalities as needed for edema and pain control        Problem List Patient Active Problem List   Diagnosis Date Noted  . Rhabdomyolysis 01/08/2014    Kermit Balo, PTA 01/05/2016, 5:56 PM  [facility] [address]  [address] [place], [place], [address] Phone: [phone]   Fax:  [phone]  Name: Shelby Norton MRN: [id number] Date of Birth: 02-17-1981

## 2016-01-06 ENCOUNTER — Ambulatory Visit: Payer: Managed Care, Other (non HMO) | Admitting: Physical Therapy

## 2016-01-06 DIAGNOSIS — M25561 Pain in right knee: Secondary | ICD-10-CM | POA: Diagnosis not present

## 2016-01-06 DIAGNOSIS — M25661 Stiffness of right knee, not elsewhere classified: Secondary | ICD-10-CM

## 2016-01-06 DIAGNOSIS — R29898 Other symptoms and signs involving the musculoskeletal system: Secondary | ICD-10-CM

## 2016-01-06 DIAGNOSIS — R269 Unspecified abnormalities of gait and mobility: Secondary | ICD-10-CM

## 2016-01-06 NOTE — Therapy (Signed)
[facility] [address]  [place], [place], [address] Phone: [phone]   Fax:  [phone]  Physical Therapy Treatment  Patient Details  Name: WYNNE ROZAK MRN: [id number] Date of Birth: [birth year] Referring Provider: Vickey Huger, MD  Encounter Date: 01/06/2016      PT End of Session - 01/06/16 0813    Visit Number 6   Number of Visits 6   PT Start Time 0811  Pt arrived late   PT Stop Time 0842   PT Time Calculation (min) 31 min   Activity Tolerance Patient tolerated treatment well   Behavior During Therapy [facility] for tasks assessed/performed      Past Medical History  Diagnosis Date  . Migraines   . Asthma   . Mitral valve prolapse     Past Surgical History  Procedure Laterality Date  . Nasal septum surgery    . Tonsillectomy      There were no vitals filed for this visit.  Visit Diagnosis:  Right knee pain  Decreased ROM of right knee  Decreased strength involving knee joint  Abnormality of gait      Subjective Assessment - 01/06/16 0813    Subjective Pt denies pain today, just tight in back of knee.   Pertinent History R ACL reconstruction with achilles allograft on 11/16/15   Patient Stated Goals "To be able to wear heels to walk down the [place] at my wedding on 01/07/16."   Currently in Pain? No/denies            [facility] PT Assessment - 01/06/16 0811    Assessment   Medical Diagnosis R ACL reconstruction   Onset Date/Surgical Date 11/16/15   AROM   AROM Assessment Site Knee   Right/Left Knee Right   Right Knee Extension -1   Right Knee Flexion 140   Ambulation/Gait   Gait Pattern Within Functional Limits   Stairs Yes   Stairs Assistance 6: Modified independent (Device/Increase time)   Stair Management Technique No rails;Alternating pattern   Number of Stairs 13   Height of Stairs 7   Gait Comments Decreased R eccentric quad control with reciprocal stair descent       Today's Treatment  ROM check Gait assessment Goal Assessment  TherEx Rec bike level 3 x 4'  Manual R RF stretch in L sidelying, R HS stretch in supine 2x30"  bridging with isometric / ER with black TB 3 x 5 reps SL Bridging x 10 reps each side  Bridge + Alternating Hip ABD/ER with black TB 2x10 L sidelying R Hip ABD/ER clam with blue TB 2x10 R Hip extension prone with knee bent 2x10 Minisquat x10 Leg press 10# R x 10, 15# x10; focus on 5 sec eccentric lowering (No terminal knee extension; exercise performed in 40-120 dg range)           PT Long Term Goals - 01/06/16 0825    PT LONG TERM GOAL #1   Title Pt will be independent with current HEP to continue during honeymoon and move to [place] by 01/06/16   Status Achieved   PT LONG TERM GOAL #2   Title Pt will ambulate with brace unlocked with normal gait pattern on level surfaces by 01/06/16   Status Achieved               Plan - 01/06/16 1314    Clinical Impression Statement Pt has been seen for the past 2 weeks following  initial OP PT in [place] while preparing for her wedding tomorrow and subsequent move to [place] after her honeymoon. In this time, she has regained nearly full ROM of R knee (-1 to 140) as compared to L knee (-7 to 146) and has weaned out of her knee brace with normal gait pattern achieved on level surfaces. She is independent with a HEP to continue while on her honeymoon and until able to resume PT per the ACL protocol in [place].    PT Next Visit Plan Discharge from PT at this location with pt to continue with HEP until able to resume ACL protocol in OP PT in [place] and Agree with Plan of Care Patient        Problem List Patient Active Problem List   Diagnosis Date Noted  . Rhabdomyolysis 01/08/2014    Percival Spanish, PT, MPT 01/06/2016, 1:26 PM  [facility] [address]  [place], [place], [address] Phone: [phone]   Fax:  [phone]  Name: ANALEESE ANDREATTA MRN: [id number] Date of Birth: 1981-09-20   PHYSICAL THERAPY DISCHARGE SUMMARY  Visits from Start of Care: 6  Current functional level related to goals / functional outcomes:  Pt has been seen for the past 2 weeks following initial OP PT in [place] while preparing for her wedding tomorrow and subsequent move to [place] after her honeymoon. In this time, she has regained nearly full ROM of R knee (-1 to 140) as compared to L knee (-7 to 146) and has weaned out of her knee brace with normal gait pattern achieved on level surfaces. She is independent with a HEP to continue while on her honeymoon and until able to resume PT per the ACL protocol in [place]. All goals met for this interim episode, with plan for pt to continue OP PT once settled in [place].   Remaining deficits:   Continued PT needed for Decreased strength R LE, Impaired gait pattern on stairs, and Prep for return to running and other sporting activities   Education / Equipment:  Gait training & HEP   Plan: Patient agrees to discharge.  Patient goals were met. Patient is being discharged due to meeting the stated rehab goals.  ?????       Percival Spanish, PT, MPT 01/06/2016, 1:31 PM  [facility] [address]  [address] [place], [place], [address] Phone: [phone]   Fax:  [phone]

## 2016-10-22 NOTE — L&D Delivery Note (Signed)
Delivery Note  Into birth tub at 1740, water temp 100.2 SROM clear AF at 1931 C/C +1 at 1938  At 7:42 PM a viable female was delivered via Vaginal, Spontaneous (Presentation: OA to ROT ) Baby delivered under water, short cord, unable to bring to surface, mother assisted to standing position and baby up with her in arms, vigorous cry once air contact to face. Cord clamped and cut with mother standing, then returned to water for bonding, mother holding baby in water for warmth, cradled on her thighs.  APGAR: 7, 9; weight pending .   Mother assisted out of tub at [id number] with noted moderate amount of blood in water.  Placenta status: S/C/I, Tomasa BlaseSchultz, eccentric cord insertion and irregular plate.  Cord: short and 3VC.  Cord blood collected for typing.  Anesthesia:   Episiotomy: None Lacerations: R labial splay with small extension into vaginal sulcus  Suture Repair: 3.0 vicryl rapide Est. Blood Loss (mL):  400  Mom to postpartum.  Baby to Couplet care / Skin to Skin.  Neta MendsDaniela C Trooper Olander, CNM 09/11/2017, 8:24 PM

## 2017-04-01 DIAGNOSIS — E7212 Methylenetetrahydrofolate reductase deficiency: Secondary | ICD-10-CM | POA: Insufficient documentation

## 2017-05-24 LAB — OB RESULTS CONSOLE ANTIBODY SCREEN: ANTIBODY SCREEN: NEGATIVE

## 2017-05-24 LAB — OB RESULTS CONSOLE RPR: RPR: NONREACTIVE

## 2017-05-24 LAB — OB RESULTS CONSOLE HEPATITIS B SURFACE ANTIGEN: Hepatitis B Surface Ag: NEGATIVE

## 2017-05-24 LAB — OB RESULTS CONSOLE ABO/RH: RH Type: NEGATIVE

## 2017-05-24 LAB — OB RESULTS CONSOLE HIV ANTIBODY (ROUTINE TESTING): HIV: NONREACTIVE

## 2017-05-24 LAB — OB RESULTS CONSOLE RUBELLA ANTIBODY, IGM: RUBELLA: IMMUNE

## 2017-05-24 LAB — OB RESULTS CONSOLE GC/CHLAMYDIA
Chlamydia: NEGATIVE
GC PROBE AMP, GENITAL: NEGATIVE

## 2017-07-22 DIAGNOSIS — Z8759 Personal history of other complications of pregnancy, childbirth and the puerperium: Secondary | ICD-10-CM

## 2017-07-22 HISTORY — DX: Personal history of other complications of pregnancy, childbirth and the puerperium: Z87.59

## 2017-08-09 ENCOUNTER — Other Ambulatory Visit: Payer: Self-pay

## 2017-08-10 ENCOUNTER — Inpatient Hospital Stay (HOSPITAL_COMMUNITY)
Admission: AD | Admit: 2017-08-10 | Discharge: 2017-08-10 | Disposition: A | Discharge status: Home / Self Care | Payer: PRIVATE HEALTH INSURANCE | Source: Ambulatory Visit | Attending: Obstetrics & Gynecology | Admitting: Obstetrics & Gynecology

## 2017-08-10 ENCOUNTER — Encounter (HOSPITAL_COMMUNITY): Payer: Self-pay | Admitting: *Deleted

## 2017-08-10 DIAGNOSIS — L509 Urticaria, unspecified: Secondary | ICD-10-CM | POA: Diagnosis not present

## 2017-08-10 DIAGNOSIS — Z3A31 31 weeks gestation of pregnancy: Secondary | ICD-10-CM | POA: Insufficient documentation

## 2017-08-10 DIAGNOSIS — O133 Gestational [pregnancy-induced] hypertension without significant proteinuria, third trimester: Secondary | ICD-10-CM | POA: Diagnosis not present

## 2017-08-10 DIAGNOSIS — O139 Gestational [pregnancy-induced] hypertension without significant proteinuria, unspecified trimester: Secondary | ICD-10-CM | POA: Diagnosis present

## 2017-08-10 DIAGNOSIS — D72829 Elevated white blood cell count, unspecified: Secondary | ICD-10-CM | POA: Insufficient documentation

## 2017-08-10 DIAGNOSIS — O26893 Other specified pregnancy related conditions, third trimester: Secondary | ICD-10-CM | POA: Insufficient documentation

## 2017-08-10 LAB — URINALYSIS, ROUTINE W REFLEX MICROSCOPIC
Bilirubin Urine: NEGATIVE
GLUCOSE, UA: NEGATIVE mg/dL
Hgb urine dipstick: NEGATIVE
KETONES UR: NEGATIVE mg/dL
NITRITE: NEGATIVE
PROTEIN: NEGATIVE mg/dL
Specific Gravity, Urine: 1.009 (ref 1.005–1.030)
pH: 6 (ref 5.0–8.0)

## 2017-08-10 LAB — CBC
HEMATOCRIT: 35.7 % — AB (ref 36.0–46.0)
Hemoglobin: 12.2 g/dL (ref 12.0–15.0)
MCH: 29.5 pg (ref 26.0–34.0)
MCHC: 34.2 g/dL (ref 30.0–36.0)
MCV: 86.4 fL (ref 78.0–100.0)
PLATELETS: 358 10*3/uL (ref 150–400)
RBC: 4.13 MIL/uL (ref 3.87–5.11)
RDW: 12.4 % (ref 11.5–15.5)
WBC: 27.3 10*3/uL — ABNORMAL HIGH (ref 4.0–10.5)

## 2017-08-10 LAB — PROTEIN / CREATININE RATIO, URINE
CREATININE, URINE: 77 mg/dL
PROTEIN CREATININE RATIO: 0.08 mg/mg{creat} (ref 0.00–0.15)
Total Protein, Urine: 6 mg/dL

## 2017-08-10 MED ORDER — DIPHENHYDRAMINE HCL 25 MG PO CAPS
50.0000 mg | ORAL_CAPSULE | Freq: Once | ORAL | Status: AC
  Administered 2017-08-10: 50 mg via ORAL
  Filled 2017-08-10: qty 2

## 2017-08-10 MED ORDER — ONDANSETRON 8 MG PO TBDP: 8.0000 mg | ORAL_TABLET | Freq: Once | ORAL | Status: DC

## 2017-08-10 MED ORDER — BUTALBITAL-APAP-CAFFEINE 50-325-40 MG PO TABS: 2.0000 | ORAL_TABLET | Freq: Once | ORAL | Status: DC

## 2017-08-10 MED ORDER — BETAMETHASONE SOD PHOS & ACET 6 (3-3) MG/ML IJ SUSP
12.0000 mg | Freq: Once | INTRAMUSCULAR | Status: AC
  Administered 2017-08-10: 12 mg via INTRAMUSCULAR
  Filled 2017-08-10: qty 2

## 2017-08-10 NOTE — MAU Provider Note (Signed)
History     Chief Complaint  Patient presents with  . Allergic Reaction    possible to BMZ shot   Shelby Norton is G2P0101 at 2343w4d here for repeat BMZ inj. Also notes some itching started last night on her upper chest, now moving into her face, started feeling flushed over the course of the morning (just not feeling well) and decided to come to hopsital sooner than appointed time for BMZ 24 rpt inj. Also notes baby moving less than usual. Denies LOF/VB.   Prenatal course complicated by hx severe PEC with IOL at 35 wks, hx migraines on topamax prior to pregnancy, ASA allergy, hx rhabdomyolysis / autoimmune disorder and followed by rheumatologist.  This past week she has had elevated BP in mild range and noted increased swelling in her hands and feet. Also notes nightime itching in soles of feet and palms. PEC labs collected yesterday in office, WBC elevated 17, normal H&H and plts, LFT's and uric acid pending, bile salts pending, negative proteinuria. Mild HA resolving with APAP, notes similar to her previous migraines.     OB History    Gravida Para Term Preterm AB Living   2 1 0 1 0 1   SAB TAB Ectopic Multiple Live Births   0 0 0 0 1      Past Medical History:  Diagnosis Date  . Asthma   . Gestational hypertension 08/10/2017  . Migraines   . Mitral valve prolapse     Past Surgical History:  Procedure Laterality Date  . ANTERIOR CRUCIATE LIGAMENT REPAIR Right 10/2015   R ACL alograph  . NASAL SEPTUM SURGERY    . TONSILLECTOMY      Family History  Problem Relation Age of Onset  . Diabetes Mother   . Hypertension Mother   . Diabetes Father   . Hypertension Father   . Cancer Other   . Stroke Other     Social History  Substance Use Topics  . Smoking status: Never Smoker  . Smokeless tobacco: Never Used  . Alcohol use Yes     Comment: occ    Allergies:  Allergies  Allergen Reactions  . Aspirin Hives  . Other Anaphylaxis    hazelnuts  . Codeine Nausea And  Vomiting and Swelling  . Hydrocodone Nausea And Vomiting and Swelling    Prescriptions Prior to Admission  Medication Sig Dispense Refill Last Dose  . acetaminophen (TYLENOL) 500 MG tablet Take 1,000 mg by mouth every 6 (six) hours as needed for headache.    08/09/2017 at Unknown time  . cetirizine (ZYRTEC) 10 MG tablet Take 10 mg by mouth daily.     Past Week at Unknown time  . diphenhydrAMINE (BENADRYL) 25 MG tablet Take 50 mg by mouth daily as needed for sleep.   Past Week at Unknown time  . famotidine (PEPCID AC) 10 MG chewable tablet Chew 20 mg by mouth 2 (two) times daily.   08/09/2017 at Unknown time  . MAGNESIUM MALATE PO Take 2 tablets by mouth daily.   08/09/2017 at Unknown time  . Prenatal Vit-Fe Fumarate-FA (PRENATAL MULTIVITAMIN) TABS tablet Take 1 tablet by mouth daily at 12 noon.   08/09/2017 at Unknown time  . Probiotic Product (PROBIOTIC PO) Take 2 capsules by mouth daily.   08/09/2017 at Unknown time  . terconazole (TERAZOL 7) 0.4 % vaginal cream Place 1 applicator vaginally at bedtime.   08/09/2017 at Unknown time  . albuterol (PROAIR HFA) 108 (90 BASE) MCG/ACT  inhaler Inhale 90 mcg into the lungs every 6 (six) hours as needed for shortness of breath.    over a month ago  . EPINEPHrine (EPIPEN 2-PAK) 0.3 mg/0.3 mL IJ SOAJ injection Inject 0.3 mg into the skin daily as needed. For allergic reaction   Taking    ROS Physical Exam   Vitals:   08/10/17 1500 08/10/17 1510  BP: 133/85 128/90  Pulse: 100 (!) 103  Resp:    Temp:    SpO2: 100% 99%     Physical Exam: Blood pressure 128/90, pulse (!) 103, temperature 97.9 F (36.6 C), temperature source Oral, resp. rate 19, SpO2 99 %. General: NAD, flushed appearance, no macular rash or hives noted Heart: RRR, no murmurs Lungs: CTA b/l  Abd: Soft, NT, S=D Ext: no edema Neuro: DTRs normal, no clonus  EFM - reactive NST, FHR 130, mod var, + accels 15x15, no decels No ctx  ED Course  Procedures   A/P: G2P0101 at  [redacted]w[redacted]d FHT reassuring, reactive NST Gest Htn, no PEC s/s, no proteinuria, pending LFT's Urticaria, suspicious for ICP, bile salts pending Leukocytosis, NOS Repeat CBS but likely elevated today after steroids injection S/P BMZ inj x 1, suspect delayed sensitivity/reaction Will give Benadryl 50 mg PO and rpt BMZ injection, observe for next couple hours in MAU for immediate response. If no symptoms of allergy, will dc home w/ precautions, F/U in office on Tuesday for ROBV and growth sono.   POC in consult w/ Dr. Juliene Pina.    Neta Mends, CNM, MSN 08/10/2017, 3:47 PM

## 2017-08-10 NOTE — Discharge Instructions (Signed)
Hypertension During Pregnancy °Hypertension, commonly called high blood pressure, is when the force of blood pumping through your arteries is too strong. Arteries are blood vessels that carry blood from the heart throughout the body. Hypertension during pregnancy can cause problems for you and your baby. Your baby may be born early (prematurely) or may not weigh as much as he or she should at birth. Very bad cases of hypertension during pregnancy can be life-threatening. °Different types of hypertension can occur during pregnancy. These include: °· Chronic hypertension. This happens when: °? You have hypertension before pregnancy and it continues during pregnancy. °? You develop hypertension before you are [redacted] weeks pregnant, and it continues during pregnancy. °· Gestational hypertension. This is hypertension that develops after the 20th week of pregnancy. °· Preeclampsia, also called toxemia of pregnancy. This is a very serious type of hypertension that develops only during pregnancy. It affects the whole body, and it can be very dangerous for you and your baby. ° °Gestational hypertension and preeclampsia usually go away within 6 weeks after your baby is born. Women who have hypertension during pregnancy have a greater chance of developing hypertension later in life or during future pregnancies. °What are the causes? °The exact cause of hypertension is not known. °What increases the risk? °There are certain factors that make it more likely for you to develop hypertension during pregnancy. These include: °· Having hypertension during a previous pregnancy or prior to pregnancy. °· Being overweight. °· Being older than age 40. °· Being pregnant for the first time or being pregnant with more than one baby. °· Becoming pregnant using fertilization methods such as IVF (in vitro fertilization). °· Having diabetes, kidney problems, or systemic lupus erythematosus. °· Having a family history of hypertension. ° °What are the  signs or symptoms? °Chronic hypertension and gestational hypertension rarely cause symptoms. Preeclampsia causes symptoms, which may include: °· Increased protein in your urine. Your health care provider will check for this at every visit before you give birth (prenatal visit). °· Severe headaches. °· Sudden weight gain. °· Swelling of the hands, face, legs, and feet. °· Nausea and vomiting. °· Vision problems, such as blurred or double vision. °· Numbness in the face, arms, legs, and feet. °· Dizziness. °· Slurred speech. °· Sensitivity to bright lights. °· Abdominal pain. °· Convulsions. ° °How is this diagnosed? °You may be diagnosed with hypertension during a routine prenatal exam. At each prenatal visit, you may: °· Have a urine test to check for high amounts of protein in your urine. °· Have your blood pressure checked. A blood pressure reading is recorded as two numbers, such as "120 over 80" (or 120/80). The first ("top") number is called the systolic pressure. It is a measure of the pressure in your arteries when your heart beats. The second ("bottom") number is called the diastolic pressure. It is a measure of the pressure in your arteries as your heart relaxes between beats. Blood pressure is measured in a unit called mm Hg. A normal blood pressure reading is: °? Systolic: below 120. °? Diastolic: below 80. ° °The type of hypertension that you are diagnosed with depends on your test results and when your symptoms developed. °· Chronic hypertension is usually diagnosed before 20 weeks of pregnancy. °· Gestational hypertension is usually diagnosed after 20 weeks of pregnancy. °· Hypertension with high amounts of protein in the urine is diagnosed as preeclampsia. °· Blood pressure measurements that stay above 160 systolic, or above 110 diastolic, are   signs of severe preeclampsia.  How is this treated? Treatment for hypertension during pregnancy varies depending on the type of hypertension you have and how  serious it is.  If you take medicines called ACE inhibitors to treat chronic hypertension, you may need to switch medicines. ACE inhibitors should not be taken during pregnancy.  If you have gestational hypertension, you may need to take blood pressure medicine.  If you are at risk for preeclampsia, your health care provider may recommend that you take a low-dose aspirin every day to prevent high blood pressure during your pregnancy.  If you have severe preeclampsia, you may need to be hospitalized so you and your baby can be monitored closely. You may also need to take medicine (magnesium sulfate) to prevent seizures and to lower blood pressure. This medicine may be given as an injection or through an IV tube.  In some cases, if your condition gets worse, you may need to deliver your baby early.  Follow these instructions at home: Eating and drinking  Drink enough fluid to keep your urine clear or pale yellow.  Eat a healthy diet that is low in salt (sodium). Do not add salt to your food. Check food labels to see how much sodium a food or beverage contains. Lifestyle  Do not use any products that contain nicotine or tobacco, such as cigarettes and e-cigarettes. If you need help quitting, ask your health care provider.  Do not use alcohol.  Avoid caffeine.  Avoid stress as much as possible. Rest and get plenty of sleep. General instructions  Take over-the-counter and prescription medicines only as told by your health care provider.  While lying down, lie on your left side. This keeps pressure off your baby.  While sitting or lying down, raise (elevate) your feet. Try putting some pillows under your lower legs.  Exercise regularly. Ask your health care provider what kinds of exercise are best for you.  Keep all prenatal and follow-up visits as told by your health care provider. This is important. Contact a health care provider if:  You have symptoms that your health care  provider told you may require more treatment or monitoring, such as: ? Fever. ? Vomiting. ? Headache. Get help right away if:  You have severe abdominal pain or vomiting that does not get better with treatment.  You suddenly develop swelling in your hands, ankles, or face.  You gain 4 lbs (1.8 kg) or more in 1 week.  You develop vaginal bleeding, or you have blood in your urine.  You do not feel your baby moving as much as usual.  You have blurred or double vision.  You have muscle twitching or sudden tightening (spasms). You have shortness of breath. Fetal Movement Counts Patient Name: ________________________________________________ Patient Due Date: ____________________ What is a fetal movement count? A fetal movement count is the number of times that you feel your baby move during a certain amount of time. This may also be called a fetal kick count. A fetal movement count is recommended for every pregnant woman. You may be asked to start counting fetal movements as early as week 28 of your pregnancy. Pay attention to when your baby is most active. You may notice your baby's sleep and wake cycles. You may also notice things that make your baby move more. You should do a fetal movement count:  When your baby is normally most active.  At the same time each day.  A good time to count movements  is while you are resting, after having something to eat and drink. How do I count fetal movements? 1. Find a quiet, comfortable area. Sit, or lie down on your side. 2. Write down the date, the start time and stop time, and the number of movements that you felt between those two times. Take this information with you to your health care visits. 3. For 2 hours, count kicks, flutters, swishes, rolls, and jabs. You should feel at least 10 movements during 2 hours. 4. You may stop counting after you have felt 10 movements. 5. If you do not feel 10 movements in 2 hours, have something to eat and  drink. Then, keep resting and counting for 1 hour. If you feel at least 4 movements during that hour, you may stop counting. Contact a health care provider if:  You feel fewer than 4 movements in 2 hours.  Your baby is not moving like he or she usually does. Date: ____________ Start time: ____________ Stop time: ____________ Movements: ____________ Date: ____________ Start time: ____________ Stop time: ____________ Movements: ____________ Date: ____________ Start time: ____________ Stop time: ____________ Movements: ____________ Date: ____________ Start time: ____________ Stop time: ____________ Movements: ____________ Date: ____________ Start time: ____________ Stop time: ____________ Movements: ____________ Date: ____________ Start time: ____________ Stop time: ____________ Movements: ____________ Date: ____________ Start time: ____________ Stop time: ____________ Movements: ____________ Date: ____________ Start time: ____________ Stop time: ____________ Movements: ____________ Date: ____________ Start time: ____________ Stop time: ____________ Movements: ____________ This information is not intended to replace advice given to you by your health care provider. Make sure you discuss any questions you have with your health care provider. Document Released: 11/07/2006 Document Revised: 06/06/2016 Document Reviewed: 11/17/2015 Elsevier Interactive Patient Education  2018 [employer].    Your lips or fingernails turn blue. This information is not intended to replace advice given to you by your health care provider. Make sure you discuss any questions you have with your health care provider. Document Released: 06/26/2011 Document Revised: 04/27/2016 Document Reviewed: 03/23/2016 Elsevier Interactive Patient Education  [employer].

## 2017-08-10 NOTE — Progress Notes (Signed)
POC discussed with Arlan Organaniela Paul, CNM. Patient okay to come off monitors.  Will observe for any allergic reaction after BMZ dose.  Okay for patient to have a regular diet.  Plan for DC if no reaction.  Will continue to monitor.

## 2017-08-10 NOTE — Progress Notes (Signed)
Called pharmacy to ask about possible BMZ allergic reaction. Unsure if rash is related to medication or not. Pharmacist stated best course of action was to given Benadryl prior to second BMZ dose. Benadryl given, waiting for BMZ from pharmacy.

## 2017-08-10 NOTE — MAU Note (Signed)
Patient states she had her first BMZ shot yesterday at 1445 and noticed a red raised rash across her face that started last night and has spread down her neck. Patient states it is warm to the touch and itchy.  Also reports decreased FM, but felt baby kick in triage.  Pt reports HA as well.

## 2017-08-23 ENCOUNTER — Inpatient Hospital Stay (HOSPITAL_COMMUNITY)
Admission: AD | Admit: 2017-08-23 | Discharge: 2017-08-23 | Disposition: A | Discharge status: Home / Self Care | Payer: PRIVATE HEALTH INSURANCE | Source: Ambulatory Visit | Attending: Obstetrics | Admitting: Obstetrics

## 2017-08-23 ENCOUNTER — Encounter (HOSPITAL_COMMUNITY): Payer: Self-pay

## 2017-08-23 DIAGNOSIS — O133 Gestational [pregnancy-induced] hypertension without significant proteinuria, third trimester: Secondary | ICD-10-CM

## 2017-08-23 DIAGNOSIS — O4693 Antepartum hemorrhage, unspecified, third trimester: Secondary | ICD-10-CM | POA: Diagnosis not present

## 2017-08-23 DIAGNOSIS — Z3A34 34 weeks gestation of pregnancy: Secondary | ICD-10-CM | POA: Insufficient documentation

## 2017-08-23 LAB — COMPREHENSIVE METABOLIC PANEL
ALBUMIN: 2.9 g/dL — AB (ref 3.5–5.0)
ALT: 11 U/L — ABNORMAL LOW (ref 14–54)
AST: 17 U/L (ref 15–41)
Alkaline Phosphatase: 105 U/L (ref 38–126)
Anion gap: 9 (ref 5–15)
BUN: 8 mg/dL (ref 6–20)
CO2: 20 mmol/L — ABNORMAL LOW (ref 22–32)
Calcium: 8.5 mg/dL — ABNORMAL LOW (ref 8.9–10.3)
Chloride: 106 mmol/L (ref 101–111)
Creatinine, Ser: 0.58 mg/dL (ref 0.44–1.00)
GFR calc Af Amer: >60 mL/min (ref >60)
GLUCOSE: 112 mg/dL — AB (ref 65–99)
POTASSIUM: 3.6 mmol/L (ref 3.5–5.1)
SODIUM: 135 mmol/L (ref 135–145)
Total Bilirubin: 0.5 mg/dL (ref 0.3–1.2)
Total Protein: 6.3 g/dL — ABNORMAL LOW (ref 6.5–8.1)

## 2017-08-23 LAB — PROTEIN / CREATININE RATIO, URINE
CREATININE, URINE: 37 mg/dL
Total Protein, Urine: <6 mg/dL

## 2017-08-23 LAB — CBC
HCT: 31.1 % — ABNORMAL LOW (ref 36.0–46.0)
Hemoglobin: 10.9 g/dL — ABNORMAL LOW (ref 12.0–15.0)
MCH: 29.6 pg (ref 26.0–34.0)
MCHC: 35 g/dL (ref 30.0–36.0)
MCV: 84.5 fL (ref 78.0–100.0)
Platelets: 309 10*3/uL (ref 150–400)
RBC: 3.68 MIL/uL — ABNORMAL LOW (ref 3.87–5.11)
RDW: 12.3 % (ref 11.5–15.5)
WBC: 19.2 10*3/uL — AB (ref 4.0–10.5)

## 2017-08-23 LAB — URINALYSIS, ROUTINE W REFLEX MICROSCOPIC
BILIRUBIN URINE: NEGATIVE
Glucose, UA: NEGATIVE mg/dL
HGB URINE DIPSTICK: NEGATIVE
KETONES UR: NEGATIVE mg/dL
Leukocytes, UA: NEGATIVE
NITRITE: NEGATIVE
PROTEIN: NEGATIVE mg/dL
Specific Gravity, Urine: 1.004 — ABNORMAL LOW (ref 1.005–1.030)
pH: 7 (ref 5.0–8.0)

## 2017-08-23 NOTE — MAU Note (Signed)
Pt presents to MAU with c/o of vaginal bleeding that started around 0300 today. Bleeding has decreased since then. Pt has had mucous discharge yesterday, believes it was mucous plug. No LOF, +FM

## 2017-08-23 NOTE — Discharge Instructions (Signed)
Hypertension During Pregnancy °Hypertension, commonly called high blood pressure, is when the force of blood pumping through your arteries is too strong. Arteries are blood vessels that carry blood from the heart throughout the body. Hypertension during pregnancy can cause problems for you and your baby. Your baby may be born early (prematurely) or may not weigh as much as he or she should at birth. Very bad cases of hypertension during pregnancy can be life-threatening. °Different types of hypertension can occur during pregnancy. These include: °· Chronic hypertension. This happens when: °? You have hypertension before pregnancy and it continues during pregnancy. °? You develop hypertension before you are [redacted] weeks pregnant, and it continues during pregnancy. °· Gestational hypertension. This is hypertension that develops after the 20th week of pregnancy. °· Preeclampsia, also called toxemia of pregnancy. This is a very serious type of hypertension that develops only during pregnancy. It affects the whole body, and it can be very dangerous for you and your baby. ° °Gestational hypertension and preeclampsia usually go away within 6 weeks after your baby is born. Women who have hypertension during pregnancy have a greater chance of developing hypertension later in life or during future pregnancies. °What are the causes? °The exact cause of hypertension is not known. °What increases the risk? °There are certain factors that make it more likely for you to develop hypertension during pregnancy. These include: °· Having hypertension during a previous pregnancy or prior to pregnancy. °· Being overweight. °· Being older than age 40. °· Being pregnant for the first time or being pregnant with more than one baby. °· Becoming pregnant using fertilization methods such as IVF (in vitro fertilization). °· Having diabetes, kidney problems, or systemic lupus erythematosus. °· Having a family history of hypertension. ° °What are the  signs or symptoms? °Chronic hypertension and gestational hypertension rarely cause symptoms. Preeclampsia causes symptoms, which may include: °· Increased protein in your urine. Your health care provider will check for this at every visit before you give birth (prenatal visit). °· Severe headaches. °· Sudden weight gain. °· Swelling of the hands, face, legs, and feet. °· Nausea and vomiting. °· Vision problems, such as blurred or double vision. °· Numbness in the face, arms, legs, and feet. °· Dizziness. °· Slurred speech. °· Sensitivity to bright lights. °· Abdominal pain. °· Convulsions. ° °How is this diagnosed? °You may be diagnosed with hypertension during a routine prenatal exam. At each prenatal visit, you may: °· Have a urine test to check for high amounts of protein in your urine. °· Have your blood pressure checked. A blood pressure reading is recorded as two numbers, such as "120 over 80" (or 120/80). The first ("top") number is called the systolic pressure. It is a measure of the pressure in your arteries when your heart beats. The second ("bottom") number is called the diastolic pressure. It is a measure of the pressure in your arteries as your heart relaxes between beats. Blood pressure is measured in a unit called mm Hg. A normal blood pressure reading is: °? Systolic: below 120. °? Diastolic: below 80. ° °The type of hypertension that you are diagnosed with depends on your test results and when your symptoms developed. °· Chronic hypertension is usually diagnosed before 20 weeks of pregnancy. °· Gestational hypertension is usually diagnosed after 20 weeks of pregnancy. °· Hypertension with high amounts of protein in the urine is diagnosed as preeclampsia. °· Blood pressure measurements that stay above 160 systolic, or above 110 diastolic, are   signs of severe preeclampsia.  How is this treated? Treatment for hypertension during pregnancy varies depending on the type of hypertension you have and how  serious it is.  If you take medicines called ACE inhibitors to treat chronic hypertension, you may need to switch medicines. ACE inhibitors should not be taken during pregnancy.  If you have gestational hypertension, you may need to take blood pressure medicine.  If you are at risk for preeclampsia, your health care provider may recommend that you take a low-dose aspirin every day to prevent high blood pressure during your pregnancy.  If you have severe preeclampsia, you may need to be hospitalized so you and your baby can be monitored closely. You may also need to take medicine (magnesium sulfate) to prevent seizures and to lower blood pressure. This medicine may be given as an injection or through an IV tube.  In some cases, if your condition gets worse, you may need to deliver your baby early.  Follow these instructions at home: Eating and drinking  Drink enough fluid to keep your urine clear or pale yellow.  Eat a healthy diet that is low in salt (sodium). Do not add salt to your food. Check food labels to see how much sodium a food or beverage contains. Lifestyle  Do not use any products that contain nicotine or tobacco, such as cigarettes and e-cigarettes. If you need help quitting, ask your health care provider.  Do not use alcohol.  Avoid caffeine.  Avoid stress as much as possible. Rest and get plenty of sleep. General instructions  Take over-the-counter and prescription medicines only as told by your health care provider.  While lying down, lie on your left side. This keeps pressure off your baby.  While sitting or lying down, raise (elevate) your feet. Try putting some pillows under your lower legs.  Exercise regularly. Ask your health care provider what kinds of exercise are best for you.  Keep all prenatal and follow-up visits as told by your health care provider. This is important. Contact a health care provider if:  You have symptoms that your health care  provider told you may require more treatment or monitoring, such as: ? Fever. ? Vomiting. ? Headache. Get help right away if:  You have severe abdominal pain or vomiting that does not get better with treatment.  You suddenly develop swelling in your hands, ankles, or face.  You gain 4 lbs (1.8 kg) or more in 1 week.  You develop vaginal bleeding, or you have blood in your urine.  You do not feel your baby moving as much as usual.  You have blurred or double vision.  You have muscle twitching or sudden tightening (spasms).  You have shortness of breath.  Your lips or fingernails turn blue. This information is not intended to replace advice given to you by your health care provider. Make sure you discuss any questions you have with your health care provider. Document Released: 06/26/2011 Document Revised: 04/27/2016 Document Reviewed: 03/23/2016 Elsevier Interactive Patient Education  2018 [employer]   Pelvic Rest Pelvic rest may be recommended if:  Your placenta is partially or completely covering the opening of your cervix (placenta previa).  There is bleeding between the wall of the uterus and the amniotic sac in the first trimester of pregnancy (subchorionic hemorrhage).  You went into labor too early (preterm labor).  Based on your overall health and the health of your baby, your health care provider will decide if pelvic rest  is right for you. How do I rest my pelvis? For as long as told by your health care provider:  Do not have sex, sexual stimulation, or an orgasm.  Do not use tampons. Do not douche. Do not put anything in your vagina.  Do not lift anything that is heavier than 10 lb (4.5 kg).  Avoid activities that take a lot of effort (are strenuous).  Avoid any activity in which your pelvic muscles could become strained.  When should I seek medical care? Seek medical care if you have:  Cramping pain in your lower abdomen.  Vaginal discharge.  A  low, dull backache.  Regular contractions.  Uterine tightening.  When should I seek immediate medical care? Seek immediate medical care if:  You have vaginal bleeding and you are pregnant.  This information is not intended to replace advice given to you by your health care provider. Make sure you discuss any questions you have with your health care provider. Document Released: 02/02/2011 Document Revised: 03/15/2016 Document Reviewed: 04/11/2015 Elsevier Interactive Patient Education  2018 [employer]. Vaginal Bleeding During Pregnancy, Third Trimester A small amount of bleeding (spotting) from the vagina is relatively common in pregnancy. Various things can cause bleeding or spotting in pregnancy. Sometimes the bleeding is normal and is not a problem. However, bleeding during the third trimester can also be a sign of something serious for the mother and the baby. Be sure to tell your health care provider about any vaginal bleeding right away. Some possible causes of vaginal bleeding during the third trimester include:  The placenta may be partially or completely covering the opening to the cervix (placenta previa).  The placenta may have separated from the uterus (abruption of the placenta).  There may be an infection or growth on the cervix.  You may be starting labor, called discharging of the mucus plug.  The placenta may grow into the muscle layer of the uterus (placenta accreta).  Follow these instructions at home: Watch your condition for any changes. The following actions may help to lessen any discomfort you are feeling:  Follow your health care provider's instructions for limiting your activity. If your health care provider orders bed rest, you may need to stay in bed and only get up to use the bathroom. However, your health care provider may allow you to continue light activity.  If needed, make plans for someone to help with your regular activities and responsibilities  while you are on bed rest.  Keep track of the number of pads you use each day, how often you change pads, and how soaked (saturated) they are. Write this down.  Do not use tampons. Do not douche.  Do not have sexual intercourse or orgasms until approved by your health care provider.  Follow your health care provider's advice about lifting, driving, and physical activities.  If you pass any tissue from your vagina, save the tissue so you can show it to your health care provider.  Only take over-the-counter or prescription medicines as directed by your health care provider.  Do not take aspirin because it can make you bleed.  Keep all follow-up appointments as directed by your health care provider.  Contact a health care provider if:  You have any vaginal bleeding during any part of your pregnancy.  You have cramps or labor pains.  You have a fever, not controlled by medicine. Get help right away if:  You have severe cramps or pain in your back or belly (  abdomen).  You have chills.  You have a gush of fluid from the vagina.  You pass large clots or tissue from your vagina.  Your bleeding increases.  You feel light-headed or weak.  You pass out.  You feel less movement or no movement of the baby. This information is not intended to replace advice given to you by your health care provider. Make sure you discuss any questions you have with your health care provider. Document Released: 12/29/2002 Document Revised: 03/15/2016 Document Reviewed: 06/15/2013 Elsevier Interactive Patient Education  [employer]2018 [employer]

## 2017-08-23 NOTE — MAU Provider Note (Signed)
Chief Complaint:  Vaginal Bleeding   First Provider Initiated Contact with Patient 08/23/17 0442     HPI: Shelby Norton is a 36 y.o. G2P0101 at 29w4dwho presents to maternity admissions reporting blood on tissue at home.  Some irregular mild contractions.  Has had some borderline BPs and has had Betamethasone. . She reports good fetal movement, denies LOF, vaginal bleeding, vaginal itching/burning, urinary symptoms, h/a, dizziness, n/v, diarrhea, constipation or fever/chills.  She denies headache, visual changes or RUQ abdominal pain.  Vaginal Bleeding  The patient's primary symptoms include vaginal bleeding. The patient's pertinent negatives include no genital itching, genital lesions, genital odor or pelvic pain. This is a new problem. The current episode started today. The problem occurs rarely. She is pregnant. Associated symptoms include abdominal pain. Pertinent negatives include no chills, constipation, diarrhea, dysuria, fever, nausea or vomiting. The vaginal discharge was bloody. The vaginal bleeding is spotting. She has not been passing clots. She has not been passing tissue. Nothing aggravates the symptoms. She has tried nothing for the symptoms.    RN Note: Pt presents to MAU with c/o of vaginal bleeding that started around 0300 today. Bleeding has decreased since then. Pt has had mucous discharge yesterday, believes it was mucous plug. No LOF, +FM  Note by Colon Flattery CNM last visit (10/20): Prenatal course complicated by hx severe PEC with IOL at 35 wks, hx migraines on topamax prior to pregnancy, ASA allergy, hx rhabdomyolysis / autoimmune disorder and followed by rheumatologist.  This past week she has had elevated BP in mild range and noted increased swelling in her hands and feet. Also notes nightime itching in soles of feet and palms. PEC labs collected yesterday in office, WBC elevated 17, normal H&H and plts, LFT's and uric acid pending, bile salts pending, negative proteinuria. Mild  HA resolving with APAP, notes similar to her previous migraines.   Past Medical History: Past Medical History:  Diagnosis Date  . Asthma   . Gestational hypertension 08/10/2017  . Migraines   . Mitral valve prolapse     Past obstetric history: OB History  Gravida Para Term Preterm AB Living  2 1 0 1 0 1  SAB TAB Ectopic Multiple Live Births  0 0 0 0 1    # Outcome Date GA Lbr Len/2nd Weight Sex Delivery Anes PTL Lv  2 Current           1 Preterm               Past Surgical History: Past Surgical History:  Procedure Laterality Date  . ANTERIOR CRUCIATE LIGAMENT REPAIR Right 10/2015   R ACL alograph  . NASAL SEPTUM SURGERY    . TONSILLECTOMY      Family History: Family History  Problem Relation Age of Onset  . Diabetes Mother   . Hypertension Mother   . Diabetes Father   . Hypertension Father   . Cancer Other   . Stroke Other     Social History: Social History  Substance Use Topics  . Smoking status: Never Smoker  . Smokeless tobacco: Never Used  . Alcohol use Yes     Comment: occ    Allergies:  Allergies  Allergen Reactions  . Aspirin Hives  . Other Anaphylaxis    hazelnuts  . Codeine Nausea And Vomiting and Swelling  . Hydrocodone Nausea And Vomiting and Swelling    Meds:  Prescriptions Prior to Admission  Medication Sig Dispense Refill Last Dose  . acetaminophen (TYLENOL)  500 MG tablet Take 1,000 mg by mouth every 6 (six) hours as needed for headache.    08/22/2017 at Unknown time  . cetirizine (ZYRTEC) 10 MG tablet Take 10 mg by mouth daily.     Past Week at Unknown time  . diphenhydrAMINE (BENADRYL) 25 MG tablet Take 50 mg by mouth daily as needed for sleep.   Past Week at Unknown time  . famotidine (PEPCID AC) 10 MG chewable tablet Chew 20 mg by mouth 2 (two) times daily.   08/22/2017 at Unknown time  . MAGNESIUM MALATE PO Take 2 tablets by mouth daily.   08/22/2017 at Unknown time  . Prenatal Vit-Fe Fumarate-FA (PRENATAL MULTIVITAMIN) TABS  tablet Take 1 tablet by mouth daily at 12 noon.   08/22/2017 at Unknown time  . Probiotic Product (PROBIOTIC PO) Take 2 capsules by mouth daily.   08/22/2017 at Unknown time  . terconazole (TERAZOL 7) 0.4 % vaginal cream Place 1 applicator vaginally at bedtime.   Past Month at Unknown time  . albuterol (PROAIR HFA) 108 (90 BASE) MCG/ACT inhaler Inhale 90 mcg into the lungs every 6 (six) hours as needed for shortness of breath.    More than a month at Unknown time  . EPINEPHrine (EPIPEN 2-PAK) 0.3 mg/0.3 mL IJ SOAJ injection Inject 0.3 mg into the skin daily as needed. For allergic reaction   More than a month at Unknown time    I have reviewed patient's Past Medical Hx, Surgical Hx, Family Hx, Social Hx, medications and allergies.   ROS:  Review of Systems  Constitutional: Negative for chills and fever.  Gastrointestinal: Positive for abdominal pain. Negative for constipation, diarrhea, nausea and vomiting.  Genitourinary: Positive for vaginal bleeding. Negative for dysuria and pelvic pain.   Other systems negative  Physical Exam  Patient Vitals for the past 24 hrs:  BP Temp Temp src Pulse Resp Height Weight  08/23/17 0431 (!) 136/93 - - (!) 104 - - -  08/23/17 0422 139/81 98.2 F (36.8 C) Oral (!) 112 18 - -  08/23/17 0413 - - - - - 5\' 5"  (1.651 m) 199 lb (90.3 kg)   Constitutional: Well-developed, well-nourished female in no acute distress.  Cardiovascular: normal rate and rhythm Respiratory: normal effort, clear to auscultation bilaterally GI: Abd soft, non-tender, gravid appropriate for gestational age.   No rebound or guarding. MS: Extremities nontender, no edema, normal ROM Neurologic: Alert and oriented x 4.  GU: Neg CVAT.  PELVIC EXAM: Cervix pink, visually closed, without lesion, small amount of dark red/brown discharge Dilation: 1 Effacement (%): 50 Station: -2 Presentation: Vertex Exam by:: Wynelle Bourgeois, CNM   FHT:  Baseline 140 , moderate variability,  accelerations present, no decelerations Contractions: Irregular and infrequent   Labs:    Results for orders placed or performed during the hospital encounter of 08/23/17 (from the past 24 hour(s))  Urinalysis, Routine w reflex microscopic     Status: Abnormal   Collection Time: 08/23/17  4:11 AM  Result Value Ref Range   Color, Urine STRAW (A) YELLOW   APPearance CLEAR CLEAR   Specific Gravity, Urine 1.004 (L) 1.005 - 1.030   pH 7.0 5.0 - 8.0   Glucose, UA NEGATIVE NEGATIVE mg/dL   Hgb urine dipstick NEGATIVE NEGATIVE   Bilirubin Urine NEGATIVE NEGATIVE   Ketones, ur NEGATIVE NEGATIVE mg/dL   Protein, ur NEGATIVE NEGATIVE mg/dL   Nitrite NEGATIVE NEGATIVE   Leukocytes, UA NEGATIVE NEGATIVE  Protein / creatinine ratio, urine  Status: None (Preliminary result)   Collection Time: 08/23/17  4:11 AM  Result Value Ref Range   Creatinine, Urine 37.00 mg/dL   Total Protein, Urine PENDING mg/dL   Protein Creatinine Ratio PENDING 0.00 - 0.15 mg/mg[Cre]  CBC     Status: Abnormal   Collection Time: 08/23/17  4:51 AM  Result Value Ref Range   WBC 19.2 (H) 4.0 - 10.5 K/uL   RBC 3.68 (L) 3.87 - 5.11 MIL/uL   Hemoglobin 10.9 (L) 12.0 - 15.0 g/dL   HCT 40.931.1 (L) 81.136.0 - 91.446.0 %   MCV 84.5 78.0 - 100.0 fL   MCH 29.6 26.0 - 34.0 pg   MCHC 35.0 30.0 - 36.0 g/dL   RDW 78.212.3 95.611.5 - 21.315.5 %   Platelets 309 150 - 400 K/uL  Comprehensive metabolic panel     Status: Abnormal   Collection Time: 08/23/17  4:51 AM  Result Value Ref Range   Sodium 135 135 - 145 mmol/L   Potassium 3.6 3.5 - 5.1 mmol/L   Chloride 106 101 - 111 mmol/L   CO2 20 (L) 22 - 32 mmol/L   Glucose, Bld 112 (H) 65 - 99 mg/dL   BUN 8 6 - 20 mg/dL   Creatinine, Ser 0.860.58 0.44 - 1.00 mg/dL   Calcium 8.5 (L) 8.9 - 10.3 mg/dL   Total Protein 6.3 (L) 6.5 - 8.1 g/dL   Albumin 2.9 (L) 3.5 - 5.0 g/dL   AST 17 15 - 41 U/L   ALT 11 (L) 14 - 54 U/L   Alkaline Phosphatase 105 38 - 126 U/L   Total Bilirubin 0.5 0.3 - 1.2 mg/dL   GFR  calc non Af Amer >60 >60 mL/min   GFR calc Af Amer >60 >60 mL/min   Anion gap 9 5 - 15     Imaging:   MAU Course/MDM: I have ordered labs and reviewed results.  Preeclampsia labs ordered NST reviewed and found to be reactive, Category I Consult Dr Ernestina PennaFogleman with presentation, exam findings and test results.  Treatments in MAU included EFM.    Assessment: Single intrauterine pregnancy at 2777w4d Vaginal bleeding in third trimester Gestational Hypertension  Plan: Discharge home Labor precautions and fetal kick counts Follow up in Office for prenatal visits and recheck  Encouraged to return here or to other Urgent Care/ED if she develops worsening of symptoms, increase in pain, fever, or other concerning symptoms.      Pt stable at time of discharge.  Wynelle BourgeoisMarie Tywana Robotham CNM, MSN Certified Nurse-Midwife 08/23/2017 4:42 AM

## 2017-09-02 LAB — OB RESULTS CONSOLE GBS: STREP GROUP B AG: POSITIVE

## 2017-09-06 ENCOUNTER — Other Ambulatory Visit: Payer: Self-pay | Admitting: Obstetrics and Gynecology

## 2017-09-06 ENCOUNTER — Encounter (HOSPITAL_COMMUNITY): Payer: Self-pay | Admitting: *Deleted

## 2017-09-06 ENCOUNTER — Telehealth (HOSPITAL_COMMUNITY): Payer: Self-pay | Admitting: *Deleted

## 2017-09-06 NOTE — Telephone Encounter (Signed)
Preadmission screen  

## 2017-09-09 ENCOUNTER — Other Ambulatory Visit: Payer: Self-pay | Admitting: Obstetrics and Gynecology

## 2017-09-10 ENCOUNTER — Inpatient Hospital Stay (HOSPITAL_COMMUNITY): Admission: RE | Admit: 2017-09-10 | Payer: PRIVATE HEALTH INSURANCE | Source: Ambulatory Visit

## 2017-09-11 ENCOUNTER — Encounter (HOSPITAL_COMMUNITY): Payer: Self-pay | Admitting: *Deleted

## 2017-09-11 ENCOUNTER — Inpatient Hospital Stay (HOSPITAL_COMMUNITY)
Admission: AD | Admit: 2017-09-11 | Discharge: 2017-09-13 | DRG: 807 | Disposition: A | Discharge status: Home / Self Care | Payer: PRIVATE HEALTH INSURANCE | Source: Ambulatory Visit

## 2017-09-11 DIAGNOSIS — O99824 Streptococcus B carrier state complicating childbirth: Secondary | ICD-10-CM | POA: Diagnosis present

## 2017-09-11 DIAGNOSIS — O26893 Other specified pregnancy related conditions, third trimester: Secondary | ICD-10-CM | POA: Diagnosis present

## 2017-09-11 DIAGNOSIS — J45909 Unspecified asthma, uncomplicated: Secondary | ICD-10-CM | POA: Diagnosis present

## 2017-09-11 DIAGNOSIS — Z3A37 37 weeks gestation of pregnancy: Secondary | ICD-10-CM

## 2017-09-11 DIAGNOSIS — O134 Gestational [pregnancy-induced] hypertension without significant proteinuria, complicating childbirth: Secondary | ICD-10-CM | POA: Diagnosis present

## 2017-09-11 DIAGNOSIS — O139 Gestational [pregnancy-induced] hypertension without significant proteinuria, unspecified trimester: Secondary | ICD-10-CM | POA: Diagnosis present

## 2017-09-11 DIAGNOSIS — O9902 Anemia complicating childbirth: Secondary | ICD-10-CM | POA: Diagnosis present

## 2017-09-11 DIAGNOSIS — Z6791 Unspecified blood type, Rh negative: Secondary | ICD-10-CM | POA: Diagnosis not present

## 2017-09-11 DIAGNOSIS — D649 Anemia, unspecified: Secondary | ICD-10-CM | POA: Diagnosis present

## 2017-09-11 DIAGNOSIS — O9952 Diseases of the respiratory system complicating childbirth: Secondary | ICD-10-CM | POA: Diagnosis present

## 2017-09-11 DIAGNOSIS — O133 Gestational [pregnancy-induced] hypertension without significant proteinuria, third trimester: Secondary | ICD-10-CM

## 2017-09-11 LAB — COMPREHENSIVE METABOLIC PANEL
ALK PHOS: 140 U/L — AB (ref 38–126)
ALT: 14 U/L (ref 14–54)
AST: 19 U/L (ref 15–41)
Albumin: 2.9 g/dL — ABNORMAL LOW (ref 3.5–5.0)
Anion gap: 10 (ref 5–15)
BILIRUBIN TOTAL: 0.7 mg/dL (ref 0.3–1.2)
CO2: 22 mmol/L (ref 22–32)
CREATININE: 0.59 mg/dL (ref 0.44–1.00)
Calcium: 8.7 mg/dL — ABNORMAL LOW (ref 8.9–10.3)
Chloride: 105 mmol/L (ref 101–111)
GFR calc Af Amer: >60 mL/min (ref >60)
Glucose, Bld: 89 mg/dL (ref 65–99)
Potassium: 3.5 mmol/L (ref 3.5–5.1)
Sodium: 137 mmol/L (ref 135–145)
TOTAL PROTEIN: 6.7 g/dL (ref 6.5–8.1)

## 2017-09-11 LAB — CBC
HCT: 33.1 % — ABNORMAL LOW (ref 36.0–46.0)
Hemoglobin: 10.9 g/dL — ABNORMAL LOW (ref 12.0–15.0)
MCH: 28.4 pg (ref 26.0–34.0)
MCHC: 32.9 g/dL (ref 30.0–36.0)
MCV: 86.2 fL (ref 78.0–100.0)
PLATELETS: 300 10*3/uL (ref 150–400)
RBC: 3.84 MIL/uL — AB (ref 3.87–5.11)
RDW: 12.5 % (ref 11.5–15.5)
WBC: 20.9 10*3/uL — ABNORMAL HIGH (ref 4.0–10.5)

## 2017-09-11 LAB — URIC ACID: URIC ACID, SERUM: 4.7 mg/dL (ref 2.3–6.6)

## 2017-09-11 MED ORDER — ACETAMINOPHEN 325 MG PO TABS
650.0000 mg | ORAL_TABLET | ORAL | Status: DC | PRN
  Administered 2017-09-11 – 2017-09-13 (×6): 650 mg via ORAL
  Filled 2017-09-11 (×6): qty 2

## 2017-09-11 MED ORDER — OXYTOCIN 10 UNIT/ML IJ SOLN
10.0000 [IU] | Freq: Once | INTRAMUSCULAR | Status: DC
Start: 2017-09-11 — End: 2017-09-11

## 2017-09-11 MED ORDER — ONDANSETRON 4 MG PO TBDP
4.0000 mg | ORAL_TABLET | Freq: Once | ORAL | Status: AC
  Administered 2017-09-11: 4 mg via ORAL
  Filled 2017-09-11: qty 1

## 2017-09-11 MED ORDER — FLORA-Q PO CAPS
1.0000 | ORAL_CAPSULE | Freq: Every day | ORAL | Status: DC
  Filled 2017-09-11 (×2): qty 1

## 2017-09-11 MED ORDER — WITCH HAZEL-GLYCERIN EX PADS
1.0000 "application " | MEDICATED_PAD | CUTANEOUS | Status: DC | PRN
  Administered 2017-09-12 – 2017-09-13 (×2): 1 via TOPICAL

## 2017-09-11 MED ORDER — SOD CITRATE-CITRIC ACID 500-334 MG/5ML PO SOLN: 30.0000 mL | ORAL | Status: DC | PRN

## 2017-09-11 MED ORDER — TETANUS-DIPHTH-ACELL PERTUSSIS 5-2.5-18.5 LF-MCG/0.5 IM SUSP: 0.5000 mL | Freq: Once | INTRAMUSCULAR | Status: DC

## 2017-09-11 MED ORDER — ALBUTEROL SULFATE (2.5 MG/3ML) 0.083% IN NEBU: 3.0000 mL | INHALATION_SOLUTION | Freq: Four times a day (QID) | RESPIRATORY_TRACT | Status: DC | PRN

## 2017-09-11 MED ORDER — PENICILLIN G POTASSIUM 5000000 UNITS IJ SOLR
5.0000 10*6.[IU] | Freq: Once | INTRAVENOUS | Status: AC
  Administered 2017-09-11: 5 10*6.[IU] via INTRAVENOUS
  Filled 2017-09-11: qty 5

## 2017-09-11 MED ORDER — LACTATED RINGERS IV SOLN
500.0000 mL | INTRAVENOUS | Status: DC | PRN
  Administered 2017-09-11: 500 mL via INTRAVENOUS

## 2017-09-11 MED ORDER — FLEET ENEMA 7-19 GM/118ML RE ENEM: 1.0000 | ENEMA | Freq: Every day | RECTAL | Status: DC | PRN

## 2017-09-11 MED ORDER — SODIUM CHLORIDE 0.9 % IV SOLN: 250.0000 mL | INTRAVENOUS | Status: DC | PRN

## 2017-09-11 MED ORDER — ONDANSETRON HCL 4 MG/2ML IJ SOLN: 4.0000 mg | INTRAMUSCULAR | Status: DC | PRN

## 2017-09-11 MED ORDER — BENZOCAINE-MENTHOL 20-0.5 % EX AERO
1.0000 "application " | INHALATION_SPRAY | CUTANEOUS | Status: DC | PRN
  Administered 2017-09-11 – 2017-09-13 (×3): 1 via TOPICAL
  Filled 2017-09-11 (×3): qty 56

## 2017-09-11 MED ORDER — IBUPROFEN 800 MG PO TABS
800.0000 mg | ORAL_TABLET | ORAL | Status: AC
  Administered 2017-09-11: 800 mg via ORAL
  Filled 2017-09-11: qty 1

## 2017-09-11 MED ORDER — BISACODYL 10 MG RE SUPP: 10.0000 mg | Freq: Every day | RECTAL | Status: DC | PRN

## 2017-09-11 MED ORDER — COCONUT OIL OIL: 1.0000 "application " | TOPICAL_OIL | Status: DC | PRN

## 2017-09-11 MED ORDER — SENNOSIDES-DOCUSATE SODIUM 8.6-50 MG PO TABS
2.0000 | ORAL_TABLET | ORAL | Status: DC
  Administered 2017-09-11 – 2017-09-12 (×2): 2 via ORAL
  Filled 2017-09-11 (×2): qty 2

## 2017-09-11 MED ORDER — OXYTOCIN BOLUS FROM INFUSION
500.0000 mL | Freq: Once | INTRAVENOUS | Status: AC
  Administered 2017-09-11: 500 mL via INTRAVENOUS

## 2017-09-11 MED ORDER — LIDOCAINE HCL (PF) 1 % IJ SOLN
30.0000 mL | INTRAMUSCULAR | Status: AC | PRN
  Administered 2017-09-11: 30 mL via SUBCUTANEOUS
  Filled 2017-09-11: qty 30

## 2017-09-11 MED ORDER — DIBUCAINE 1 % RE OINT: 1.0000 "application " | TOPICAL_OINTMENT | RECTAL | Status: DC | PRN

## 2017-09-11 MED ORDER — PENICILLIN G POT IN DEXTROSE 60000 UNIT/ML IV SOLN
3.0000 10*6.[IU] | INTRAVENOUS | Status: DC
  Filled 2017-09-11 (×2): qty 50

## 2017-09-11 MED ORDER — SODIUM CHLORIDE 0.9% FLUSH: 3.0000 mL | Freq: Two times a day (BID) | INTRAVENOUS | Status: DC

## 2017-09-11 MED ORDER — SODIUM CHLORIDE 0.9% FLUSH: 3.0000 mL | INTRAVENOUS | Status: DC | PRN

## 2017-09-11 MED ORDER — ONDANSETRON HCL 4 MG PO TABS: 4.0000 mg | ORAL_TABLET | ORAL | Status: DC | PRN

## 2017-09-11 MED ORDER — PRENATAL MULTIVITAMIN CH
1.0000 | ORAL_TABLET | Freq: Every day | ORAL | Status: DC
  Administered 2017-09-12 – 2017-09-13 (×2): 1 via ORAL
  Filled 2017-09-11 (×2): qty 1

## 2017-09-11 MED ORDER — NAPROXEN 500 MG PO TABS
500.0000 mg | ORAL_TABLET | Freq: Two times a day (BID) | ORAL | Status: DC
  Administered 2017-09-12 – 2017-09-13 (×4): 500 mg via ORAL
  Filled 2017-09-11 (×5): qty 1

## 2017-09-11 MED ORDER — ZOLPIDEM TARTRATE 5 MG PO TABS: 5.0000 mg | ORAL_TABLET | Freq: Every evening | ORAL | Status: DC | PRN

## 2017-09-11 MED ORDER — IBUPROFEN 600 MG PO TABS: 600.0000 mg | ORAL_TABLET | Freq: Four times a day (QID) | ORAL | Status: DC

## 2017-09-11 MED ORDER — ACETAMINOPHEN 325 MG PO TABS: 650.0000 mg | ORAL_TABLET | ORAL | Status: DC | PRN

## 2017-09-11 MED ORDER — ONDANSETRON HCL 4 MG/2ML IJ SOLN
4.0000 mg | Freq: Four times a day (QID) | INTRAMUSCULAR | Status: DC | PRN
Start: 2017-09-11 — End: 2017-09-11

## 2017-09-11 MED ORDER — DIPHENHYDRAMINE HCL 25 MG PO CAPS: 25.0000 mg | ORAL_CAPSULE | Freq: Four times a day (QID) | ORAL | Status: DC | PRN

## 2017-09-11 MED ORDER — OXYTOCIN 40 UNITS IN LACTATED RINGERS INFUSION - SIMPLE MED
2.5000 [IU]/h | INTRAVENOUS | Status: DC
  Filled 2017-09-11: qty 1000

## 2017-09-11 MED ORDER — SIMETHICONE 80 MG PO CHEW: 80.0000 mg | CHEWABLE_TABLET | ORAL | Status: DC | PRN

## 2017-09-11 NOTE — H&P (Signed)
OB ADMISSION/ HISTORY & PHYSICAL:  Admission Date: 09/11/2017  3:32 PM  Admit Diagnosis: Pregnancy at 37.1 wks, normal labor, gestational hypertension  Shelby Norton is a 36 y.o. female presenting for active labor. Planned IOL on Sunday for gets HTN, patient had membrane sweep yesterday in office, 3-4/60/-1. Ctx started last night, mild through the night, increased in frequency through the day. + show, + FM, no LOF, no PEC S&S. Spouse present and supportive. Herbal labor prep with red raspberry leaf tea, evening primrose oil and senecio aureus.  Desires hydrotherapy in labor, low intervention  Prenatal History: G2P0101   EDC : 09/30/2017, by Other Basis  Prenatal care at [facility] since 10w4dweeks gestation, [facility].    Prenatal course complicated by : - gest HTN, normal PEC labs 11/15, BMZ course completed at 33 wks - bile salts done 10/19 and wnl, no further itching  - Hx severe PEC and IOL at 35 wks - Rh neg (Rhogam given  07/12/17) - hx migraines previously on topamax, increased frequency during pregnancy and managed w/ APAP and Benadryl - MTHFR + (unknown type) - hx rhabdomyolysis / autoimmune disorder  Followed by rheumatologist - difficulty metabolizing anesthesia gasses - pap neg, + HPV  At NOB, rpt pap postpartum - hx asthma, stable in pregnancy - GBS positive   Prenatal Labs: ABO, Rh: O (08/03 0000)  Antibody: Negative (08/03 0000) Rubella: Immune (08/03 0000)  RPR: Nonreactive (08/03 0000)  HBsAg: Negative (08/03 0000)  HIV: Non-reactive (08/03 0000)  GBS: Positive (11/12 0000)  3 hr Glucola : wnl NT and AFP1 wnl   Medical / Surgical History :  Past medical history:  Past Medical History:  Diagnosis Date  . Asthma   . Complication of anesthesia    difficult to awake  . Gestational hypertension 08/10/2017  . History of pre-eclampsia in prior pregnancy, currently pregnant   . HSV-1  (herpes simplex virus 1) infection    no outbreaks  . Migraines   . Mitral valve prolapse   . MTHFR mutation ([facility]      Past surgical history:  Past Surgical History:  Procedure Laterality Date  . ANTERIOR CRUCIATE LIGAMENT REPAIR Right 10/2015   R ACL alograph  . NASAL SEPTUM SURGERY    . TONSILLECTOMY       Family History:  Family History  Problem Relation Age of Onset  . Diabetes Mother   . Hypertension Mother   . Diabetes Father   . Hypertension Father   . Other Father        TIA  . Cancer Maternal Grandmother   . Stroke Paternal Grandfather      Social History:  reports that  has never smoked. she has never used smokeless tobacco. She reports that she drinks alcohol. She reports that she does not use drugs.   Allergies: Aspirin; Other; Codeine; Hydrocodone; and Betamethasone    Current Medications at time of admission:  Medications Prior to Admission  Medication Sig Dispense Refill Last Dose  . albuterol (PROVENTIL HFA;VENTOLIN HFA) 108 (90 Base) MCG/ACT inhaler Inhale 1-2 puffs into the lungs every 6 (six) hours as needed for wheezing or shortness of breath.   09/11/2017 at Unknown time  . famotidine (PEPCID AC) 10 MG chewable tablet Chew 20 mg by mouth 2 (two) times daily.   09/10/2017 at Unknown time  . fluconazole (DIFLUCAN) 150 MG tablet Take 150 mg by mouth every 3 (three)  days. Pt to take three tablets total   09/10/2017 at Unknown time  . MAGNESIUM MALATE PO Take 500 mg by mouth daily.    09/10/2017 at Unknown time  . Prenatal Vit-Fe Fumarate-FA (PRENATAL MULTIVITAMIN) TABS tablet Take 1 tablet by mouth daily at 12 noon.   09/10/2017 at Unknown time  . Probiotic Product (PROBIOTIC PO) Take 2 capsules by mouth daily.   09/10/2017 at Unknown time  . diphenhydrAMINE (BENADRYL) 25 MG tablet Take 50 mg by mouth daily as needed for allergies (headache).    09/02/2017 at Unknown time  . EPINEPHrine (EPIPEN 2-PAK) 0.3 mg/0.3 mL IJ SOAJ injection Inject 0.3 mg into  the skin daily as needed. For allergic reaction   emergency at Unknown time      Review of Systems: ROS As noted above   Physical Exam:  Dilation: 5 Effacement (%): 90 Station: -1 Exam by:: paul Vitals:   09/11/17 1550 09/11/17 1601  BP: 128/89   Pulse: 97   Weight:  92.1 kg (203 lb)  Height:  5' 5" (1.651 m)   General: AAO x 3, breathing w/ ctx and coping well Heart:RRR Lungs:CTAB Abdomen: gravid, NT, S=D, EFW 6.5-7 lbs Extremities: + 1 pedal edema Genitalia / VE: no lesions, + show, IBOW FHR: 130. Mod var, + accels, no decels TOCO: q 2-3 min, mod to palp  Labs:    Recent Labs    09/11/17 1610  WBC 20.9*  HGB 10.9*  HCT 33.1*  PLT 300   Hepatic Function Latest Ref Rng & Units 09/11/2017 08/23/2017 01/09/2014  Total Protein 6.5 - 8.1 g/dL 6.7 6.3(L) 5.4(L)  Albumin 3.5 - 5.0 g/dL 2.9(L) 2.9(L) 3.3(L)  AST 15 - 41 U/L 19 17 282(H)  ALT 14 - 54 U/L 14 11(L) 128(H)  Alk Phosphatase 38 - 126 U/L 140(H) 105 51  Total Bilirubin 0.3 - 1.2 mg/dL 0.7 0.5 0.7       Assessment:  36 y.o. G2P0101 at [redacted]w[redacted]d gest. HTN  1. Active 1st stage of labor 2. FHR category 1 3. GBS positive 4. Desires waterbirth 5. Breastfeeding - planned 6. BP normal, no neral s/s  Plan:  1. Admit to BS 2. Routine L&D orders, PEC labs 3. Analgesia/anesthesia PRN  4. Expectant management, hydrotherapy if BP continues in normal/mild range 5. Anticipate NSVB 6. GBS prophylaxis per protocol  Dr TRonita Hippsnotified of admission / plan of care   [place] MSN 09/11/2017, 4:29 PM

## 2017-09-12 LAB — CBC
HEMATOCRIT: 25.6 % — AB (ref 36.0–46.0)
Hemoglobin: 8.7 g/dL — ABNORMAL LOW (ref 12.0–15.0)
MCH: 29.2 pg (ref 26.0–34.0)
MCHC: 34 g/dL (ref 30.0–36.0)
MCV: 85.9 fL (ref 78.0–100.0)
Platelets: 272 10*3/uL (ref 150–400)
RBC: 2.98 MIL/uL — AB (ref 3.87–5.11)
RDW: 12.4 % (ref 11.5–15.5)
WBC: 21.4 10*3/uL — AB (ref 4.0–10.5)

## 2017-09-12 LAB — RPR: RPR Ser Ql: NONREACTIVE

## 2017-09-12 MED ORDER — POLYSACCHARIDE IRON COMPLEX 150 MG PO CAPS: 150.0000 mg | ORAL_CAPSULE | Freq: Every day | ORAL | Status: DC

## 2017-09-12 MED ORDER — MAGNESIUM OXIDE 400 (241.3 MG) MG PO TABS
400.0000 mg | ORAL_TABLET | Freq: Every day | ORAL | Status: DC
  Administered 2017-09-12: 400 mg via ORAL
  Filled 2017-09-12 (×3): qty 1

## 2017-09-12 NOTE — Progress Notes (Signed)
Patient indicated she wanted another flu shot; concerned that the shot she received in August2018 may not cover active strains that have been identified for this year's flu season.  Suugested she discuss with her doctor.  Jtwells, rn

## 2017-09-12 NOTE — Progress Notes (Addendum)
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        O:  A & O x 3, in no apparent distress              VS:  Vitals:   09/11/17 2220 09/11/17 2325 09/12/17 0300 09/12/17 0652  BP: 132/81 120/79 130/77 127/70  Pulse: 88 84 79 80  Resp: (!) 22 18 18 18   Temp: 98.2 F (36.8 C) 98.1 F (36.7 C) 98.2 F (36.8 C) 98.2 F (36.8 C)  TempSrc: Oral  Oral   SpO2: 98% 100% 99% 98%  Weight:      Height:        LABS:  Recent Labs    09/11/17 1610 09/12/17 0617  WBC 20.9* 21.4*  HGB 10.9* 8.7*  HCT 33.1* 25.6*  PLT 300 272    Blood type: --/--/O NEG (11/21 1610)/ baby Rh neg  Rubella: Immune (08/03 0000)   I&O: I/O last 3 completed shifts: In: -  Out: 400 [Blood:400]          No intake/output data recorded.  Lungs: Clear and unlabored  Heart: regular rate and rhythm / no murmurs  Abdomen: soft, non-tender, non-distended             Fundus: firm, non-tender, U-1  Perineum: repair intact, no edema  Lochia: small  Extremities: no edema, no calf pain or tenderness    A/P: PPD # 1 35 y.o., [id number]   Principal Problem:   SVD (spontaneous vaginal delivery) / Linden DolinWaterbirth 11/21 Active Problems:   Gestational hypertension, normotensive, no PEC s/s Maternal anemia Oral Fe and Mag ox started, add daily alfalfa 2000 mg  Doing well - stable status  Routine post partum orders  DC home tonight if peds will dc baby, otherwise will dc in AM    Neta Mendsaniela C Paul, MSN, CNM 09/12/2017, 11:56 AM

## 2017-09-12 NOTE — Lactation Note (Signed)
This note was copied from a baby's chart. Lactation Consultation Note Baby 6 hrs old. Mom states baby is cluster feeding, wanting to stay on breast constantly. Mom is alternating breast. Encouraged to assess before and after for transfer.  Mom has round heavy breast w/short shaft nipples. Lt. Nipple shorter than Rt/ mom BF in cross cradle. Mom had good chin tug. Baby had wide flange. Heard swallows. Noted space between breast isn't separated down to chest wall. A lot of tissue between breast, breast goes down in the middle, it's bouncy. Perhaps may not be separate and connects.?? Mom had increase in breast during pregnancy. Mom see's colostrum, mainly from Rt. Breast. LC hand expressed w/no glisten noted.  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 Mom wanted DEBP set up to pump for lactation induction. Mom shown how to use DEBP & how to disassemble, clean, & reassemble parts. Mom knows to pump q3h for 15-20 min. WH/LC brochure given w/resources, support groups and LC services. Encouraged to call for assistance or questions.   Patient Name: Shelby Norton RUEAV'WToday's Date: 09/12/2017 Reason for consult: Initial assessment;Early term 37-38.6wks   Maternal Data Has patient been taught Hand Expression?: Yes Does the patient have breastfeeding experience prior to this delivery?: No  Feeding Feeding Type: Breast Fed Length of feed: 45 min  LATCH Score Latch: Grasps breast easily, tongue down, lips flanged, rhythmical sucking.  Audible Swallowing: Spontaneous and intermittent  Type of Nipple: Everted at rest and after stimulation  Comfort (Breast/Nipple): Filling, red/small blisters or bruises, mild/mod discomfort  Hold (Positioning): No assistance needed to correctly position infant at breast.  LATCH Score: 9  Interventions Interventions: Breast feeding basics reviewed;Assisted with latch;Breast compression;Shells;Skin to skin;Adjust  position;Breast massage;Support pillows;Hand pump;Hand express;Position options;DEBP;Pre-pump if needed  Lactation Tools Discussed/Used Tools: Shells;Pump Shell Type: Inverted Breast pump type: Double-Electric Breast Pump WIC Program: No Pump Review: Setup, frequency, and cleaning;Milk Storage Initiated by:: Peri JeffersonL. Dahiana Kulak RN IBCLC Date initiated:: 09/12/17   Consult Status      [name] 09/12/2017, 1:53 AM

## 2017-09-13 ENCOUNTER — Encounter (HOSPITAL_COMMUNITY): Payer: Self-pay | Admitting: *Deleted

## 2017-09-13 MED ORDER — NAPROXEN 500 MG PO TABS: 500.0000 mg | ORAL_TABLET | Freq: Two times a day (BID) | ORAL | 0 refills | Status: DC

## 2017-09-13 MED ORDER — POLYSACCHARIDE IRON COMPLEX 150 MG PO CAPS: 150.0000 mg | ORAL_CAPSULE | Freq: Every day | ORAL | 0 refills | Status: DC

## 2017-09-13 NOTE — Lactation Note (Signed)
This note was copied from a baby's chart. Lactation Consultation Note  Patient Name: Shelby Norton SmallMegen Ellwood ZOXWR'UToday's Date: 09/13/2017 Reason for consult: Follow-up assessment   Baby 36 hours old.  2437w2d.  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 Left nipple has abrasion so mother is using #20NS. Mother is breastfeeding in addition to pumping w/ DEBP. Noted mid posterior tightness under tongue.  Encouraged tummy time when awake. Mother will call when baby wake for next feeding. Mom encouraged to feed baby 8-12 times/24 hours and with feeding cues.  Reviewed engorgement care and monitoring voids/stools.     Maternal Data    Feeding Feeding Type: Breast Fed Length of feed: 15 min  LATCH Score                   Interventions Interventions: Breast feeding basics reviewed  Lactation Tools Discussed/Used     Consult Status Consult Status: Follow-up Date: 09/14/17 Follow-up type: In-patient    Dahlia ByesBerkelhammer, Ruth Murray [facility] HospitalBoschen 09/13/2017, 8:25 AM

## 2017-09-13 NOTE — Progress Notes (Signed)
PPD # 1 SVD Information for the patient's newborn:  Gavin Poundbitz, Girl Derinda [[id number]][[id number]]  female breast feeding  Baby name: Sharol HarnessSutton  S:  Reports feeling sore but improving since naproxen             Tolerating po/ No nausea or vomiting             Bleeding is light             Pain controlled with naproxen (OTC)             Up ad lib / ambulatory / voiding without difficulties   O:  A & O x 3, in no apparent distress              VS:  Vitals:   09/12/17 0300 09/12/17 0652 09/12/17 1910 09/13/17 0406  BP: 130/77 127/70 137/84 125/84  Pulse: 79 80 81 76  Resp: 18 18 18 18   Temp: 98.2 F (36.8 C) 98.2 F (36.8 C) 98.2 F (36.8 C) 98.4 F (36.9 C)  TempSrc: Oral  Oral Oral  SpO2: 99% 98%    Weight:      Height:        LABS:  Recent Labs    09/11/17 1610 09/12/17 0617  WBC 20.9* 21.4*  HGB 10.9* 8.7*  HCT 33.1* 25.6*  PLT 300 272    Blood type: --/--/O NEG (11/21 1610)/ baby Rh neg  Rubella: Immune (08/03 0000)     Lungs: Clear and unlabored  Heart: regular rate and rhythm / no murmurs  Abdomen: soft, non-tender, non-distended             Fundus: firm, non-tender, U-1  Perineum: repair intact, no edema  Lochia: small  Extremities: no edema, no calf pain or tenderness    A/P: PPD # 2  36 y.o., [id number]  Principal Problem:   SVD (spontaneous vaginal delivery) / Linden DolinWaterbirth 11/21 Active Problems:   Gestational hypertension, normotensive, no PEC s/s Maternal anemia Oral Fe and Mag ox started, add daily alfalfa 2000 mg  Discharge home today. F/up CNM Paul in 6 wks and sooner if needed, warning s/s reviewed.     Robley FriesVaishali R Valley Ke, MD 09/13/2017, 9:15 AM

## 2017-09-13 NOTE — Discharge Summary (Signed)
Obstetric Discharge Summary Reason for Admission: onset of labor. Gestational hypertension Prenatal Procedures: NST and ultrasound Intrapartum Procedures: spontaneous vaginal delivery by uncomplicated waterbirth  Postpartum Procedures: none and Mother and baby both are Rh negative Complications-Operative and Postpartum: small labial/vaginal lac, repaired with 3-0 Vicryl.   Hemoglobin  Date Value Ref Range Status  09/12/2017 8.7 (L) 12.0 - 15.0 g/dL Final    Comment:    REPEATED TO VERIFY DELTA CHECK NOTED    HGB  Date Value Ref Range Status  04/08/2015 15.4 11.6 - 15.9 g/dL Final   HCT  Date Value Ref Range Status  09/12/2017 25.6 (L) 36.0 - 46.0 % Final  04/08/2015 43.9 34.8 - 46.6 % Final    Physical Exam:  General: alert and cooperative Lochia: appropriate Uterine Fundus: firm Incision: healing well DVT Evaluation: No evidence of DVT seen on physical exam.  Discharge Diagnoses: Term Pregnancy-delivered  Discharge Information: Date: 09/13/2017 Activity: pelvic rest Diet: routine Medications: PNV, Ibuprofen and Iron Condition: stable Instructions: refer to practice specific booklet Discharge to: home   Newborn Data: Live born female  Birth Weight: 6 lb 7 oz (2920 g) APGAR: 7, 9  Newborn Delivery   Birth date/time:  09/11/2017 19:42:00 Delivery type:  Vaginal, Spontaneous     Home with mother.  F/up with CNM Paul at 6 weeks.  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

## 2017-09-14 ENCOUNTER — Ambulatory Visit: Payer: Self-pay

## 2017-09-14 NOTE — Lactation Note (Signed)
This note was copied from a baby's chart. Lactation Consultation Note  Patient Name: Shelby Norton  baby is 4967 hours old and has been breast and formula.  As LC entered the room, mom pumping with  DEBP - Medela and with >40 ml EBM  Per mom full, but not engorged or sore. Sore nipple and engorgement prevention and  tx reviewed. Per mom will have a DEBP at home.  LC discussed the potential sluggishness from being jaundice and S/P photo tx.  Nutritive vs non- nutritive feeding patterns, and to feed STS until the baby can stay awake for a  Feeding. Watch for hanging out when latched.  LC recommended since she is using a NS to latch , post pumping is indicated.  Per mom was given a #20 and #24 NS and she has been using the #20 NS.  [facility] , and [facility]  [facility] Epic [facility].  Mother informed of post-discharge support and given phone number to the lactation department, including services for phone call assistance; out-patient appointments; and breastfeeding support group. List of other breastfeeding resources in the community given in the handout. Encouraged mother to call for problems or concerns related to breastfeeding.   Maternal Data    Feeding    LATCH Score                   Interventions    Lactation Tools Discussed/Used     Consult Status      Shelby Norton [name], 2:53 PM

## 2017-09-15 LAB — TYPE AND SCREEN
ABO/RH(D): O NEG
ANTIBODY SCREEN: POSITIVE
Unit division: 0
Unit division: 0

## 2017-09-15 LAB — BPAM RBC
Blood Product Expiration Date: 201812222359
Blood Product Expiration Date: 201812222359
UNIT TYPE AND RH: 9500
Unit Type and Rh: 9500

## 2017-12-22 ENCOUNTER — Inpatient Hospital Stay (HOSPITAL_COMMUNITY): Payer: 59

## 2017-12-22 ENCOUNTER — Encounter (HOSPITAL_COMMUNITY): Payer: Self-pay | Admitting: *Deleted

## 2017-12-22 ENCOUNTER — Inpatient Hospital Stay (HOSPITAL_COMMUNITY)
Admission: AD | Admit: 2017-12-22 | Discharge: 2017-12-22 | Disposition: A | Discharge status: Home / Self Care | Payer: 59 | Source: Ambulatory Visit | Attending: Obstetrics | Admitting: Obstetrics

## 2017-12-22 DIAGNOSIS — R102 Pelvic and perineal pain: Secondary | ICD-10-CM | POA: Diagnosis not present

## 2017-12-22 DIAGNOSIS — N200 Calculus of kidney: Secondary | ICD-10-CM | POA: Insufficient documentation

## 2017-12-22 DIAGNOSIS — R1032 Left lower quadrant pain: Secondary | ICD-10-CM

## 2017-12-22 DIAGNOSIS — R52 Pain, unspecified: Secondary | ICD-10-CM

## 2017-12-22 DIAGNOSIS — R109 Unspecified abdominal pain: Secondary | ICD-10-CM | POA: Diagnosis present

## 2017-12-22 LAB — CBC
HCT: 39.4 % (ref 36.0–46.0)
Hemoglobin: 13.6 g/dL (ref 12.0–15.0)
MCH: 27.5 pg (ref 26.0–34.0)
MCHC: 34.5 g/dL (ref 30.0–36.0)
MCV: 79.6 fL (ref 78.0–100.0)
PLATELETS: 365 10*3/uL (ref 150–400)
RBC: 4.95 MIL/uL (ref 3.87–5.11)
RDW: 13.5 % (ref 11.5–15.5)
WBC: 12.7 10*3/uL — ABNORMAL HIGH (ref 4.0–10.5)

## 2017-12-22 LAB — COMPREHENSIVE METABOLIC PANEL
ALBUMIN: 4.6 g/dL (ref 3.5–5.0)
ALT: 26 U/L (ref 14–54)
ANION GAP: 14 (ref 5–15)
AST: 31 U/L (ref 15–41)
Alkaline Phosphatase: 116 U/L (ref 38–126)
BUN: 27 mg/dL — ABNORMAL HIGH (ref 6–20)
CO2: 20 mmol/L — ABNORMAL LOW (ref 22–32)
Calcium: 9.7 mg/dL (ref 8.9–10.3)
Chloride: 101 mmol/L (ref 101–111)
Creatinine, Ser: 0.77 mg/dL (ref 0.44–1.00)
GFR calc Af Amer: >60 mL/min (ref >60)
GFR calc non Af Amer: >60 mL/min (ref >60)
GLUCOSE: 98 mg/dL (ref 65–99)
POTASSIUM: 4.6 mmol/L (ref 3.5–5.1)
Sodium: 135 mmol/L (ref 135–145)
Total Bilirubin: 1.4 mg/dL — ABNORMAL HIGH (ref 0.3–1.2)
Total Protein: 7.6 g/dL (ref 6.5–8.1)

## 2017-12-22 LAB — URINALYSIS, ROUTINE W REFLEX MICROSCOPIC
BILIRUBIN URINE: NEGATIVE
Bacteria, UA: NONE SEEN
GLUCOSE, UA: NEGATIVE mg/dL
KETONES UR: 20 mg/dL — AB
LEUKOCYTES UA: NEGATIVE
Nitrite: NEGATIVE
PH: 5 (ref 5.0–8.0)
Protein, ur: NEGATIVE mg/dL
SQUAMOUS EPITHELIAL / LPF: NONE SEEN
Specific Gravity, Urine: 1.014 (ref 1.005–1.030)

## 2017-12-22 LAB — POCT PREGNANCY, URINE: Preg Test, Ur: NEGATIVE

## 2017-12-22 LAB — LIPASE, BLOOD: LIPASE: 36 U/L (ref 11–51)

## 2017-12-22 LAB — AMYLASE: AMYLASE: 70 U/L (ref 28–100)

## 2017-12-22 MED ORDER — FENTANYL CITRATE (PF) 100 MCG/2ML IJ SOLN
50.0000 ug | Freq: Once | INTRAMUSCULAR | Status: DC
  Filled 2017-12-22: qty 2

## 2017-12-22 MED ORDER — IBUPROFEN 800 MG PO TABS: 800.0000 mg | ORAL_TABLET | Freq: Three times a day (TID) | ORAL | 0 refills | Status: DC | PRN

## 2017-12-22 MED ORDER — PROMETHAZINE HCL 12.5 MG PO TABS: 25.0000 mg | ORAL_TABLET | Freq: Four times a day (QID) | ORAL | 0 refills | Status: DC | PRN

## 2017-12-22 MED ORDER — FENTANYL CITRATE (PF) 100 MCG/2ML IJ SOLN: 50.0000 ug | Freq: Once | INTRAMUSCULAR | Status: DC

## 2017-12-22 MED ORDER — PROMETHAZINE HCL 25 MG/ML IJ SOLN: 25.0000 mg | Freq: Four times a day (QID) | INTRAMUSCULAR | Status: DC | PRN

## 2017-12-22 MED ORDER — PROMETHAZINE HCL 25 MG/ML IJ SOLN
25.0000 mg | Freq: Four times a day (QID) | INTRAMUSCULAR | Status: DC | PRN
  Filled 2017-12-22: qty 1

## 2017-12-22 MED ORDER — TAMSULOSIN HCL 0.4 MG PO CAPS: 0.4000 mg | ORAL_CAPSULE | Freq: Every day | ORAL | 0 refills | Status: DC

## 2017-12-22 MED ORDER — HYDROMORPHONE HCL 2 MG PO TABS: 4.0000 mg | ORAL_TABLET | ORAL | 0 refills | Status: DC | PRN

## 2017-12-22 MED ORDER — TAMSULOSIN HCL 0.4 MG PO CAPS
0.4000 mg | ORAL_CAPSULE | Freq: Every day | ORAL | Status: DC
  Administered 2017-12-22: 0.4 mg via ORAL
  Filled 2017-12-22 (×2): qty 1

## 2017-12-22 MED ORDER — LACTATED RINGERS IV SOLN
INTRAVENOUS | Status: DC
  Administered 2017-12-22: 17:00:00 via INTRAVENOUS

## 2017-12-22 MED ORDER — PROMETHAZINE HCL 25 MG/ML IJ SOLN
25.0000 mg | Freq: Four times a day (QID) | INTRAMUSCULAR | Status: DC | PRN
Start: 2017-12-22 — End: 2017-12-22
  Administered 2017-12-22: 25 mg via INTRAVENOUS

## 2017-12-22 MED ORDER — FENTANYL CITRATE (PF) 100 MCG/2ML IJ SOLN
50.0000 ug | Freq: Once | INTRAMUSCULAR | Status: AC
  Administered 2017-12-22: 50 ug via INTRAMUSCULAR
  Filled 2017-12-22: qty 2

## 2017-12-22 MED ORDER — HYDROMORPHONE HCL 2 MG PO TABS
4.0000 mg | ORAL_TABLET | ORAL | 0 refills | Status: DC | PRN
Start: 2017-12-22 — End: 2017-12-22

## 2017-12-22 MED ORDER — FENTANYL CITRATE (PF) 100 MCG/2ML IJ SOLN
50.0000 ug | Freq: Once | INTRAMUSCULAR | Status: AC
  Administered 2017-12-22: 50 ug via INTRAMUSCULAR

## 2017-12-22 NOTE — Discharge Instructions (Signed)

## 2017-12-22 NOTE — MAU Provider Note (Signed)
History     CSN: [id number]  Arrival date and time: 12/22/17 1514   None     Chief Complaint  Patient presents with  . 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 Pain refering to the lower L back. + nausea, no emesis. No fever, no VB, denies constipation. Voiding well today, has not been able to void while in MAU and needed to be cathed for UA/UCX sample.  S/P NSVB waterbirth 08/2017, hx gest HTN. Currently breastfeeding exclusively, no menses and no intercourse since delivery. + Hx of kidney stones, remote, states pain feels different this time.      Pertinent Gynecological History: Menses: no cycles since delivery 08/2017, breastfeeding exclusively    Past Medical History:  Diagnosis Date  . Asthma   . Complication of anesthesia    difficult to awake  . Gestational hypertension 08/10/2017  . History of pre-eclampsia in prior pregnancy, currently pregnant   . HSV-1 (herpes simplex virus 1) infection    no outbreaks  . Migraines   . Mitral valve prolapse   . MTHFR mutation [facility])     Past Surgical History:  Procedure Laterality Date  . ANTERIOR CRUCIATE LIGAMENT REPAIR Right 10/2015   R ACL alograph  . NASAL SEPTUM SURGERY    . TONSILLECTOMY      Family History  Problem Relation Age of Onset  . Diabetes Mother   . Hypertension Mother   . Diabetes Father   . Hypertension Father   . Other Father        TIA  . Cancer Maternal Grandmother   . Stroke Paternal Grandfather     Social History   Tobacco Use  . Smoking status: Never Smoker  . Smokeless tobacco: Never Used  Substance Use Topics  . Alcohol use: Yes    Comment: occ  . Drug use: No    Allergies:  Allergies  Allergen Reactions  . Aspirin Hives  . Other Anaphylaxis    hazelnuts  . Codeine Nausea And Vomiting and Swelling  . Hydrocodone Nausea And Vomiting and Swelling  .  Betamethasone Rash    On face and neck    Medications Prior to Admission  Medication Sig Dispense Refill Last Dose  . albuterol (PROVENTIL HFA;VENTOLIN HFA) 108 (90 Base) MCG/ACT inhaler Inhale 1-2 puffs into the lungs every 6 (six) hours as needed for wheezing or shortness of breath.   09/11/2017 at Unknown time  . EPINEPHrine (EPIPEN 2-PAK) 0.3 mg/0.3 mL IJ SOAJ injection Inject 0.3 mg into the skin daily as needed. For allergic reaction   emergency at Unknown time  . iron polysaccharides (NIFEREX) 150 MG capsule Take 1 capsule (150 mg total) by mouth daily. 90 capsule 0   . naproxen (NAPROSYN) 500 MG tablet Take 1 tablet (500 mg total) by mouth 2 (two) times daily with a meal. 30 tablet 0   . Prenatal Vit-Fe Fumarate-FA (PRENATAL MULTIVITAMIN) TABS tablet Take 1 tablet by mouth daily at 12 noon.   09/10/2017 at Unknown time  . Probiotic Product (PROBIOTIC PO) Take 2 capsules by mouth daily.   09/10/2017 at Unknown time    Review of Systems  Constitutional: Negative for fever.  Respiratory: Negative for shortness of breath.   Gastrointestinal: Negative for constipation.  Genitourinary: Positive for difficulty urinating, flank pain and pelvic pain.  Neurological: Negative for dizziness.   Physical Exam  Blood pressure (!) 146/100, pulse 98, temperature 97.6 F (36.4 C), temperature source Oral, unknown if currently breastfeeding.  Physical Exam  Constitutional: She is oriented to person, place, and time. She appears well-developed and well-nourished. No distress.  HENT:  Head: Normocephalic.  Cardiovascular: Normal heart sounds.  Respiratory: Effort normal and breath sounds normal.  GI: Soft. Bowel sounds are normal. There is no splenomegaly or hepatomegaly. There is tenderness in the left lower quadrant. There is no guarding and no CVA tenderness.  L side  Genitourinary: Rectum normal, vagina normal and uterus normal. Cervix exhibits no motion tenderness and no discharge. Right  adnexum displays no mass. Left adnexum displays no mass. No vaginal discharge found.  Musculoskeletal: Normal range of motion.  Neurological: She is alert and oriented to person, place, and time.  Skin: Skin is warm and dry.    MAU Course  Procedures Fentanyl 50 mcg IM given with minimal relief, followed by Fentanyl 50 mcg IV and Phenergan 25 mg IV with significant relief of pain.  [facility]    Component Value Date/Time   WBC 12.7 (H) 12/22/2017 1531   RBC 4.95 12/22/2017 1531   HGB 13.6 12/22/2017 1531   HGB 15.4 04/08/2015 1329   HCT 39.4 12/22/2017 1531   HCT 43.9 04/08/2015 1329   PLT 365 12/22/2017 1531   PLT 314 04/08/2015 1329   MCV 79.6 12/22/2017 1531   MCV 86 04/08/2015 1329   MCH 27.5 12/22/2017 1531   MCHC 34.5 12/22/2017 1531   RDW 13.5 12/22/2017 1531   RDW 11.8 04/08/2015 1329   LYMPHSABS 3.0 04/08/2015 1329   MONOABS 0.9 01/07/2014 2255   EOSABS 0.3 04/08/2015 1329   BASOSABS 0.0 04/08/2015 1329   Urine dipstick shows positive for WBC's, positive for RBC's and positive for ketones. Wet prep negative  CMP, amylase, lipase pending EXAM: TRANSABDOMINAL AND TRANSVAGINAL ULTRASOUND OF PELVIS  DOPPLER ULTRASOUND OF OVARIES  TECHNIQUE: Both transabdominal and transvaginal ultrasound examinations of the pelvis were performed. Transabdominal technique was performed for global imaging of the pelvis including uterus, ovaries, adnexal regions, and pelvic cul-de-sac.  It was necessary to proceed with endovaginal exam following the transabdominal exam to visualize the uterus and ovaries. Color and duplex Doppler ultrasound was utilized to evaluate blood flow to the ovaries.  COMPARISON:  None.  FINDINGS: Uterus  Measurements: 6.8 x 3.2 x 4.8 cm. No fibroids or other mass visualized.  Endometrium  Thickness: 1.9 mm.  No focal abnormality visualized.  Right ovary  Measurements: 3.1 x 2.4 x 2.4 cm. Normal appearance/no  adnexal mass.  Left ovary  Measurements: 2.8 x 1.4 x 2.7 cm. Normal appearance/no adnexal mass.  Pulsed Doppler evaluation of both ovaries demonstrates normal low-resistance arterial and venous waveforms.  Other findings  No abnormal free fluid.  IMPRESSION: Normal pelvic ultrasound. No evidence for ovarian torsion or other acute abnormality.   Electronically Signed   By: Rise MuBenjamin  McClintock M.D.   On: 12/22/2017 16:48 EXAM: RENAL / URINARY TRACT ULTRASOUND COMPLETE  COMPARISON:  None.  FINDINGS: Right Kidney:  Length: 10.7 cm. Normal renal cortical thickness and echogenicity. No hydronephrosis. There is a shadowing echogenic stone within the inferior pole.  Left Kidney:  Length: 11.5 cm. Normal renal cortical thickness and echogenicity. Mild hydronephrosis. There is a 5 mm stone within the interpolar region of the left kidney and a 5 mm stone within the inferior pole left kidney.  Bladder:  Poorly visualized and underdistended.  IMPRESSION: Mild  left hydronephrosis.  Bilateral renal stones.   Electronically Signed   By: Annia Belt M.D.   On: 12/22/2017 16:57  Assessment and Plan  Acute pelvic pain 2/2 nephrolithiasis  Bimanual exam benign, negative wet prep  Pain control initiated and will continue outpatient - patient with known allergic reaction to codeine, tolerated fentanyl today - will give ibuprofen 800 mg q 8 hrs and Flomax 0.4 mg daily to facilitate stone passage - promethazine 25 mg PO q 6 hrs PRN for nausea - Dilaudid 4 mg PO q 4-6 hrs PRN for breakthrough pain - strain all urine, supplies given - push fluids  F/U outpatient with urology consult ASAP, referral to [facility] in AM  Consult w/ Dr. Darien Ramus, CNM 12/22/2017, 4:53 PM

## 2017-12-22 NOTE — MAU Note (Signed)
Pt states she felt pain Dull and achy an hour and a half ago then a way of nausea came and pain became unbearable.  Only on the left side  Strong ache.  Denies VB.

## 2017-12-23 ENCOUNTER — Other Ambulatory Visit: Payer: Self-pay

## 2017-12-23 ENCOUNTER — Encounter (HOSPITAL_BASED_OUTPATIENT_CLINIC_OR_DEPARTMENT_OTHER): Payer: Self-pay | Admitting: *Deleted

## 2017-12-23 ENCOUNTER — Other Ambulatory Visit: Payer: Self-pay | Admitting: Urology

## 2017-12-23 NOTE — Progress Notes (Addendum)
SPOKE W/ PT VIA PHONE FOR PRE-OP INTERVIEW.  NPO AFTER MN. ARRIVE AT 0630.  CURRENT LAB RESULTS IN CHART AND Epic.  MAY TAKE PAIN/ NAUSEA RX IF NEEDED AM DOS W/ SIPS OF WATER.  LAST EKG IN CARE EVERYWHERE IN Epic DATED 02-27-2016.

## 2017-12-23 NOTE — H&P (Signed)
Urology Preoperative H&P   Chief Complaint: Left flank pain  History of Present Illness: Shelby Norton is a 37 y.o. female with a 48 hour history of worsening left lower quadrant/flank pain associated with hematuria and N/V.  She denies fever/chills.  Currently taking 800mg  of ibuprofen, tamsulosn, promethazine and Dilaudid 2mg  which alleviates her pain and nausea.  She is s/p NSVB in 08/2017 and reports progressively worsening back pain.  Remote history of stones when she was 21 and passed them.    RUS (12/22/17)- Bilateral renal stone and mild left hydronephrosis  CTSS (12/23/17)- shows 3 mm left UVJ and UPJ stones with mild hydronephrosis and and 7 mm right renal pelvic stone with no signs of obstruction on the right.  Past Medical History:  Diagnosis Date  . Asthma   . Complication of anesthesia    difficult to awake  . Gestational hypertension 08/10/2017  . History of pre-eclampsia in prior pregnancy, currently pregnant   . History of rhabdomyolysis 12/2013  . HSV-1 (herpes simplex virus 1) infection   . Migraines   . Mitral valve prolapse   . MTHFR mutation ([facility])   . Renal calculus, bilateral     Past Surgical History:  Procedure Laterality Date  . ANTERIOR CRUCIATE LIGAMENT REPAIR Right 10/2015   R ACL alograph  . NASAL SEPTUM SURGERY    . TONSILLECTOMY      Allergies:  Allergies  Allergen Reactions  . Aspirin Hives  . Other Anaphylaxis    hazelnuts  . Codeine Nausea And Vomiting and Swelling  . Hydrocodone Nausea And Vomiting and Swelling  . Betamethasone Rash    On face and neck    Family History  Problem Relation Age of Onset  . Diabetes Mother   . Hypertension Mother   . Diabetes Father   . Hypertension Father   . Other Father        TIA  . Cancer Maternal Grandmother   . Stroke Paternal Grandfather     Social History:  reports that  has never smoked. she has never used smokeless tobacco. She reports that she drinks alcohol. She reports that she does  not use drugs.  ROS: A complete review of systems was performed.  All systems are negative except for pertinent findings as noted.  Physical Exam:  Vital signs in last 24 hours: Pulse Rate:  [97] 97 (03/03 1838) BP: (124)/(75) 124/75 (03/03 1838) Constitutional:  Alert and oriented, No acute distress Cardiovascular: Regular rate and rhythm, No JVD Respiratory: Normal respiratory effort, Lungs clear bilaterally GI: Abdomen is soft, nontender, nondistended, no abdominal masses GU: No CVA tenderness Lymphatic: No lymphadenopathy Neurologic: Grossly intact, no focal deficits Psychiatric: Normal mood and affect  Laboratory Data:  Recent Labs    12/22/17 1531  WBC 12.7*  HGB 13.6  HCT 39.4  PLT 365    Recent Labs    12/22/17 1532  NA 135  K 4.6  CL 101  GLUCOSE 98  BUN 27*  CALCIUM 9.7  CREATININE 0.77     No results found for this or any previous visit (from the past 24 hour(s)). No results found for this or any previous visit (from the past 240 hour(s)).  Renal Function: Recent Labs    12/22/17 1532  CREATININE 0.77   CrCl cannot be calculated (Unknown ideal weight.).  Radiologic Imaging: US Renal  Result Date: 12/22/2017 CLINICAL DATA:  Patient with left lower quadrant pelvic pain. History of renal stones. EXAM: RENAL / URINARY TRACT  ULTRASOUND COMPLETE COMPARISON:  None. FINDINGS: Right Kidney: Length: 10.7 cm. Normal renal cortical thickness and echogenicity. No hydronephrosis. There is a shadowing echogenic stone within the inferior pole. Left Kidney: Length: 11.5 cm. Normal renal cortical thickness and echogenicity. Mild hydronephrosis. There is a 5 mm stone within the interpolar region of the left kidney and a 5 mm stone within the inferior pole left kidney. Bladder: Poorly visualized and underdistended. IMPRESSION: Mild left hydronephrosis. Bilateral renal stones. Electronically Signed   By: Annia Beltrew  Davis M.D.   On: 12/22/2017 16:57   [place] Pelvic Doppler  (torsion R/o Or Mass Arterial Flow)  Result Date: 12/22/2017 CLINICAL DATA:  Initial evaluation for acute left lower quadrant pain. EXAM: TRANSABDOMINAL AND TRANSVAGINAL ULTRASOUND OF PELVIS DOPPLER ULTRASOUND OF OVARIES TECHNIQUE: Both transabdominal and transvaginal ultrasound examinations of the pelvis were performed. Transabdominal technique was performed for global imaging of the pelvis including uterus, ovaries, adnexal regions, and pelvic cul-de-sac. It was necessary to proceed with endovaginal exam following the transabdominal exam to visualize the uterus and ovaries. Color and duplex Doppler ultrasound was utilized to evaluate blood flow to the ovaries. COMPARISON:  None. FINDINGS: Uterus Measurements: 6.8 x 3.2 x 4.8 cm. No fibroids or other mass visualized. Endometrium Thickness: 1.9 mm.  No focal abnormality visualized. Right ovary Measurements: 3.1 x 2.4 x 2.4 cm. Normal appearance/no adnexal mass. Left ovary Measurements: 2.8 x 1.4 x 2.7 cm. Normal appearance/no adnexal mass. Pulsed Doppler evaluation of both ovaries demonstrates normal low-resistance arterial and venous waveforms. Other findings No abnormal free fluid. IMPRESSION: Normal pelvic ultrasound. No evidence for ovarian torsion or other acute abnormality. Electronically Signed   By: Rise MuBenjamin  McClintock M.D.   On: 12/22/2017 17:41   [place] Pelvic Complete With Transvaginal  Result Date: 12/22/2017 CLINICAL DATA:  Initial evaluation for acute left lower quadrant pain. EXAM: TRANSABDOMINAL AND TRANSVAGINAL ULTRASOUND OF PELVIS DOPPLER ULTRASOUND OF OVARIES TECHNIQUE: Both transabdominal and transvaginal ultrasound examinations of the pelvis were performed. Transabdominal technique was performed for global imaging of the pelvis including uterus, ovaries, adnexal regions, and pelvic cul-de-sac. It was necessary to proceed with endovaginal exam following the transabdominal exam to visualize the uterus and ovaries. Color and duplex Doppler  ultrasound was utilized to evaluate blood flow to the ovaries. COMPARISON:  None. FINDINGS: Uterus Measurements: 6.8 x 3.2 x 4.8 cm. No fibroids or other mass visualized. Endometrium Thickness: 1.9 mm.  No focal abnormality visualized. Right ovary Measurements: 3.1 x 2.4 x 2.4 cm. Normal appearance/no adnexal mass. Left ovary Measurements: 2.8 x 1.4 x 2.7 cm. Normal appearance/no adnexal mass. Pulsed Doppler evaluation of both ovaries demonstrates normal low-resistance arterial and venous waveforms. Other findings No abnormal free fluid. IMPRESSION: Normal pelvic ultrasound. No evidence for ovarian torsion or other acute abnormality. Electronically Signed   By: Rise MuBenjamin  McClintock M.D.   On: 12/22/2017 16:48    I independently reviewed the above imaging studies.  Assessment and Plan Shelby Norton is a 37 y.o. female with bilateral kidney stones  -The risks, benefits and alternatives of cystoscopy with bilateral ureteroscopy, laser lithotripsy and bilateral ureteral stent placement was discussed the patient.  Risks included, but are not limited to: bleeding, urinary tract infection, ureteral injury/avulsion, ureteral stricture formation, retained stone fragments, the possibility that multiple surgeries may be required to treat the stone(s) and the inherent risks of general anesthesia.  The patient voices understanding and wishes to proceed.      Rhoderick Moodyhristopher Sherlon Nied, MD 12/23/2017, 4:23 PM  [facility] Pager: [phone]  [phone] 

## 2017-12-24 ENCOUNTER — Ambulatory Visit (HOSPITAL_BASED_OUTPATIENT_CLINIC_OR_DEPARTMENT_OTHER): Payer: 59 | Admitting: Anesthesiology

## 2017-12-24 ENCOUNTER — Ambulatory Visit (HOSPITAL_BASED_OUTPATIENT_CLINIC_OR_DEPARTMENT_OTHER)
Admission: RE | Admit: 2017-12-24 | Discharge: 2017-12-24 | Disposition: A | Discharge status: Home / Self Care | Payer: 59 | Source: Ambulatory Visit | Attending: Urology | Admitting: Urology

## 2017-12-24 ENCOUNTER — Encounter (HOSPITAL_BASED_OUTPATIENT_CLINIC_OR_DEPARTMENT_OTHER)
Admission: RE | Disposition: A | Discharge status: Home / Self Care | Payer: Self-pay | Source: Ambulatory Visit | Attending: Urology

## 2017-12-24 ENCOUNTER — Encounter (HOSPITAL_BASED_OUTPATIENT_CLINIC_OR_DEPARTMENT_OTHER): Payer: Self-pay

## 2017-12-24 DIAGNOSIS — Z91018 Allergy to other foods: Secondary | ICD-10-CM | POA: Diagnosis not present

## 2017-12-24 DIAGNOSIS — Z888 Allergy status to other drugs, medicaments and biological substances status: Secondary | ICD-10-CM | POA: Diagnosis not present

## 2017-12-24 DIAGNOSIS — N132 Hydronephrosis with renal and ureteral calculous obstruction: Secondary | ICD-10-CM | POA: Insufficient documentation

## 2017-12-24 DIAGNOSIS — Z885 Allergy status to narcotic agent status: Secondary | ICD-10-CM | POA: Diagnosis not present

## 2017-12-24 DIAGNOSIS — Z886 Allergy status to analgesic agent status: Secondary | ICD-10-CM | POA: Diagnosis not present

## 2017-12-24 DIAGNOSIS — I341 Nonrheumatic mitral (valve) prolapse: Secondary | ICD-10-CM | POA: Diagnosis not present

## 2017-12-24 DIAGNOSIS — I10 Essential (primary) hypertension: Secondary | ICD-10-CM | POA: Diagnosis not present

## 2017-12-24 DIAGNOSIS — J45909 Unspecified asthma, uncomplicated: Secondary | ICD-10-CM | POA: Insufficient documentation

## 2017-12-24 HISTORY — DX: Presence of spectacles and contact lenses: Z97.3

## 2017-12-24 HISTORY — DX: Personal history of other medical treatment: Z92.89

## 2017-12-24 HISTORY — DX: Personal history of urinary calculi: Z87.442

## 2017-12-24 HISTORY — DX: Unspecified asthma, uncomplicated: J45.909

## 2017-12-24 HISTORY — DX: Raynaud's syndrome without gangrene: I73.00

## 2017-12-24 HISTORY — DX: Personal history of other diseases of the musculoskeletal system and connective tissue: Z87.39

## 2017-12-24 HISTORY — DX: Personal history of other complications of pregnancy, childbirth and the puerperium: Z87.59

## 2017-12-24 HISTORY — PX: CYSTOSCOPY/URETEROSCOPY/HOLMIUM LASER/STENT PLACEMENT: SHX6546

## 2017-12-24 HISTORY — DX: Personal history of other diseases of the circulatory system: Z86.79

## 2017-12-24 HISTORY — PX: HOLMIUM LASER APPLICATION: SHX5852

## 2017-12-24 HISTORY — DX: Hematuria, unspecified: R31.9

## 2017-12-24 LAB — URINE CULTURE: CULTURE: NO GROWTH

## 2017-12-24 SURGERY — CYSTOSCOPY/URETEROSCOPY/HOLMIUM LASER/STENT PLACEMENT
Anesthesia: General | Site: Renal | Laterality: Bilateral

## 2017-12-24 MED ORDER — ACETAMINOPHEN 10 MG/ML IV SOLN
1000.0000 mg | Freq: Once | INTRAVENOUS | Status: DC | PRN
  Filled 2017-12-24: qty 100

## 2017-12-24 MED ORDER — PROMETHAZINE HCL 25 MG/ML IJ SOLN
INTRAMUSCULAR | Status: AC
  Filled 2017-12-24: qty 1

## 2017-12-24 MED ORDER — MIDAZOLAM HCL 2 MG/2ML IJ SOLN
INTRAMUSCULAR | Status: AC
  Filled 2017-12-24: qty 2

## 2017-12-24 MED ORDER — MIDAZOLAM HCL 5 MG/5ML IJ SOLN
INTRAMUSCULAR | Status: DC | PRN
  Administered 2017-12-24 (×2): 1 mg via INTRAVENOUS

## 2017-12-24 MED ORDER — CIPROFLOXACIN IN D5W 400 MG/200ML IV SOLN
INTRAVENOUS | Status: AC
Start: 2017-12-24 — End: 2017-12-24
  Filled 2017-12-24: qty 200

## 2017-12-24 MED ORDER — IOHEXOL 300 MG/ML  SOLN
INTRAMUSCULAR | Status: DC | PRN
  Administered 2017-12-24: 14 mL

## 2017-12-24 MED ORDER — ONDANSETRON HCL 4 MG/2ML IJ SOLN
INTRAMUSCULAR | Status: AC
  Filled 2017-12-24: qty 2

## 2017-12-24 MED ORDER — PROMETHAZINE HCL 25 MG/ML IJ SOLN
6.2500 mg | INTRAMUSCULAR | Status: DC | PRN
  Administered 2017-12-24: 6.25 mg via INTRAVENOUS
  Filled 2017-12-24: qty 1

## 2017-12-24 MED ORDER — FENTANYL CITRATE (PF) 100 MCG/2ML IJ SOLN
INTRAMUSCULAR | Status: AC
  Filled 2017-12-24: qty 2

## 2017-12-24 MED ORDER — PROPOFOL 10 MG/ML IV BOLUS
INTRAVENOUS | Status: AC
  Filled 2017-12-24: qty 20

## 2017-12-24 MED ORDER — FENTANYL CITRATE (PF) 100 MCG/2ML IJ SOLN
25.0000 ug | INTRAMUSCULAR | Status: DC | PRN
  Administered 2017-12-24 (×2): 50 ug via INTRAVENOUS
  Filled 2017-12-24: qty 1

## 2017-12-24 MED ORDER — KETOROLAC TROMETHAMINE 30 MG/ML IJ SOLN
30.0000 mg | Freq: Once | INTRAMUSCULAR | Status: AC
  Administered 2017-12-24: 30 mg via INTRAVENOUS
  Filled 2017-12-24: qty 1

## 2017-12-24 MED ORDER — KETOROLAC TROMETHAMINE 30 MG/ML IJ SOLN
INTRAMUSCULAR | Status: AC
  Filled 2017-12-24: qty 1

## 2017-12-24 MED ORDER — PROPOFOL 10 MG/ML IV BOLUS
INTRAVENOUS | Status: DC | PRN
  Administered 2017-12-24: 150 mg via INTRAVENOUS

## 2017-12-24 MED ORDER — FENTANYL CITRATE (PF) 100 MCG/2ML IJ SOLN
INTRAMUSCULAR | Status: DC | PRN
  Administered 2017-12-24 (×2): 50 ug via INTRAVENOUS

## 2017-12-24 MED ORDER — LIDOCAINE 2% (20 MG/ML) 5 ML SYRINGE
INTRAMUSCULAR | Status: DC | PRN
  Administered 2017-12-24: 60 mg via INTRAVENOUS
  Administered 2017-12-24: 40 mg via INTRAVENOUS

## 2017-12-24 MED ORDER — LIDOCAINE 2% (20 MG/ML) 5 ML SYRINGE
INTRAMUSCULAR | Status: AC
  Filled 2017-12-24: qty 5

## 2017-12-24 MED ORDER — LACTATED RINGERS IV SOLN
INTRAVENOUS | Status: DC
  Administered 2017-12-24 (×2): via INTRAVENOUS
  Filled 2017-12-24: qty 1000

## 2017-12-24 MED ORDER — DEXAMETHASONE SODIUM PHOSPHATE 10 MG/ML IJ SOLN
INTRAMUSCULAR | Status: AC
  Filled 2017-12-24: qty 1

## 2017-12-24 MED ORDER — SODIUM CHLORIDE 0.9 % IR SOLN
Status: DC | PRN
  Administered 2017-12-24: 4000 mL

## 2017-12-24 MED ORDER — ONDANSETRON HCL 4 MG/2ML IJ SOLN
INTRAMUSCULAR | Status: DC | PRN
  Administered 2017-12-24: 4 mg via INTRAVENOUS

## 2017-12-24 MED ORDER — DEXAMETHASONE SODIUM PHOSPHATE 4 MG/ML IJ SOLN
INTRAMUSCULAR | Status: DC | PRN
  Administered 2017-12-24: 10 mg via INTRAVENOUS

## 2017-12-24 MED ORDER — CIPROFLOXACIN IN D5W 400 MG/200ML IV SOLN
400.0000 mg | Freq: Once | INTRAVENOUS | Status: AC
  Administered 2017-12-24: 400 mg via INTRAVENOUS
  Filled 2017-12-24: qty 200

## 2017-12-24 SURGICAL SUPPLY — 32 items
APL SKNCLS STERI-STRIP NONHPOA (GAUZE/BANDAGES/DRESSINGS)
BAG DRAIN URO-CYSTO SKYTR STRL (DRAIN) ×2 IMPLANT
BAG DRN UROCATH (DRAIN) ×1
BASKET STONE 1.7 NGAGE (UROLOGICAL SUPPLIES) ×1 IMPLANT
BASKET ZERO TIP NITINOL 2.4FR (BASKET) ×2 IMPLANT
BENZOIN TINCTURE PRP APPL 2/3 (GAUZE/BANDAGES/DRESSINGS) IMPLANT
BSKT STON RTRVL ZERO TP 2.4FR (BASKET) ×1
CATH URET 5FR 28IN OPEN ENDED (CATHETERS) IMPLANT
CATH URET DUAL LUMEN 6-10FR 50 (CATHETERS) ×1 IMPLANT
CLOTH BEACON ORANGE TIMEOUT ST (SAFETY) ×2 IMPLANT
FIBER LASER FLEXIVA 365 (UROLOGICAL SUPPLIES) IMPLANT
FIBER LASER TRAC TIP (UROLOGICAL SUPPLIES) ×2 IMPLANT
GLOVE BIO SURGEON STRL SZ 6.5 (GLOVE) ×2 IMPLANT
GLOVE BIO SURGEON STRL SZ7.5 (GLOVE) ×2 IMPLANT
GLOVE BIOGEL PI IND STRL 6.5 (GLOVE) IMPLANT
GLOVE BIOGEL PI INDICATOR 6.5 (GLOVE) ×2
GOWN STRL REUS W/TWL LRG LVL3 (GOWN DISPOSABLE) ×1 IMPLANT
GOWN STRL REUS W/TWL XL LVL3 (GOWN DISPOSABLE) IMPLANT
GUIDEWIRE ANG ZIPWIRE 038X150 (WIRE) ×2 IMPLANT
GUIDEWIRE STR DUAL SENSOR (WIRE) IMPLANT
INFUSOR MANOMETER BAG 3000ML (MISCELLANEOUS) ×2 IMPLANT
IV NS 1000ML (IV SOLUTION)
IV NS 1000ML BAXH (IV SOLUTION) IMPLANT
IV NS IRRIG 3000ML ARTHROMATIC (IV SOLUTION) ×2 IMPLANT
KIT TURNOVER CYSTO (KITS) ×2 IMPLANT
MANIFOLD NEPTUNE II (INSTRUMENTS) ×2 IMPLANT
NS IRRIG 500ML POUR BTL (IV SOLUTION) ×4 IMPLANT
PACK CYSTO (CUSTOM PROCEDURE TRAY) ×2 IMPLANT
STENT URET 6FRX24 CONTOUR (STENTS) ×2 IMPLANT
STRIP CLOSURE SKIN 1/2X4 (GAUZE/BANDAGES/DRESSINGS) IMPLANT
SYRINGE 10CC LL (SYRINGE) ×2 IMPLANT
TUBE CONNECTING 12X1/4 (SUCTIONS) IMPLANT

## 2017-12-24 NOTE — Op Note (Signed)
Operative Note  Preoperative diagnosis:  1.  3 mm left UVJ and UPJ stones 2.  9 mm right renal stone 3.  Mild left hydronephrosis  Postoperative diagnosis: 1.  3 mm left UVJ and UPJ stones 2.  9 mm right renal stone 3.  Mild left hydronephrosis  Procedure(s): 1.  Cystoscopy 2.  Bilateral retrograde pyelograms with intraoperative interpretation of fluoroscopic imaging 3.  Bilateral ureteroscopy 4.  Bilateral laser lithotripsy 5.  Bilateral JJ stent placement with tether on both stents  Surgeon: Rhoderick Moodyhristopher Winter, MD  Assistants: None  Anesthesia: General LMA  Complications: None  EBL: Less than 5 mL  Specimens: 1.  Left UVJ stone  Drains/Catheters: 1.  Bilateral 6 French by 24 cm JJ stents with tether on both  Intraoperative findings:   1.  Solitary left collecting system with no filling defects or dilation involving the left ureter or left renal pelvis seen on retrograde pyelogram 2.  Solitary right collecting system with no filling defects or dilation involving the right ureter or right renal pelvis seen on retrograde pyelogram 3.  Obstructing 3 mm left UVJ and UPJ stones 4.  Nonobstructing 9 mm right renal stone  Indication:  Shelby Norton is a 37 y.o. female with a 48 hour history of worsening left lower quadrant/flank pain associated with hematuria and N/V.  She denies fever/chills.  Currently taking 800mg  of ibuprofen, tamsulosn, promethazine and Dilaudid 2mg  which alleviates her pain and nausea.  She is s/p NSVB in 08/2017 and reports progressively worsening back pain.  Remote history of stones when she was 21 and passed them.    RUS (12/22/17)- Bilateral renal stone and mild left hydronephrosis  CTSS (12/23/17)- shows 3 mm left UVJ and UPJ stones with mild hydronephrosis and and 7 mm right renal pelvic stone with no signs of obstruction on the right.  Description of procedure:  After informed consent was obtained, the patient was brought to the operating room  and general LMA anesthesia was administered. The patient was then placed in the dorsolithotomy position and prepped and draped in usual sterile fashion. A timeout was performed. A 21 French rigid cystoscope was then inserted into the urethral meatus and advanced into the bladder under direct vision. A complete bladder survey revealed no intravesical pathology.  A 5 French open-ended catheter was then inserted into the left ureteral orifice and a retrograde pyelogram was obtained with the findings listed above.  A Glidewire was then advanced through the lumen of the ureteral catheter and up to the left renal pelvis, under fluoroscopic guidance.  A semirigid ureteroscope was then advanced alongside the Glidewire until her obstructing left distal stone was identified.  A 200 m holmium laser was then used to fracture the stone into numerous smaller pieces and a nitinol tip was basket was then used to extract all stone fragments from the lumen of the left ureter.  A flexible ureteroscope was then advanced over the wire and into position within the proximal left ureter where her obstructing stone was identified.  The stone quickly retropulsed into the  left mid calyx.  An engage basket was then used to retrieve the stone and remove it under direct vision.  The Glidewire was then replaced within the left collecting system.  A 6 [place] JJ stent was then advanced over the wire and into good position within the left collecting system, confirming placement via fluoroscopy.  The tether of the stent was left intact.  A right retrograde pyelogram was then obtained  in a similar fashion, with the findings listed above.  A Glidewire was then advanced through the lumen of the ureteral catheter and up to the right renal pelvis, under fluoroscopic guidance.  A flexible ureteroscope was then advanced over the wire and into position within the right renal pelvis.  A right mid pole calyx stone was identified.  A 200 m holmium laser  was then used to fracture the stone into a numerous submillimeter fragments.  The wire was placed through the lumen of the flexible scope, which was then removed under direct vision.  A 6 [place] JJ stent was then placed over the wire and into good position within the right collecting system,  confirming placement via fluoroscopy.  Detail of the stent was left intact.  Both others were then tucked within the vaginal introitus.  The patient's bladder was then drained.  She tolerated the procedure well and was transferred to the postanesthesia unit in stable condition.  Plan: The patient has been instructed to remove both of her stents at 6 AM on 12/27/2017 and follow-up in 6 weeks with a bilateral renal ultrasound

## 2017-12-24 NOTE — Anesthesia Procedure Notes (Signed)
Procedure Name: LMA Insertion Date/Time: 12/24/2017 8:46 AM Performed by: Eilene Ghaziose, George, MD Pre-anesthesia Checklist: Patient identified, Emergency Drugs available, Suction available and Patient being monitored Patient Re-evaluated:Patient Re-evaluated prior to induction Oxygen Delivery Method: Circle system utilized Preoxygenation: Pre-oxygenation with 100% oxygen Induction Type: IV induction Ventilation: Mask ventilation without difficulty LMA: LMA inserted LMA Size: 4.0 Number of attempts: 1 Airway Equipment and Method: Bite block Placement Confirmation: positive ETCO2 Tube secured with: Tape Dental Injury: Teeth and Oropharynx as per pre-operative assessment

## 2017-12-24 NOTE — Anesthesia Preprocedure Evaluation (Signed)
Anesthesia Evaluation  Patient identified by MRN, date of birth, ID band Patient awake    Reviewed: Allergy & Precautions, NPO status , Patient's Chart, lab work & pertinent test results  Airway Mallampati: II  TM Distance: >3 FB Neck ROM: Full    Dental no notable dental hx.    Pulmonary neg pulmonary ROS,    Pulmonary exam normal breath sounds clear to auscultation       Cardiovascular hypertension, Normal cardiovascular exam Rhythm:Regular Rate:Normal     Neuro/Psych negative neurological ROS  negative psych ROS   GI/Hepatic negative GI ROS, Neg liver ROS,   Endo/Other  negative endocrine ROS  Renal/GU negative Renal ROS  negative genitourinary   Musculoskeletal negative musculoskeletal ROS (+)   Abdominal   Peds negative pediatric ROS (+)  Hematology negative hematology ROS (+)   Anesthesia Other Findings   Reproductive/Obstetrics negative OB ROS                             Anesthesia Physical Anesthesia Plan  ASA: II  Anesthesia Plan: General   Post-op Pain Management:    Induction: Intravenous  PONV Risk Score and Plan: 3 and Ondansetron, Dexamethasone, Midazolam and Treatment may vary due to age or medical condition  Airway Management Planned: LMA  Additional Equipment:   Intra-op Plan:   Post-operative Plan: Extubation in OR  Informed Consent: I have reviewed the patients History and Physical, chart, labs and discussed the procedure including the risks, benefits and alternatives for the proposed anesthesia with the patient or authorized representative who has indicated his/her understanding and acceptance.     Dental advisory given  Plan Discussed with: CRNA and Surgeon  Anesthesia Plan Comments:         Anesthesia Quick Evaluation  

## 2017-12-24 NOTE — Transfer of Care (Signed)
Immediate Anesthesia Transfer of Care Note  Patient: Shelby Norton  Procedure(s) Performed: CYSTOSCOPY/RETROGRADE/URETEROSCOPY/HOLMIUM LASER LITHOTRIPSY, STONE BASKETRY /STENT PLACEMENT (Bilateral Renal) HOLMIUM LASER APPLICATION (Bilateral Renal)  Patient Location: PACU  Anesthesia Type:General  Level of Consciousness: sedated  Airway & Oxygen Therapy: Patient Spontanous Breathing and Patient connected to nasal cannula oxygen  Post-op Assessment: Report given to RN  Post vital signs: Reviewed  Last Vitals: 112/74, 94, 12, 93% Vitals:   12/24/17 0659  BP: 119/80  Pulse: 89  Resp: 18  Temp: 36.9 C  SpO2: 97%    Last Pain:  Vitals:   12/24/17 0719  TempSrc:   PainSc: 4       Patients Stated Pain Goal: 3 (12/24/17 0719)  Complications: No apparent anesthesia complications

## 2017-12-24 NOTE — Progress Notes (Signed)
Late entry- unable to add addendum to CNM note.  Pt seen with CNM 3/3 at 5 pm due to acute pelvic/ back/ flank pain. Sudden onset. NO response to initial pain meds. No vaginal/ d/c complaints.   Exam per CNM  Wet prep: good lactobacilli, no WBC, no clue, no trich, no yeast, nl BV. Normal wet prep.  A/P: exam, history, u/s and pain all c/w renal stone. Supportive management and pain control and urgent f/u with urology tomorrow.   Lendon ColonelKelly A Tamarius Rosenfield 12/24/2017 9:37 PM

## 2017-12-24 NOTE — Discharge Instructions (Signed)

## 2017-12-24 NOTE — Anesthesia Postprocedure Evaluation (Signed)
Anesthesia Post Note  Patient: Shelby Norton  Procedure(s) Performed: CYSTOSCOPY/RETROGRADE/URETEROSCOPY/HOLMIUM LASER LITHOTRIPSY, STONE BASKETRY /STENT PLACEMENT (Bilateral Renal) HOLMIUM LASER APPLICATION (Bilateral Renal)     Patient location during evaluation: PACU Anesthesia Type: General Level of consciousness: awake and alert Pain management: pain level controlled Vital Signs Assessment: post-procedure vital signs reviewed and stable Respiratory status: spontaneous breathing, nonlabored ventilation, respiratory function stable and patient connected to nasal cannula oxygen Cardiovascular status: blood pressure returned to baseline and stable Postop Assessment: no apparent nausea or vomiting Anesthetic complications: no    Last Vitals:  Vitals:   12/24/17 1115 12/24/17 1121  BP: 123/83   Pulse: 90   Resp: 20   Temp:    SpO2: 90% 93%    Last Pain:  Vitals:   12/24/17 1115  TempSrc:   PainSc: 6                  [name] S

## 2017-12-24 NOTE — Interval H&P Note (Signed)
History and Physical Interval Note:  12/24/2017 7:32 AM  Shelby Norton  has presented today for surgery, with the diagnosis of BILATERAL KIDNEY STONES  The various methods of treatment have been discussed with the patient and family. After consideration of risks, benefits and other options for treatment, the patient has consented to  Procedure(s) with comments: CYSTOSCOPY/RETROGRADE/URETEROSCOPY/HOLMIUM LASER/STENT PLACEMENT (Bilateral) - ONLY NEEDS 45 MIN TOTAL FOR ALL PROCEDURES as a surgical intervention .  The patient's history has been reviewed, patient examined, no change in status, stable for surgery.  I have reviewed the patient's chart and labs.  Questions were answered to the patient's satisfaction.     Dorian Furnacehristopher Aaron Winter

## 2017-12-25 ENCOUNTER — Encounter (HOSPITAL_BASED_OUTPATIENT_CLINIC_OR_DEPARTMENT_OTHER): Payer: Self-pay | Admitting: Urology

## 2017-12-26 ENCOUNTER — Encounter (HOSPITAL_COMMUNITY): Payer: Self-pay | Admitting: Emergency Medicine

## 2017-12-26 ENCOUNTER — Emergency Department (HOSPITAL_COMMUNITY)
Admission: EM | Admit: 2017-12-26 | Discharge: 2017-12-26 | Disposition: A | Discharge status: Home / Self Care | Payer: 59 | Attending: Emergency Medicine | Admitting: Emergency Medicine

## 2017-12-26 ENCOUNTER — Emergency Department (HOSPITAL_COMMUNITY): Payer: 59

## 2017-12-26 DIAGNOSIS — N12 Tubulo-interstitial nephritis, not specified as acute or chronic: Secondary | ICD-10-CM | POA: Diagnosis not present

## 2017-12-26 DIAGNOSIS — R509 Fever, unspecified: Secondary | ICD-10-CM | POA: Diagnosis present

## 2017-12-26 DIAGNOSIS — J45909 Unspecified asthma, uncomplicated: Secondary | ICD-10-CM | POA: Insufficient documentation

## 2017-12-26 DIAGNOSIS — Z79899 Other long term (current) drug therapy: Secondary | ICD-10-CM | POA: Insufficient documentation

## 2017-12-26 LAB — CBC WITH DIFFERENTIAL/PLATELET
BASOS ABS: 0 10*3/uL (ref 0.0–0.1)
Basophils Relative: 0 %
EOS ABS: 0.6 10*3/uL (ref 0.0–0.7)
EOS PCT: 6 %
HCT: 37.6 % (ref 36.0–46.0)
Hemoglobin: 12.1 g/dL (ref 12.0–15.0)
Lymphocytes Relative: 14 %
Lymphs Abs: 1.4 10*3/uL (ref 0.7–4.0)
MCH: 27.3 pg (ref 26.0–34.0)
MCHC: 32.2 g/dL (ref 30.0–36.0)
MCV: 84.7 fL (ref 78.0–100.0)
Monocytes Absolute: 0.7 10*3/uL (ref 0.1–1.0)
Monocytes Relative: 7 %
Neutro Abs: 7.4 10*3/uL (ref 1.7–7.7)
Neutrophils Relative %: 73 %
PLATELETS: 342 10*3/uL (ref 150–400)
RBC: 4.44 MIL/uL (ref 3.87–5.11)
RDW: 13.9 % (ref 11.5–15.5)
WBC: 10.1 10*3/uL (ref 4.0–10.5)

## 2017-12-26 LAB — URINALYSIS, ROUTINE W REFLEX MICROSCOPIC
BACTERIA UA: NONE SEEN
BILIRUBIN URINE: NEGATIVE
Glucose, UA: NEGATIVE mg/dL
Ketones, ur: NEGATIVE mg/dL
NITRITE: POSITIVE — AB
Protein, ur: 100 mg/dL — AB
SPECIFIC GRAVITY, URINE: 1.01 (ref 1.005–1.030)
pH: 7 (ref 5.0–8.0)

## 2017-12-26 LAB — I-STAT BETA HCG BLOOD, ED (MC, WL, AP ONLY)

## 2017-12-26 LAB — COMPREHENSIVE METABOLIC PANEL
ALK PHOS: 124 U/L (ref 38–126)
ALT: 59 U/L — AB (ref 14–54)
AST: 74 U/L — AB (ref 15–41)
Albumin: 3.7 g/dL (ref 3.5–5.0)
Anion gap: 10 (ref 5–15)
BILIRUBIN TOTAL: 0.9 mg/dL (ref 0.3–1.2)
BUN: 7 mg/dL (ref 6–20)
CALCIUM: 8.5 mg/dL — AB (ref 8.9–10.3)
CHLORIDE: 104 mmol/L (ref 101–111)
CO2: 25 mmol/L (ref 22–32)
CREATININE: 0.82 mg/dL (ref 0.44–1.00)
Glucose, Bld: 99 mg/dL (ref 65–99)
Potassium: 3.3 mmol/L — ABNORMAL LOW (ref 3.5–5.1)
Sodium: 139 mmol/L (ref 135–145)
TOTAL PROTEIN: 6.6 g/dL (ref 6.5–8.1)

## 2017-12-26 LAB — PROTIME-INR
INR: 0.95
Prothrombin Time: 12.6 seconds (ref 11.4–15.2)

## 2017-12-26 LAB — I-STAT CG4 LACTIC ACID, ED: LACTIC ACID, VENOUS: 1.32 mmol/L (ref 0.5–1.9)

## 2017-12-26 MED ORDER — SODIUM CHLORIDE 0.9 % IV BOLUS (SEPSIS)
1000.0000 mL | Freq: Once | INTRAVENOUS | Status: AC
  Administered 2017-12-26: 1000 mL via INTRAVENOUS

## 2017-12-26 MED ORDER — CEPHALEXIN 500 MG PO CAPS: 500.0000 mg | ORAL_CAPSULE | Freq: Four times a day (QID) | ORAL | 0 refills | Status: DC

## 2017-12-26 MED ORDER — HYDROMORPHONE HCL 1 MG/ML IJ SOLN
1.0000 mg | Freq: Once | INTRAMUSCULAR | Status: AC
  Administered 2017-12-26: 1 mg via INTRAVENOUS
  Filled 2017-12-26: qty 1

## 2017-12-26 MED ORDER — SODIUM CHLORIDE 0.9 % IV SOLN
1.0000 g | Freq: Once | INTRAVENOUS | Status: AC
  Administered 2017-12-26: 1 g via INTRAVENOUS
  Filled 2017-12-26: qty 10

## 2017-12-26 MED ORDER — SODIUM CHLORIDE 0.9 % IV SOLN
1000.0000 mL | INTRAVENOUS | Status: DC
  Administered 2017-12-26: 1000 mL via INTRAVENOUS

## 2017-12-26 NOTE — ED Triage Notes (Signed)
Patient reports that this afternoon woke up with chills, fevers. Had surgery on bilat kidneys on 3/5.

## 2017-12-26 NOTE — ED Provider Notes (Signed)
[facility]-EMERGENCY DEPT Provider Note   CSN: [id number] Arrival date & time: 12/26/17  1732     History   Chief Complaint Chief Complaint  Patient presents with  . Fever    HPI Shelby Norton is a 37 y.o. female.  HPI The patient presented to the emergency room for evaluation of chills and fever.  Patient has a history of recent lithotripsy procedure on March 5.  Patient states she seemed to be recovering okay.  Today she felt a little bit more achy and started to feel chilled.  She ended up taking her temperature and had a fever at home.  She called the surgeon on call and was instructed to come to the emergency room.  She has some continued pain in her back but no abdominal pain.  No vomiting or diarrhea.  No cough sore throat chest pain or shortness of breath. Past Medical History:  Diagnosis Date  . Complication of anesthesia    per pt hard to wake  . Hematuria   . History of exercise stress test    ETT 03-09-2016 in careeverywhere in epic-- result negative  . History of gestational hypertension 07/2017   preeclamptic  . History of kidney stones   . History of pre-eclampsia    10/ 2018;  2010  . History of rhabdomyolysis 12/2013  . History of supraventricular tachycardia    per pt hx one episode , last halter montior  05/ 2017 note svt ectopy x4 in careeverywhere from [facility] in [place]  . HSV-1 (herpes simplex virus 1) infection   . Migraines   . Mild asthma    prn inhaler  . Mitral valve prolapse    per pt dx mvp yrs ago and has been followed cardiologist---  last office visit 05/ 2017 [facility] in [place].  in careeverywhere  . MTHFR mutation (HCC)   . Raynauds syndrome    takes procardia  . Wears contact lenses     Patient Active Problem List   Diagnosis Date Noted  . Pelvic pain 12/22/2017  . SVD (spontaneous vaginal delivery) / [name] 11/21 09/12/2017  . Gestational hypertension 08/10/2017  . Rhabdomyolysis  01/08/2014    Past Surgical History:  Procedure Laterality Date  . ANTERIOR CRUCIATE LIGAMENT REPAIR Right 10/2015  . CYSTOSCOPY/URETEROSCOPY/HOLMIUM LASER/STENT PLACEMENT Bilateral 12/24/2017   Procedure: CYSTOSCOPY/RETROGRADE/URETEROSCOPY/HOLMIUM LASER LITHOTRIPSY, STONE BASKETRY /STENT PLACEMENT;  Surgeon: Rene Paci, MD;  Location: [facility];  Service: Urology;  Laterality: Bilateral;  . HOLMIUM LASER APPLICATION Bilateral 12/24/2017   Procedure: HOLMIUM LASER APPLICATION;  Surgeon: Rene Paci, MD;  Location: [facility];  Service: Urology;  Laterality: Bilateral;  . NASAL SEPTUM SURGERY  02/2013  . TONSILLECTOMY  1991    OB History    Gravida Para Term Preterm AB Living   2 1 0 1 0 1   SAB TAB Ectopic Multiple Live Births   0 0 0 0 1       Home Medications    Prior to Admission medications   Medication Sig Start Date End Date Taking? Authorizing Provider  albuterol (PROVENTIL HFA;VENTOLIN HFA) 108 (90 Base) MCG/ACT inhaler Inhale 1-2 puffs into the lungs every 6 (six) hours as needed for wheezing or shortness of breath.   Yes [provider]  ALFALFA PO Take 1,000 mg by mouth daily.    Yes [provider]  cetirizine (ZYRTEC) 10 MG tablet Take 10 mg by mouth daily.   Yes  [provider]  EPINEPHrine (EPIPEN 2-PAK) 0.3 mg/0.3 mL IJ SOAJ injection Inject 0.3 mg into the skin daily as needed. For allergic reaction 06/22/14  Yes [provider]  famotidine (PEPCID) 20 MG tablet Take 20 mg by mouth as needed.   Yes [provider]  HYDROmorphone (DILAUDID) 2 MG tablet Take 2 tablets (4 mg total) by mouth every 4 (four) hours as needed for up to 365 doses for severe pain. 12/22/17  Yes Neta Mends, CNM  ibuprofen (ADVIL,MOTRIN) 800 MG tablet Take 1 tablet (800 mg total) by mouth every 8 (eight) hours as needed. 12/22/17  Yes Neta Mends, CNM  MAGNESIUM MALATE PO Take 1,000 mg by  mouth daily.   Yes [provider]  NIFEdipine (PROCARDIA-XL/ADALAT-CC/NIFEDICAL-XL) 30 MG 24 hr tablet Take 30 mg by mouth every evening.   Yes [provider]  Prenatal Vit-Fe Fumarate-FA (PRENATAL MULTIVITAMIN) TABS tablet Take 1 tablet by mouth daily at 12 noon.   Yes [provider]  Probiotic Product (PROBIOTIC PO) Take 2 capsules by mouth daily.   Yes [provider]  promethazine (PHENERGAN) 12.5 MG tablet Take 2 tablets (25 mg total) by mouth every 6 (six) hours as needed for nausea or vomiting. 12/22/17  Yes Neta Mends, CNM  SUMAtriptan (IMITREX) 100 MG tablet Take 100 mg by mouth every 2 (two) hours as needed for migraine. May repeat in 2 hours if headache persists or recurs.   Yes [provider]  tamsulosin (FLOMAX) 0.4 MG CAPS capsule Take 1 capsule (0.4 mg total) by mouth daily after supper. Patient taking differently: Take 0.4 mg by mouth every evening.  12/22/17  Yes Neta Mends, CNM  cephALEXin (KEFLEX) 500 MG capsule Take 1 capsule (500 mg total) by mouth 4 (four) times daily. 12/26/17   Linwood Dibbles, MD    Family History Family History  Problem Relation Age of Onset  . Diabetes Mother   . Hypertension Mother   . Diabetes Father   . Hypertension Father   . Other Father        TIA  . Cancer Maternal Grandmother   . Stroke Paternal Grandfather     Social History Social History   Tobacco Use  . Smoking status: Never Smoker  . Smokeless tobacco: Never Used  Substance Use Topics  . Alcohol use: Yes    Comment: occ  . Drug use: No     Allergies   Aspirin; Other; Codeine; Hydrocodone; and Betamethasone   Review of Systems Review of Systems  All other systems reviewed and are negative.    Physical Exam Updated Vital Signs BP 126/81   Pulse (!) 107   Temp 100 F (37.8 C) (Axillary)   Resp (!) 28   Wt 78.9 kg (174 lb)   SpO2 95%   BMI 28.96 kg/m   Physical Exam  Constitutional: She appears  well-developed and well-nourished. No distress.  HENT:  Head: Normocephalic and atraumatic.  Right Ear: External ear normal.  Left Ear: External ear normal.  Eyes: Conjunctivae are normal. Right eye exhibits no discharge. Left eye exhibits no discharge. No scleral icterus.  Neck: Neck supple. No tracheal deviation present.  Cardiovascular: Regular rhythm and intact distal pulses. Tachycardia present.  Pulmonary/Chest: Effort normal and breath sounds normal. No stridor. No respiratory distress. She has no wheezes. She has no rales.  Abdominal: Soft. Bowel sounds are normal. She exhibits no distension. There is no tenderness. There is no rebound and no guarding.  Musculoskeletal: She exhibits no edema or tenderness.  Neurological: She is alert. She has normal strength. No cranial nerve deficit (no facial droop, extraocular movements intact, no slurred speech) or sensory deficit. She exhibits normal muscle tone. She displays no seizure activity. Coordination normal.  Skin: Skin is warm and dry. No rash noted.  Psychiatric: She has a normal mood and affect.  Nursing note and vitals reviewed.    ED Treatments / Results  Labs (all labs ordered are listed, but only abnormal results are displayed) Labs Reviewed  COMPREHENSIVE METABOLIC PANEL - Abnormal; Notable for the following components:      Result Value   Potassium 3.3 (*)    Calcium 8.5 (*)    AST 74 (*)    ALT 59 (*)    All other components within normal limits  URINALYSIS, ROUTINE W REFLEX MICROSCOPIC - Abnormal; Notable for the following components:   Color, Urine AMBER (*)    APPearance HAZY (*)    Hgb urine dipstick LARGE (*)    Protein, ur 100 (*)    Nitrite POSITIVE (*)    Leukocytes, UA MODERATE (*)    Squamous Epithelial / LPF 0-5 (*)    All other components within normal limits  CULTURE, BLOOD (ROUTINE X 2)  CULTURE, BLOOD (ROUTINE X 2)  URINE CULTURE  CBC WITH DIFFERENTIAL/PLATELET  PROTIME-INR  I-STAT CG4 LACTIC  ACID, ED  I-STAT BETA HCG BLOOD, ED ([facility], [facility], [facility] ONLY)  I-STAT CG4 LACTIC ACID, ED    EKG  EKG Interpretation None       Radiology Dg Abdomen 1 View  Result Date: 12/26/2017 CLINICAL DATA:  Stent placement with fever EXAM: ABDOMEN - 1 VIEW COMPARISON:  CT 12/23/2017 FINDINGS: Bilateral ureteral stents. Faint calcifications projecting over the lower and upper pole of the right kidney. No definitive calcifications along the course of the right stent. Possible small stone fragments adjacent to the pigtail of the left ureteral stent. IMPRESSION: 1. Nonobstructed gas pattern 2. Bilateral ureteral stents. Multiple faint calcifications within the right kidney 3. Probable small stone fragments adjacent to the distal pigtail of the left ureteral stent. Electronically Signed   By: Jasmine PangKim  Fujinaga M.D.   On: 12/26/2017 21:30    Procedures Procedures (including critical care time)  Medications Ordered in ED Medications  sodium chloride 0.9 % bolus 1,000 mL (0 mLs Intravenous Stopped 12/26/17 2033)    Followed by  sodium chloride 0.9 % bolus 1,000 mL (0 mLs Intravenous Stopped 12/26/17 1944)    Followed by  0.9 %  sodium chloride infusion (1,000 mLs Intravenous New Bag/Given 12/26/17 1910)  cefTRIAXone (ROCEPHIN) 1 g in sodium chloride 0.9 % 100 mL IVPB (0 g Intravenous Stopped 12/26/17 1944)  HYDROmorphone (DILAUDID) injection 1 mg (1 mg Intravenous Given 12/26/17 2228)     Initial Impression / Assessment and Plan / ED Course  I have reviewed the triage vital signs and the nursing notes.  Pertinent labs & imaging results that were available during my care of the patient were reviewed by me and considered in my medical decision making (see chart for details).  Clinical Course as of Dec 27 2235  Thu Dec 26, 2017  2103 D/w Dr. Kathlene NovemberMcCormick.  Requests a KUB to check on stent placement. As long as placement is OK, outpatient treatment is reasonable  [JK]  2237 RR is 16 at the bedside.    [JK]    Clinical  Course User Index [JK] Linwood DibblesKnapp, Season Astacio, MD    Patient  presented to the emergency room for evaluation of fever following a recent urological procedure.  Pt has pyelonephritis but no signs of sepsis.  Laboratory tests are otherwise reassuring with the exception of her urinary tract infection.  Patient was treated with IV fluids and antibiotics.  Her stent placement was checked.  Case was discussed with urology.  Patient improved with treatment and she feels comfortable going home.  Warning signs and precautions were  Final Clinical Impressions(s) / ED Diagnoses   Final diagnoses:  Pyelonephritis    ED Discharge Orders        Ordered    cephALEXin (KEFLEX) 500 MG capsule  4 times daily     12/26/17 2234       Linwood Dibbles, MD 12/26/17 2238

## 2017-12-26 NOTE — ED Notes (Signed)
Lab timed out. Lab is insignificant. Will not recollect due to previous lactic acid results.

## 2017-12-26 NOTE — Discharge Instructions (Signed)
Follow-up with your urologist as planned, take the antibiotics as prescribed, the fever should break in the next 24 to 48 hours, return for worsening symptoms

## 2017-12-28 ENCOUNTER — Telehealth: Payer: Self-pay | Admitting: Urology

## 2017-12-28 LAB — URINE CULTURE: CULTURE: NO GROWTH

## 2017-12-28 NOTE — Telephone Encounter (Signed)
Patient continuing to have fevers of 103.   Her stent was removed yesterday AM. Yesterday we switched her abx from keflex to cipro, she now has had two cipro doses.  (urine cultures were negative)  She is taking anti-pyretics round the clock (Ibuprofen and Tylenol).  I suggested admission for IV abx and to obtain a renal ultrasound to ensure that she hasn't developed an abscess or new/worsening hydro.    Plan for her to give it 7 more hours, and reassess at 5pm whether she is making progress and feeling better.  If not, [facility].  (please call me when she arrives)

## 2017-12-31 LAB — CULTURE, BLOOD (ROUTINE X 2)
CULTURE: NO GROWTH
CULTURE: NO GROWTH
SPECIAL REQUESTS: ADEQUATE
Special Requests: ADEQUATE

## 2018-10-22 HISTORY — PX: TOE SOFT TISSUE RELEASE W/ PINNING: SHX2534

## 2019-04-02 IMAGING — CR DG ABDOMEN 1V
1 series · 1 of 1 positions shown · non-contrast
Comparison: CT 12/23/2017

CLINICAL DATA: Stent placement with fever

EXAM:
ABDOMEN - 1 VIEW

[t abdomen supine]
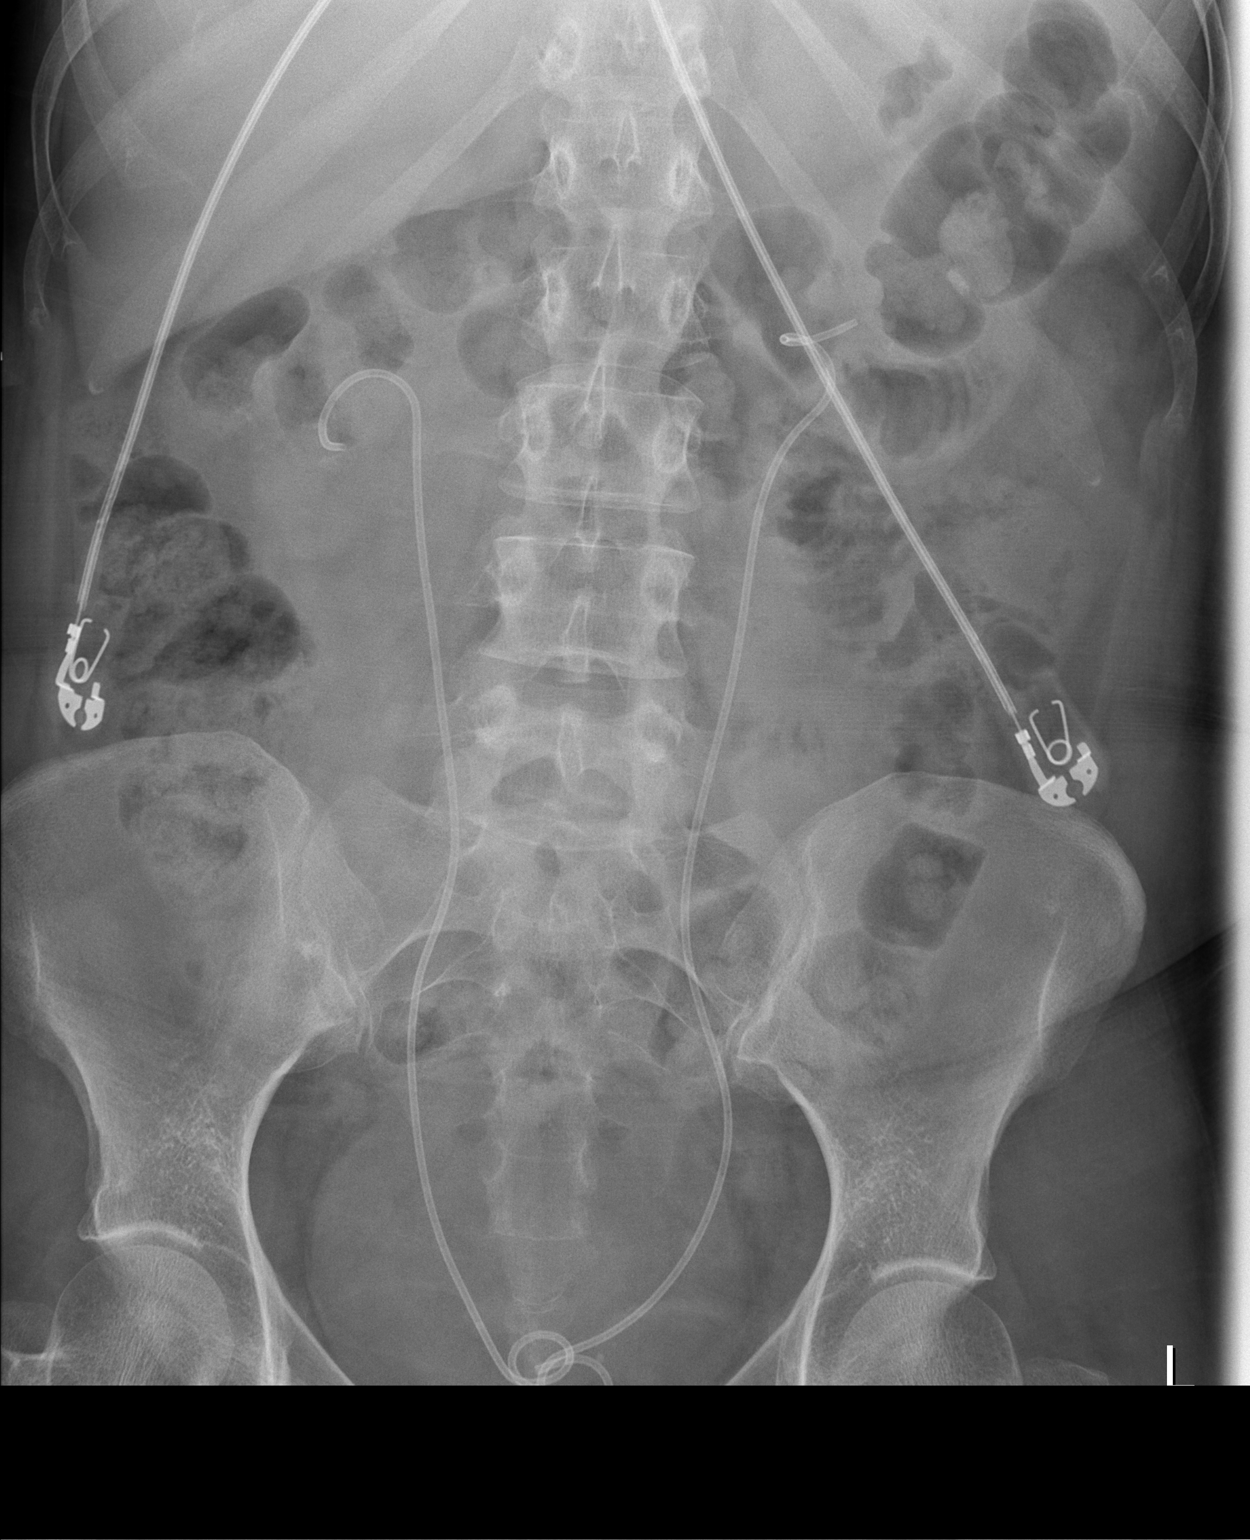

[1 of 1 positions shown; findings below may reference images not displayed]

FINDINGS: Bilateral ureteral stents. Faint calcifications projecting over the
lower and upper pole of the right kidney. No definitive
calcifications along the course of the right stent. Possible small
stone fragments adjacent to the pigtail of the left ureteral stent.
IMPRESSION: 1. Nonobstructed gas pattern
2. Bilateral ureteral stents. Multiple faint calcifications within
the right kidney
3. Probable small stone fragments adjacent to the distal pigtail of
the left ureteral stent.

## 2019-08-30 ENCOUNTER — Emergency Department (HOSPITAL_COMMUNITY): Payer: 59

## 2019-08-30 ENCOUNTER — Encounter (HOSPITAL_COMMUNITY): Payer: Self-pay | Admitting: Emergency Medicine

## 2019-08-30 ENCOUNTER — Other Ambulatory Visit: Payer: Self-pay

## 2019-08-30 ENCOUNTER — Emergency Department (HOSPITAL_COMMUNITY)
Admission: EM | Admit: 2019-08-30 | Discharge: 2019-08-30 | Disposition: A | Discharge status: Home / Self Care | Payer: 59 | Attending: Emergency Medicine | Admitting: Emergency Medicine

## 2019-08-30 DIAGNOSIS — S99922A Unspecified injury of left foot, initial encounter: Secondary | ICD-10-CM | POA: Diagnosis present

## 2019-08-30 DIAGNOSIS — Y929 Unspecified place or not applicable: Secondary | ICD-10-CM | POA: Insufficient documentation

## 2019-08-30 DIAGNOSIS — W2203XA Walked into furniture, initial encounter: Secondary | ICD-10-CM | POA: Diagnosis not present

## 2019-08-30 DIAGNOSIS — Y9389 Activity, other specified: Secondary | ICD-10-CM | POA: Diagnosis not present

## 2019-08-30 DIAGNOSIS — S92515A Nondisplaced fracture of proximal phalanx of left lesser toe(s), initial encounter for closed fracture: Secondary | ICD-10-CM

## 2019-08-30 DIAGNOSIS — J45909 Unspecified asthma, uncomplicated: Secondary | ICD-10-CM | POA: Insufficient documentation

## 2019-08-30 DIAGNOSIS — Y999 Unspecified external cause status: Secondary | ICD-10-CM | POA: Insufficient documentation

## 2019-08-30 DIAGNOSIS — Z79899 Other long term (current) drug therapy: Secondary | ICD-10-CM | POA: Insufficient documentation

## 2019-08-30 MED ORDER — LIDOCAINE HCL (PF) 1 % IJ SOLN
5.0000 mL | Freq: Once | INTRAMUSCULAR | Status: AC
  Administered 2019-08-30: 03:00:00 5 mL via INTRADERMAL
  Filled 2019-08-30: qty 5

## 2019-08-30 NOTE — ED Provider Notes (Signed)
[facility] Provider Note   CSN: [id number] Arrival date & time: 08/30/19  0133     History   Chief Complaint Chief Complaint  Patient presents with  . Foot Pain    HPI [facility] is a 38 y.o. female with a hx of rhabdomyolysis presents to the Emergency Department complaining of acute, persistent left little toe pain onset approximately 3 hours prior to arrival.  Patient reports she was stepping off the couch when she caught her left toe on the edge.  Patient states she bent her toe back completely and heard a snap with immediate pain.  Patient denies numbness, tingling or weakness.  She has been able to walk however this causes significant pain.  She did buddy tape her toes however this has not significantly improved her pain.  No other treatments prior to arrival.  No open wounds.  Patient reports drinking both before and after the accident.    The history is provided by the patient and medical records. No language interpreter was used.    Past Medical History:  Diagnosis Date  . Complication of anesthesia    per pt hard to wake  . Hematuria   . History of exercise stress test    ETT 03-09-2016 in careeverywhere in epic-- result negative  . History of gestational hypertension 07/2017   preeclamptic  . History of kidney stones   . History of pre-eclampsia    10/ 2018;  2010  . History of rhabdomyolysis 12/2013  . History of supraventricular tachycardia    per pt hx one episode , last halter montior  05/ 2017 note svt ectopy x4 in careeverywhere from [facility] in [place]  . HSV-1 (herpes simplex virus 1) infection   . Migraines   . Mild asthma    prn inhaler  . Mitral valve prolapse    per pt dx mvp yrs ago and has been followed cardiologist---  last office visit 05/ 2017 [facility] in [place].  in [place]  . MTHFR mutation ([place])   . Raynauds syndrome    takes procardia  . Wears contact lenses     Patient  Active Problem List   Diagnosis Date Noted  . Pelvic pain 12/22/2017  . SVD (spontaneous vaginal delivery) / Doren Custard 11/21 09/12/2017  . Gestational hypertension 08/10/2017  . Rhabdomyolysis 01/08/2014    Past Surgical History:  Procedure Laterality Date  . ANTERIOR CRUCIATE LIGAMENT REPAIR Right 10/2015  . CYSTOSCOPY/URETEROSCOPY/HOLMIUM LASER/STENT PLACEMENT Bilateral 12/24/2017   Procedure: CYSTOSCOPY/RETROGRADE/URETEROSCOPY/HOLMIUM LASER LITHOTRIPSY, STONE BASKETRY /STENT PLACEMENT;  Surgeon: Ceasar Mons, MD;  Location: [facility];  Service: Urology;  Laterality: Bilateral;  . HOLMIUM LASER APPLICATION Bilateral 02/21/8412   Procedure: HOLMIUM LASER APPLICATION;  Surgeon: Ceasar Mons, MD;  Location[facility];  Service: Urology;  Laterality: Bilateral;  . NASAL SEPTUM SURGERY  02/2013  . TONSILLECTOMY  1991     OB History    Gravida  2   Para  1   Term  0   Preterm  1   AB  0   Living  1     SAB  0   TAB  0   Ectopic  0   Multiple  0   Live Births  1            Home Medications    Prior to Admission medications   Medication Sig Start Date End Date Taking? Authorizing Provider  albuterol (PROVENTIL  HFA;VENTOLIN HFA) 108 (90 Base) MCG/ACT inhaler Inhale 1-2 puffs into the lungs every 6 (six) hours as needed for wheezing or shortness of breath.    [provider]  ALFALFA PO Take 1,000 mg by mouth daily.     [provider]  cephALEXin (KEFLEX) 500 MG capsule Take 1 capsule (500 mg total) by mouth 4 (four) times daily. 12/26/17   Linwood Dibbles, MD  cetirizine (ZYRTEC) 10 MG tablet Take 10 mg by mouth daily.    [provider]  EPINEPHrine (EPIPEN 2-PAK) 0.3 mg/0.3 mL IJ SOAJ injection Inject 0.3 mg into the skin daily as needed. For allergic reaction 06/22/14   [provider]  famotidine (PEPCID) 20 MG tablet Take 20 mg by mouth as needed.    [provider]   HYDROmorphone (DILAUDID) 2 MG tablet Take 2 tablets (4 mg total) by mouth every 4 (four) hours as needed for up to 365 doses for severe pain. 12/22/17   Neta Mends, CNM  ibuprofen (ADVIL,MOTRIN) 800 MG tablet Take 1 tablet (800 mg total) by mouth every 8 (eight) hours as needed. 12/22/17   Neta Mends, CNM  MAGNESIUM MALATE PO Take 1,000 mg by mouth daily.    [provider]  NIFEdipine (PROCARDIA-XL/ADALAT-CC/NIFEDICAL-XL) 30 MG 24 hr tablet Take 30 mg by mouth every evening.    [provider]  Prenatal Vit-Fe Fumarate-FA (PRENATAL MULTIVITAMIN) TABS tablet Take 1 tablet by mouth daily at 12 noon.    [provider]  Probiotic Product (PROBIOTIC PO) Take 2 capsules by mouth daily.    [provider]  promethazine (PHENERGAN) 12.5 MG tablet Take 2 tablets (25 mg total) by mouth every 6 (six) hours as needed for nausea or vomiting. 12/22/17   Neta Mends, CNM  SUMAtriptan (IMITREX) 100 MG tablet Take 100 mg by mouth every 2 (two) hours as needed for migraine. May repeat in 2 hours if headache persists or recurs.    [provider]  tamsulosin (FLOMAX) 0.4 MG CAPS capsule Take 1 capsule (0.4 mg total) by mouth daily after supper. Patient taking differently: Take 0.4 mg by mouth every evening.  12/22/17   Neta Mends, CNM    Family History Family History  Problem Relation Age of Onset  . Diabetes Mother   . Hypertension Mother   . Diabetes Father   . Hypertension Father   . Other Father        TIA  . Cancer Maternal Grandmother   . Stroke Paternal Grandfather     Social History Social History   Tobacco Use  . Smoking status: Never Smoker  . Smokeless tobacco: Never Used  Substance Use Topics  . Alcohol use: Yes    Comment: occ  . Drug use: No     Allergies   Aspirin, Other, Codeine, Hydrocodone, and Betamethasone   Review of Systems Review of Systems  Constitutional: Negative for chills and fever.  Gastrointestinal:  Negative for nausea and vomiting.  Musculoskeletal: Positive for arthralgias and joint swelling. Negative for back pain, neck pain and neck stiffness.  Skin: Negative for wound.  Neurological: Negative for numbness.  Hematological: Does not bruise/bleed easily.  Psychiatric/Behavioral: The patient is not nervous/anxious.   All other systems reviewed and are negative.    Physical Exam Updated Vital Signs BP 127/90   Pulse 97   Temp 98.1 F (36.7 C) (Oral)   Resp 19   Ht  (1.651 m)   Wt 86.2 kg  SpO2 99%   BMI 31.62 kg/m   Physical Exam Vitals signs and nursing note reviewed.  Constitutional:      General: She is not in acute distress.    Appearance: She is well-developed. She is not diaphoretic.  HENT:     Head: Normocephalic and atraumatic.  Eyes:     Conjunctiva/sclera: Conjunctivae normal.  Neck:     Musculoskeletal: Normal range of motion.  Cardiovascular:     Rate and Rhythm: Normal rate and regular rhythm.     Pulses:          Dorsalis pedis pulses are 2+ on the left side.       Posterior tibial pulses are 2+ on the left side.     Comments: Capillary refill < 3 sec Pulmonary:     Effort: Pulmonary effort is normal.  Musculoskeletal:        General: Tenderness present.     Left knee: Normal.     Left ankle: Normal.     Comments: ROM: Full range of motion of the great toe and toes 2 3 and 4 on the left foot.  Decreased range of motion of the left little toe.  Swelling and ecchymosis noted to the left little toe without open wounds.  Skin:    General: Skin is warm and dry.     Comments: No tenting of the skin  Neurological:     Mental Status: She is alert.     Coordination: Coordination normal.     Comments: Sensation intact in all toes of the left foot Strength 5/5 dorsiflexion and plantarflexion of the ankle and great toe of the left foot.      ED Treatments / Results   Radiology Dg Foot Complete Left  Result Date: 08/30/2019 CLINICAL DATA:   Initial evaluation for acute trauma status post injury, pain at lateral left foot. EXAM: LEFT FOOT - COMPLETE 3+ VIEW COMPARISON:  None. FINDINGS: Acute slightly comminuted transverse fracture through the mid aspect of the left fifth proximal phalanx. No intra-articular extension. Overlying soft tissue swelling. IMPRESSION: Acute slightly comminuted transverse fracture through the mid aspect of the left fifth proximal phalanx. No intra-articular extension. Electronically Signed   By: Rise MuBenjamin  McClintock M.D.   On: 08/30/2019 02:11    Procedures .Splint Application  Date/Time: 08/30/2019 3:18 AM Performed by: Dierdre ForthMuthersbaugh, Clayborn Milnes, PA-C Authorized by: Dierdre ForthMuthersbaugh, Johnwilliam Shepperson, PA-C   Consent:    Consent obtained:  Verbal   Consent given by:  Patient   Risks discussed:  Numbness, discoloration and pain   Alternatives discussed:  No treatment Pre-procedure details:    Sensation:  Normal   Skin color:  Flesh Procedure details:    Laterality:  Left   Location:  Toe   Toe:  L little toe   Supplies:  Elastic bandage Post-procedure details:    Pain:  Improved   Sensation:  Normal   Skin color:  Flesh   Patient tolerance of procedure:  Tolerated well, no immediate complications Comments:     Hematoma block placed prior to splint   (including critical care time)  Medications Ordered in ED Medications  lidocaine (PF) (XYLOCAINE) 1 % injection 5 mL (5 mLs Intradermal Given 08/30/19 0301)     Initial Impression / Assessment and Plan / ED Course  I have reviewed the triage vital signs and the nursing notes.  Pertinent labs & imaging results that were available during my care of the patient were reviewed by me and considered in my medical  decision making (see chart for details).        Patient presents with toe injury.  X-ray with slightly comminuted fracture through the left phalanx of the fifth toe of the left foot.  Hematoma block for pain control.  Buddy taping and splinting along with  postop shoe and crutches given.  Patient will need follow-up with orthopedics.  Discussed conservative therapies at home.  Patient states understanding and is in agreement with the plan.   Final Clinical Impressions(s) / ED Diagnoses   Final diagnoses:  Closed nondisplaced fracture of proximal phalanx of lesser toe of left foot, initial encounter    ED Discharge Orders    None       Mardene Sayer Boyd Kerbs 08/30/19 0326    Nira Conn, MD 08/30/19 [phone]

## 2019-08-30 NOTE — ED Notes (Signed)
Ortho at bedside.

## 2019-08-30 NOTE — Discharge Instructions (Addendum)
1. Medications: alternate naprosyn and tylenol for pain control, usual home medications 2. Treatment: rest, ice, elevate and use brace and crutches, drink plenty of fluids,  3. Follow Up: Please followup with orthopedics as directed in 1 week if no improvement for discussion of your diagnoses and further evaluation after today's visit; if you do not have a primary care doctor use the resource guide provided to find one; Please return to the ER for worsening symptoms or other concerns

## 2019-08-30 NOTE — ED Triage Notes (Signed)
Pt from home c/o left pinky toe injury. Pt states drinking 3 alcoholic drinks this evening.

## 2019-08-30 NOTE — ED Notes (Signed)
Discharge instructions discussed with pt. Pt verbalized understanding with no questions at this time. Pt to go home with spouse. Pt to follow up with ortho in 1 week

## 2019-08-30 NOTE — Progress Notes (Signed)
Orthopedic Tech Progress Note Patient Details:  [facility] 07/29/1981 [id number]  Ortho Devices Type of Ortho Device: Postop shoe/boot, Crutches Ortho Device/Splint Location: lle Ortho Device/Splint Interventions: Ordered, Application, Adjustment   Post Interventions Patient Tolerated: Well Instructions Provided: Care of device, Adjustment of device   Karolee Stamps 08/30/2019, 4:13 AM

## 2019-10-12 ENCOUNTER — Other Ambulatory Visit: Payer: 59

## 2020-04-05 ENCOUNTER — Other Ambulatory Visit: Payer: Self-pay

## 2020-04-05 ENCOUNTER — Ambulatory Visit: Payer: 59 | Admitting: Physician Assistant

## 2020-04-05 ENCOUNTER — Encounter: Payer: Self-pay | Admitting: Physician Assistant

## 2020-04-05 ENCOUNTER — Telehealth: Payer: Self-pay | Admitting: Physician Assistant

## 2020-04-05 VITALS — BP 122/82 | HR 74 | Temp 98.3°F | Ht 65.0 in | Wt 205.6 lb

## 2020-04-05 DIAGNOSIS — M6282 Rhabdomyolysis: Secondary | ICD-10-CM | POA: Diagnosis not present

## 2020-04-05 DIAGNOSIS — F988 Other specified behavioral and emotional disorders with onset usually occurring in childhood and adolescence: Secondary | ICD-10-CM | POA: Diagnosis not present

## 2020-04-05 DIAGNOSIS — I7300 Raynaud's syndrome without gangrene: Secondary | ICD-10-CM

## 2020-04-05 DIAGNOSIS — G43109 Migraine with aura, not intractable, without status migrainosus: Secondary | ICD-10-CM | POA: Diagnosis not present

## 2020-04-05 DIAGNOSIS — M3501 Sicca syndrome with keratoconjunctivitis: Secondary | ICD-10-CM | POA: Diagnosis not present

## 2020-04-05 DIAGNOSIS — Z87442 Personal history of urinary calculi: Secondary | ICD-10-CM | POA: Insufficient documentation

## 2020-04-05 DIAGNOSIS — Z0000 Encounter for general adult medical examination without abnormal findings: Secondary | ICD-10-CM

## 2020-04-05 LAB — COMPREHENSIVE METABOLIC PANEL
ALT: 19 U/L (ref 0–35)
AST: 18 U/L (ref 0–37)
Albumin: 4.7 g/dL (ref 3.5–5.2)
Alkaline Phosphatase: 96 U/L (ref 39–117)
BUN: 8 mg/dL (ref 6–23)
CO2: 28 mEq/L (ref 19–32)
Calcium: 9.4 mg/dL (ref 8.4–10.5)
Chloride: 103 mEq/L (ref 96–112)
Creatinine, Ser: 0.65 mg/dL (ref 0.40–1.20)
GFR: 102.83 mL/min (ref >60.00)
Glucose, Bld: 97 mg/dL (ref 70–99)
Potassium: 4.1 mEq/L (ref 3.5–5.1)
Sodium: 140 mEq/L (ref 135–145)
Total Bilirubin: 0.7 mg/dL (ref 0.2–1.2)
Total Protein: 6.8 g/dL (ref 6.0–8.3)

## 2020-04-05 LAB — CBC WITH DIFFERENTIAL/PLATELET
Basophils Absolute: 0.1 10*3/uL (ref 0.0–0.1)
Basophils Relative: 0.8 % (ref 0.0–3.0)
Eosinophils Absolute: 0.4 10*3/uL (ref 0.0–0.7)
Eosinophils Relative: 4.6 % (ref 0.0–5.0)
HCT: 39.5 % (ref 36.0–46.0)
Hemoglobin: 13.4 g/dL (ref 12.0–15.0)
Lymphocytes Relative: 33.2 % (ref 12.0–46.0)
Lymphs Abs: 2.5 10*3/uL (ref 0.7–4.0)
MCHC: 33.9 g/dL (ref 30.0–36.0)
MCV: 86.1 fl (ref 78.0–100.0)
Monocytes Absolute: 0.4 10*3/uL (ref 0.1–1.0)
Monocytes Relative: 4.6 % (ref 3.0–12.0)
Neutro Abs: 4.3 10*3/uL (ref 1.4–7.7)
Neutrophils Relative %: 56.8 % (ref 43.0–77.0)
Platelets: 341 10*3/uL (ref 150.0–400.0)
RBC: 4.59 Mil/uL (ref 3.87–5.11)
RDW: 13 % (ref 11.5–15.5)
WBC: 7.6 10*3/uL (ref 4.0–10.5)

## 2020-04-05 LAB — LIPID PANEL
Cholesterol: 198 mg/dL (ref 0–200)
HDL: 65.9 mg/dL (ref >39.00)
LDL Cholesterol: 113 mg/dL — ABNORMAL HIGH (ref 0–99)
NonHDL: 132.33
Total CHOL/HDL Ratio: 3
Triglycerides: 98 mg/dL (ref 0.0–149.0)
VLDL: 19.6 mg/dL (ref 0.0–40.0)

## 2020-04-05 LAB — HEMOGLOBIN A1C: Hgb A1c MFr Bld: 6.3 % (ref 4.6–6.5)

## 2020-04-05 LAB — VITAMIN D 25 HYDROXY (VIT D DEFICIENCY, FRACTURES): VITD: 30.29 ng/mL (ref 30.00–100.00)

## 2020-04-05 LAB — TSH: TSH: 1.42 u[IU]/mL (ref 0.35–4.50)

## 2020-04-05 MED ORDER — TOPIRAMATE 50 MG PO TABS: ORAL_TABLET | ORAL | 2 refills | Status: DC

## 2020-04-05 MED ORDER — BUTALBITAL-APAP-CAFFEINE 50-325-40 MG PO TABS: 1.0000 | ORAL_TABLET | Freq: Four times a day (QID) | ORAL | 0 refills | Status: AC | PRN

## 2020-04-05 MED ORDER — AMPHETAMINE-DEXTROAMPHET ER 10 MG PO CP24: 10.0000 mg | ORAL_CAPSULE | Freq: Every day | ORAL | 0 refills | Status: DC

## 2020-04-05 MED ORDER — TOPIRAMATE 50 MG PO TABS
ORAL_TABLET | ORAL | 2 refills | Status: DC
Start: 2020-04-05 — End: 2020-04-06

## 2020-04-05 NOTE — Telephone Encounter (Signed)
Called patient and told her that [facility] had resubmitted the rx.

## 2020-04-05 NOTE — Patient Instructions (Signed)
Please go to the lab for blood work.   Our office will call you with your results unless you have chosen to receive results via MyChart.  If your blood work is normal we will follow-up each year for physicals and as scheduled for chronic medical problems.  If anything is abnormal we will treat accordingly and get you in for a follow-up.  For the migraines, I have sent in a prescription for Topamax for you. You are to take 25 mg once daily for 2 weeks, then increase to 50 mg daily.  The Fioricet is to use only as directed if needed for severe migraine not responsive to OTC agents and sleep.  We will follow-up in 4 weeks to reassess and make further adjustments.   I am also sending in a script for Adderall XR 10 mg to help with ADD symptoms. Please let me know if this is working well like before or if we need to consider adjusting dose or trying a new medication.   You will be contacted for assessment with Rheumatology.  It was very nice meeting you today. Welcome to [facility]!   Preventive Care 37-35 Years Old, Female Preventive care refers to visits with your health care provider and lifestyle choices that can promote health and wellness. This includes:  A yearly physical exam. This may also be called an annual well check.  Regular dental visits and eye exams.  Immunizations.  Screening for certain conditions.  Healthy lifestyle choices, such as eating a healthy diet, getting regular exercise, not using drugs or products that contain nicotine and tobacco, and limiting alcohol use. What can I expect for my preventive care visit? Physical exam Your health care provider will check your:  Height and weight. This may be used to calculate body mass index (BMI), which tells if you are at a healthy weight.  Heart rate and blood pressure.  Skin for abnormal spots. Counseling Your health care provider may ask you questions about your:  Alcohol, tobacco, and drug use.  Emotional  well-being.  Home and relationship well-being.  Sexual activity.  Eating habits.  Work and work Statistician.  Method of birth control.  Menstrual cycle.  Pregnancy history. What immunizations do I need?  Influenza (flu) vaccine  This is recommended every year. Tetanus, diphtheria, and pertussis (Tdap) vaccine  You may need a Td booster every 10 years. Varicella (chickenpox) vaccine  You may need this if you have not been vaccinated. Human papillomavirus (HPV) vaccine  If recommended by your health care provider, you may need three doses over 6 months. Measles, mumps, and rubella (MMR) vaccine  You may need at least one dose of MMR. You may also need a second dose. Meningococcal conjugate (MenACWY) vaccine  One dose is recommended if you are age 37-21 years and a first-year college student living in a residence hall, or if you have one of several medical conditions. You may also need additional booster doses. Pneumococcal conjugate (PCV13) vaccine  You may need this if you have certain conditions and were not previously vaccinated. Pneumococcal polysaccharide (PPSV23) vaccine  You may need one or two doses if you smoke cigarettes or if you have certain conditions. Hepatitis A vaccine  You may need this if you have certain conditions or if you travel or work in places where you may be exposed to hepatitis A. Hepatitis B vaccine  You may need this if you have certain conditions or if you travel or work in places where you may  be exposed to hepatitis B. Haemophilus influenzae type b (Hib) vaccine  You may need this if you have certain conditions. You may receive vaccines as individual doses or as more than one vaccine together in one shot (combination vaccines). Talk with your health care provider about the risks and benefits of combination vaccines. What tests do I need?  Blood tests  Lipid and cholesterol levels. These may be checked every 5 years starting at age  37.  Hepatitis C test.  Hepatitis B test. Screening  Diabetes screening. This is done by checking your blood sugar (glucose) after you have not eaten for a while (fasting).  Sexually transmitted disease (STD) testing.  BRCA-related cancer screening. This may be done if you have a family history of breast, ovarian, tubal, or peritoneal cancers.  Pelvic exam and Pap test. This may be done every 3 years starting at age 37. Starting at age 60, this may be done every 5 years if you have a Pap test in combination with an HPV test. Talk with your health care provider about your test results, treatment options, and if necessary, the need for more tests. Follow these instructions at home: Eating and drinking   Eat a diet that includes fresh fruits and vegetables, whole grains, lean protein, and low-fat dairy.  Take vitamin and mineral supplements as recommended by your health care provider.  Do not drink alcohol if: ? Your health care provider tells you not to drink. ? You are pregnant, may be pregnant, or are planning to become pregnant.  If you drink alcohol: ? Limit how much you have to 0-1 drink a day. ? Be aware of how much alcohol is in your drink. In the [place] one drink equals one 12 oz bottle of beer (355 mL), one 5 oz glass of wine (148 mL), or one 1 oz glass of hard liquor (44 mL). Lifestyle  Take daily care of your teeth and gums.  Stay active. Exercise for at least 30 minutes on 5 or more days each week.  Do not use any products that contain nicotine or tobacco, such as cigarettes, e-cigarettes, and chewing tobacco. If you need help quitting, ask your health care provider.  If you are sexually active, practice safe sex. Use a condom or other form of birth control (contraception) in order to prevent pregnancy and STIs (sexually transmitted infections). If you plan to become pregnant, see your health care provider for a preconception visit. What's next?  Visit your health  care provider once a year for a well check visit.  Ask your health care provider how often you should have your eyes and teeth checked.  Stay up to date on all vaccines. This information is not intended to replace advice given to you by your health care provider. Make sure you discuss any questions you have with your health care provider. Document Revised: 06/19/2018 Document Reviewed: 06/19/2018 Elsevier Patient Education  2020 [employer].

## 2020-04-05 NOTE — Telephone Encounter (Signed)
Patient said that 2 of the prescriptions you called in were at the pharmacy, but the the topomax was not.  Please advise

## 2020-04-05 NOTE — Telephone Encounter (Signed)
They were all sent at the same time. Odd they only got two. I resubmitted the script to the pharmacy.

## 2020-04-05 NOTE — Progress Notes (Signed)
Patient presents to clinic today to establish care.  Migraine -- Notes diagnosis at age 39, worsening in later teen years. Prior to last pregnancy she was maintained on a regimen of Topamax 100 mg daily with great control of frequent migraine headaches. Came off of before getting pregnant and has remained off through breastfeeding. Notes she is no longer breastfeeding and would like to restart medication. In terms of her headaches she notes that they can be on either side but usually L-sided and preceded with aura or flashing lights and tunnel vision. Associated symptoms include both nausea and photophobia. Occasional phonophobia. When mild has taken OTC Excedrin. For more severe breakthrough headaches was on a regimen of Fioricet which she states she may have had to take 3-4 x yearly.    ADD -- diagnosed in adulthood. Typically will compensate with list making. Mainly a problem with school on top of work.   Previously went back to school -- this was when she received a formal diagnosis. Previously on Adderall 10 XR. Notes taking and tolerating well with good result. When she was done with her schooling she stopped the medication as she felt it was no longer needed. Is starting a nurse midwifery program soon and is wanting to see about restarting medication.   Health Maintenance: Immunizations -- TDaP PAP --  Last in 2018. + HPV. Followed by Lynnell Chad.   Past Medical History:  Diagnosis Date  . Allergy   . Anemia   . Complication of anesthesia    per pt hard to wake  . Heart murmur   . Hematuria   . History of exercise stress test    ETT 03-09-2016 in careeverywhere in [place]-- result negative  . History of gestational hypertension 07/2017   preeclamptic  . History of kidney stones   . History of pre-eclampsia    10/ 2018;  2010  . History of rhabdomyolysis 12/2013  . History of supraventricular tachycardia    per pt hx one episode , last halter montior  05/ 2017 note svt ectopy x4  in careeverywhere from [facility] in [place]  . HSV-1 (herpes simplex virus 1) infection   . Migraine   . Migraines   . Mild asthma    prn inhaler  . Mitral valve prolapse    per pt dx mvp yrs ago and has been followed cardiologist---  last office visit 05/ 2017 [facility] in [place].  in [place]  . MTHFR mutation ([place])   . Neuromuscular disorder ([place])   . Raynauds syndrome    takes procardia  . Wears contact lenses     Past Surgical History:  Procedure Laterality Date  . ANTERIOR CRUCIATE LIGAMENT REPAIR Right 10/2015  . CYSTOSCOPY/URETEROSCOPY/HOLMIUM LASER/STENT PLACEMENT Bilateral 12/24/2017   Procedure: CYSTOSCOPY/RETROGRADE/URETEROSCOPY/HOLMIUM LASER LITHOTRIPSY, STONE BASKETRY /STENT PLACEMENT;  Surgeon: Ceasar Mons, MD;  Location: [facility];  Service: Urology;  Laterality: Bilateral;  . HOLMIUM LASER APPLICATION Bilateral 01/23/3153   Procedure: HOLMIUM LASER APPLICATION;  Surgeon: Ceasar Mons, MD;  Location: [facility];  Service: Urology;  Laterality: Bilateral;  . KIDNEY STONE SURGERY    . NASAL SEPTUM SURGERY  02/2013  . NASAL SINUS SURGERY    . TOE SOFT TISSUE RELEASE W/ PINNING  2020   5th toe  . TONSILLECTOMY  1991    Current Outpatient Medications on File Prior to Visit  Medication Sig Dispense Refill  . albuterol (PROVENTIL HFA;VENTOLIN HFA) 108 (90 Base) MCG/ACT inhaler Inhale 1-2  puffs into the lungs every 6 (six) hours as needed for wheezing or shortness of breath.    Marland Kitchen ALFALFA PO Take 1,000 mg by mouth daily.     . cetirizine (ZYRTEC) 10 MG tablet Take 10 mg by mouth daily.    Marland Kitchen EPINEPHrine (EPIPEN 2-PAK) 0.3 mg/0.3 mL IJ SOAJ injection Inject 0.3 mg into the skin daily as needed. For allergic reaction    . famotidine (PEPCID) 20 MG tablet Take 20 mg by mouth as needed.    Marland Kitchen MAGNESIUM MALATE PO Take 1,000 mg by mouth daily.    . Prenatal Vit-Fe Fumarate-FA (PRENATAL MULTIVITAMIN) TABS  tablet Take 1 tablet by mouth daily at 12 noon.    . Probiotic Product (PROBIOTIC PO) Take 2 capsules by mouth daily.     No current facility-administered medications on file prior to visit.    Allergies  Allergen Reactions  . Aspirin Hives  . Other Anaphylaxis    hazelnuts  . Codeine Nausea And Vomiting and Swelling  . Hydrocodone Nausea And Vomiting and Swelling  . Betamethasone Rash    On face and neck    Family History  Problem Relation Age of Onset  . Diabetes Mother   . Hypertension Mother   . Diabetes Father   . Hypertension Father   . Other Father        TIA  . Stroke Father   . Cancer Maternal Grandmother   . Stroke Paternal Grandfather   . Diabetes Brother     Social History   Socioeconomic History  . Marital status: Married    Spouse name: Not on file  . Number of children: 2  . Years of education: Not on file  . Highest education level: Not on file  Occupational History  . Not on file  Tobacco Use  . Smoking status: Never Smoker  . Smokeless tobacco: Never Used  Vaping Use  . Vaping Use: Never used  Substance and Sexual Activity  . Alcohol use: Yes    Comment: occ  . Drug use: No  . Sexual activity: Yes    Birth control/protection: None  Other Topics Concern  . Not on file  Social History Narrative  . Not on file   Social Determinants of Health   Financial Resource Strain:   . Difficulty of Paying Living Expenses:   Food Insecurity:   . Worried About Programme researcher, broadcasting/film/video in the Last Year:   . Barista in the Last Year:   Transportation Needs:   . Freight forwarder (Medical):   Marland Kitchen Lack of Transportation (Non-Medical):   Physical Activity:   . Days of Exercise per Week:   . Minutes of Exercise per Session:   Stress:   . Feeling of Stress :   Social Connections:   . Frequency of Communication with Friends and Family:   . Frequency of Social Gatherings with Friends and Family:   . Attends Religious Services:   . Active  Member of Clubs or Organizations:   . Attends Banker Meetings:   Marland Kitchen Marital Status:   Intimate Partner Violence:   . Fear of Current or Ex-Partner:   . Emotionally Abused:   Marland Kitchen Physically Abused:   . Sexually Abused:    ROS Pertinent ROS are listed in the HPI.  BP 122/82   Pulse 74   Temp 98.3 F (36.8 C)   Ht 5\' 5"  (1.651 m)   Wt 205 lb 9.6 oz (93.3 kg)  SpO2 100%   BMI 34.21 kg/m   Physical Exam Vitals reviewed.  HENT:     Head: Normocephalic and atraumatic.     Right Ear: Tympanic membrane, ear canal and external ear normal.     Left Ear: Tympanic membrane, ear canal and external ear normal.     Nose: Nose normal. No mucosal edema.     Mouth/Throat:     Pharynx: Uvula midline. No oropharyngeal exudate or posterior oropharyngeal erythema.  Eyes:     Conjunctiva/sclera: Conjunctivae normal.     Pupils: Pupils are equal, round, and reactive to light.  Neck:     Thyroid: No thyromegaly.  Cardiovascular:     Rate and Rhythm: Normal rate and regular rhythm.     Heart sounds: Normal heart sounds.  Pulmonary:     Effort: Pulmonary effort is normal. No respiratory distress.     Breath sounds: Normal breath sounds. No wheezing or rales.  Abdominal:     General: Bowel sounds are normal. There is no distension.     Palpations: Abdomen is soft. There is no mass.     Tenderness: There is no abdominal tenderness. There is no guarding or rebound.  Musculoskeletal:     Cervical back: Neck supple.  Lymphadenopathy:     Cervical: No cervical adenopathy.  Skin:    General: Skin is warm and dry.     Findings: No rash.  Neurological:     Mental Status: She is alert and oriented to person, place, and time.     Cranial Nerves: No cranial nerve deficit.    Assessment/Plan: 1. Visit for preventive health examination Depression screen negative. Health Maintenance reviewed -- She is to follow-up with GYN for PAP. Preventive schedule discussed and handout given in  AVS. Will obtain fasting labs today.  - CBC with Differential/Platelet - Lipid panel - Hemoglobin A1c - Comprehensive metabolic panel - TSH - Vitamin D (25 hydroxy)  2. Migraine with aura and without status migrainosus, not intractable We will restart Topamax at 25 mg once daily x2 weeks, increasing to 50 mg once daily.  Rx Fioricet sent to pharmacy to be kept on file, small amount for abortive therapy if needed.  Follow-up in a month for reassessment.  3. Attention deficit disorder (ADD) without hyperactivity We will restart Adderall XR 10 mg once daily.  She will start taking when school starts.  Follow-up scheduled.  4. Sjogren's syndrome with keratoconjunctivitis sicca (HCC) 5. Non-traumatic rhabdomyolysis Stable.  Referral to rheumatology in the area placed for follow-up and ongoing management.  6. Raynaud's phenomenon without gangrene Winter symptoms only.  Mild.  Controlled with supportive measures.  Will monitor.  This visit occurred during the SARS-CoV-2 public health emergency.  Safety protocols were in place, including screening questions prior to the visit, additional usage of staff PPE, and extensive cleaning of exam room while observing appropriate contact time as indicated for disinfecting solutions.     Piedad Climes, PA-C

## 2020-04-06 ENCOUNTER — Telehealth: Payer: Self-pay | Admitting: Physician Assistant

## 2020-04-06 ENCOUNTER — Other Ambulatory Visit: Payer: Self-pay

## 2020-04-06 DIAGNOSIS — R7309 Other abnormal glucose: Secondary | ICD-10-CM

## 2020-04-06 MED ORDER — TOPIRAMATE 25 MG PO TABS: 25.0000 mg | ORAL_TABLET | Freq: Every day | ORAL | 1 refills | Status: DC

## 2020-04-06 NOTE — Telephone Encounter (Signed)
Pt called in asking if Shelby Norton could call in Topamax 25mg  tablet. She states she is having a difficult time splitting the tablets so they will be equal doses. She also states that if she does well on the 25mg  tablets she would like to stay on that. Please advise, pt is aware is unavailable until this afternoon and she can be reached at the home #

## 2020-04-06 NOTE — Telephone Encounter (Signed)
Please let patient know that Rx for the 25 mg tablet has been sent in for her. Thank you.

## 2020-04-06 NOTE — Telephone Encounter (Signed)
Left patient a message informing her that her prescription has been sent to her pharmacy.

## 2020-04-07 ENCOUNTER — Other Ambulatory Visit: Payer: Self-pay

## 2020-04-07 ENCOUNTER — Encounter (INDEPENDENT_AMBULATORY_CARE_PROVIDER_SITE_OTHER): Payer: Self-pay | Admitting: Family Medicine

## 2020-04-07 ENCOUNTER — Ambulatory Visit (INDEPENDENT_AMBULATORY_CARE_PROVIDER_SITE_OTHER): Payer: 59 | Admitting: Family Medicine

## 2020-04-07 VITALS — BP 132/82 | HR 77 | Temp 98.3°F | Ht 65.0 in | Wt 202.0 lb

## 2020-04-07 DIAGNOSIS — E559 Vitamin D deficiency, unspecified: Secondary | ICD-10-CM

## 2020-04-07 DIAGNOSIS — R7303 Prediabetes: Secondary | ICD-10-CM | POA: Diagnosis not present

## 2020-04-07 DIAGNOSIS — E669 Obesity, unspecified: Secondary | ICD-10-CM

## 2020-04-07 DIAGNOSIS — Z1331 Encounter for screening for depression: Secondary | ICD-10-CM | POA: Diagnosis not present

## 2020-04-07 DIAGNOSIS — F988 Other specified behavioral and emotional disorders with onset usually occurring in childhood and adolescence: Secondary | ICD-10-CM

## 2020-04-07 DIAGNOSIS — G43809 Other migraine, not intractable, without status migrainosus: Secondary | ICD-10-CM | POA: Diagnosis not present

## 2020-04-07 DIAGNOSIS — Z8659 Personal history of other mental and behavioral disorders: Secondary | ICD-10-CM

## 2020-04-07 DIAGNOSIS — R0602 Shortness of breath: Secondary | ICD-10-CM | POA: Diagnosis not present

## 2020-04-07 DIAGNOSIS — R5383 Other fatigue: Secondary | ICD-10-CM

## 2020-04-07 DIAGNOSIS — Z6833 Body mass index (BMI) 33.0-33.9, adult: Secondary | ICD-10-CM

## 2020-04-07 DIAGNOSIS — Z9189 Other specified personal risk factors, not elsewhere classified: Secondary | ICD-10-CM | POA: Diagnosis not present

## 2020-04-07 DIAGNOSIS — Z0289 Encounter for other administrative examinations: Secondary | ICD-10-CM

## 2020-04-07 MED ORDER — METFORMIN HCL 500 MG PO TABS: 500.0000 mg | ORAL_TABLET | Freq: Every day | ORAL | 0 refills | Status: DC

## 2020-04-11 NOTE — Progress Notes (Signed)
Chief Complaint:   OBESITY Shelby Norton (MR# [id number]) is a 39 y.o. female who presents for evaluation and treatment of obesity and related comorbidities. Current BMI is Body mass index is 33.61 kg/m. Laprecious has been struggling with her weight for many years and has been unsuccessful in either losing weight, maintaining weight loss, or reaching her healthy weight goal.  Keyaria is currently in the action stage of change and ready to dedicate time achieving and maintaining a healthier weight. Amanii is interested in becoming our patient and working on intensive lifestyle modifications including (but not limited to) diet and exercise for weight loss.  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  She works 12 hours per week.  She is married and lives with her husband and 2 children, ages 22 and 2.  She is interested in midwifery, and will be going back to school in the fall.  English's habits were reviewed today and are as follows: Her family eats meals together, she thinks her family will eat healthier with her, her desired weight loss is 84 pounds, she started gaining weight after her second pregnancy, her heaviest weight ever was 218 pounds, she craves pasta, rice, potatoes, any carbs, cookies, she snacks frequently in the evenings, she skips breakfast frequently, she frequently makes poor food choices, she frequently eats larger portions than normal and she struggles with emotional eating.  Depression Screen Maricia's Food and Mood (modified PHQ-9) score was 6.  Depression screen PHQ 2/9 04/07/2020  Decreased Interest 1  Down, Depressed, Hopeless 1  PHQ - 2 Score 2  Altered sleeping 0  Tired, decreased energy 0  Change in appetite 1  Feeling bad or failure about yourself  2  Trouble concentrating 1  Moving slowly or fidgety/restless 0  Suicidal thoughts 0  PHQ-9 Score 6  Difficult doing work/chores Somewhat difficult   Subjective:   1. Other fatigue Scout denies daytime somnolence  and reports waking up still tired. Patent has a history of symptoms of morning fatigue and morning headache. Hanley generally gets 3-7 hours of sleep per night, and states that she has poor quality sleep. Snoring is not present. Apneic episodes are not present. Epworth Sleepiness Score is 1.  2. SOB (shortness of breath) on exertion Staci notes increasing shortness of breath with exercising and seems to be worsening over time with weight gain. She notes getting out of breath sooner with activity than she used to. This has not gotten worse recently. Annastyn denies shortness of breath at rest or orthopnea.  3. Prediabetes Kati has a diagnosis of prediabetes based on her elevated HgA1c and was informed this puts her at greater risk of developing diabetes. She continues to work on diet and exercise to decrease her risk of diabetes. She denies nausea or hypoglycemia.  Lab Results  Component Value Date   HGBA1C 6.3 04/05/2020   4. Other migraine without status migrainosus, not intractable Laverle complains of occasional migraine headaches.  She takes Fioricet as a rescue medication.  5. History of eating disorder Dijon has history of bulimia and anorexia.  She has been in recovery for 15 years.  6. Vitamin D deficiency Kristie's Vitamin D level was 30.29 on 04/05/2020. She is currently taking a prenatal vitamin. She denies nausea, vomiting or muscle weakness.  7. Attention deficit disorder, unspecified hyperactivity presence She is taking Adderall 10 mg daily.  Prescribed by her PCP, Marcelline Mates.  She says she will be going back to school  soon.  8. Depression screening Aundra Millet was screened for depression as a regular part of her new patient workup today.  PHQ-9 is 6 today.  9. At risk for deficient intake of food The patient is at a higher than average risk of deficient intake of food.  Assessment/Plan:   1. Other fatigue Stormi does not feel that her weight is causing her energy to be lower than  it should be. Fatigue may be related to obesity, depression or many other causes. Labs will be ordered, and in the meanwhile, Somaly will focus on self care including making healthy food choices, increasing physical activity and focusing on stress reduction.  Orders - EKG 12-Lead  2. SOB (shortness of breath) on exertion Valborg does not feel that she gets out of breath more easily that she used to when she exercises. Suprena's shortness of breath appears to be obesity related and exercise induced. She has agreed to work on weight loss and gradually increase exercise to treat her exercise induced shortness of breath. Will continue to monitor closely.  3. Prediabetes Claribel will continue to work on weight loss, exercise, and decreasing simple carbohydrates to help decrease the risk of diabetes.   Orders - metFORMIN (GLUCOPHAGE) 500 MG tablet; Take 1 tablet (500 mg total) by mouth daily.  Dispense: 30 tablet; Refill: 0  4. Other migraine without status migrainosus, not intractable Will continue to monitor.  5. History of eating disorder Current treatment plan is effective, no change in therapy.  6. Vitamin D deficiency Low Vitamin D level contributes to fatigue and are associated with obesity, breast, and colon cancer.  7. Attention deficit disorder, unspecified hyperactivity presence Aundra Millet will continue to follow with her PCP for refills.  8. Depression screening Laporchia had a positive depression screening. Depression is commonly associated with obesity and often results in emotional eating behaviors. We will monitor this closely and work on CBT to help improve the non-hunger eating patterns. Referral to Psychology may be required if no improvement is seen as she continues in our clinic.  9. At risk for deficient intake of food Issa was given approximately 15 minutes of deficit intake of food prevention counseling today. Shandora is at risk for eating too few calories based on current food recall.  She was encouraged to focus on meeting caloric and protein goals according to her recommended meal plan.   10. Class 1 obesity with serious comorbidity and body mass index (BMI) of 33.0 to 33.9 in adult, unspecified obesity type Chanay is currently in the action stage of change and her goal is to continue with weight loss efforts. I recommend Jaelin begin the structured treatment plan as follows:  She has agreed to the Category 3 Plan.  Exercise goals: No exercise has been prescribed at this time.   Behavioral modification strategies: increasing lean protein intake, decreasing simple carbohydrates, increasing vegetables, increasing water intake, increasing high fiber foods and emotional eating strategies.  She was informed of the importance of frequent follow-up visits to maximize her success with intensive lifestyle modifications for her multiple health conditions. She was informed we would discuss her lab results at her next visit unless there is a critical issue that needs to be addressed sooner. Heela agreed to keep her next visit at the agreed upon time to discuss these results.  Aundra Millet recently had labs drawn with her PCP.  Objective:   Blood pressure 132/82, pulse 77, temperature 98.3 F (36.8 C), temperature source Oral, height 5\' 5"  (1.651 m), weight  202 lb (91.6 kg), last menstrual period 03/30/2020, SpO2 100 %. Body mass index is 33.61 kg/m.  EKG: Normal sinus rhythm, rate 82 bpm.  Indirect Calorimeter completed today shows a VO2 of 306 and a REE of 2126.  Her calculated basal metabolic rate is 6010 thus her basal metabolic rate is better than expected.  General: Cooperative, alert, well developed, in no acute distress. HEENT: Conjunctivae and lids unremarkable. Cardiovascular: Regular rhythm.  Lungs: Normal work of breathing. Neurologic: No focal deficits.   Lab Results  Component Value Date   CREATININE 0.65 04/05/2020   BUN 8 04/05/2020   NA 140 04/05/2020   K 4.1  04/05/2020   CL 103 04/05/2020   CO2 28 04/05/2020   Lab Results  Component Value Date   ALT 19 04/05/2020   AST 18 04/05/2020   ALKPHOS 96 04/05/2020   BILITOT 0.7 04/05/2020   Lab Results  Component Value Date   HGBA1C 6.3 04/05/2020   Lab Results  Component Value Date   TSH 1.42 04/05/2020   Lab Results  Component Value Date   CHOL 198 04/05/2020   HDL 65.90 04/05/2020   LDLCALC 113 (H) 04/05/2020   TRIG 98.0 04/05/2020   CHOLHDL 3 04/05/2020   Lab Results  Component Value Date   WBC 7.6 04/05/2020   HGB 13.4 04/05/2020   HCT 39.5 04/05/2020   MCV 86.1 04/05/2020   PLT 341.0 04/05/2020   Attestation Statements:   This is the patient's first visit at [facility]. The patient's NEW PATIENT PACKET was reviewed at length. Included in the packet: current and past health history, medications, allergies, ROS, gynecologic history (women only), surgical history, family history, social history, weight history, weight loss surgery history (for those that have had weight loss surgery), nutritional evaluation, mood and food questionnaire, PHQ9, Epworth questionnaire, sleep habits questionnaire, patient life and health improvement goals questionnaire. These will all be scanned into the patient's chart under media.   During the visit, I independently reviewed the patient's EKG, bioimpedance scale results, and indirect calorimeter results. I used this information to tailor a meal plan for the patient that will help her to lose weight and will improve her obesity-related conditions going forward. I performed a medically necessary appropriate examination and/or evaluation. I discussed the assessment and treatment plan with the patient. The patient was provided an opportunity to ask questions and all were answered. The patient agreed with the plan and demonstrated an understanding of the instructions. Labs were ordered at this visit and will be reviewed at the next visit unless  more critical results need to be addressed immediately. Clinical information was updated and documented in the EMR.   I, Water quality scientist, CMA, am acting as transcriptionist for Briscoe Deutscher, DO  I have reviewed the above documentation for accuracy and completeness, and I agree with the above. Briscoe Deutscher, DO

## 2020-04-21 ENCOUNTER — Other Ambulatory Visit: Payer: Self-pay

## 2020-04-21 ENCOUNTER — Ambulatory Visit (INDEPENDENT_AMBULATORY_CARE_PROVIDER_SITE_OTHER): Payer: 59 | Admitting: Family Medicine

## 2020-04-21 ENCOUNTER — Encounter (INDEPENDENT_AMBULATORY_CARE_PROVIDER_SITE_OTHER): Payer: Self-pay | Admitting: Family Medicine

## 2020-04-21 VITALS — BP 128/84 | HR 87 | Temp 98.3°F | Ht 65.0 in | Wt 199.0 lb

## 2020-04-21 DIAGNOSIS — E669 Obesity, unspecified: Secondary | ICD-10-CM

## 2020-04-21 DIAGNOSIS — Z9189 Other specified personal risk factors, not elsewhere classified: Secondary | ICD-10-CM

## 2020-04-21 DIAGNOSIS — E538 Deficiency of other specified B group vitamins: Secondary | ICD-10-CM | POA: Diagnosis not present

## 2020-04-21 DIAGNOSIS — R7303 Prediabetes: Secondary | ICD-10-CM

## 2020-04-21 DIAGNOSIS — Z6833 Body mass index (BMI) 33.0-33.9, adult: Secondary | ICD-10-CM

## 2020-04-22 LAB — VITAMIN B12: Vitamin B-12: 440 pg/mL (ref 232–1245)

## 2020-04-22 LAB — INSULIN, RANDOM: INSULIN: 4.7 u[IU]/mL (ref 2.6–24.9)

## 2020-04-27 NOTE — Progress Notes (Signed)
Chief Complaint:   OBESITY Shelby Norton is here to discuss her progress with her obesity treatment plan along with follow-up of her obesity related diagnoses. Shelby Norton is on the Category 3 Plan and states she is following her eating plan approximately 98% of the time. Shelby Norton states she is strength training and cardio for 45-60 minutes 3-4 times per week.  Today's visit was #: 2 Starting weight: 202 ls Starting date: 04/07/2020 Today's weight: 199 lbs Today's date: 04/21/2020 Total lbs lost to date: 3 lbs Total lbs lost since last in-office visit: 3 lbs  Interim History: Shelby Norton says that the plan has too much food for her.  Subjective:   1. Prediabetes Shelby Norton has a diagnosis of prediabetes based on her elevated HgA1c and was informed this puts her at greater risk of developing diabetes. She continues to work on diet and exercise to decrease her risk of diabetes. She denies nausea or hypoglycemia.  Lab Results  Component Value Date   HGBA1C 6.3 04/05/2020   Lab Results  Component Value Date   INSULIN 4.7 04/21/2020   2. Vitamin B12 deficiency She is not a vegetarian.  She does not have a previous diagnosis of pernicious anemia.  She does not have a history of weight loss surgery.   Lab Results  Component Value Date   VITAMINB12 440 04/21/2020   Assessment/Plan:   1. Prediabetes Shelby Norton will continue to work on weight loss, exercise, and decreasing simple carbohydrates to help decrease the risk of diabetes.   Orders - Insulin, random  2. Vitamin B12 deficiency The diagnosis was reviewed with the patient. Counseling provided today, see below. We will continue to monitor. Orders and follow up as documented in patient record.  Counseling . The body needs vitamin B12: to make red blood cells; to make DNA; and to help the nerves work properly so they can carry messages from the brain to the body.  . The main causes of vitamin B12 deficiency include dietary deficiency, digestive  diseases, pernicious anemia, and having a surgery in which part of the stomach or small intestine is removed.  . Certain medicines can make it harder for the body to absorb vitamin B12. These medicines include: heartburn medications; some antibiotics; some medications used to treat diabetes, gout, and high cholesterol.  . In some cases, there are no symptoms of this condition. If the condition leads to anemia or nerve damage, various symptoms can occur, such as weakness or fatigue, shortness of breath, and numbness or tingling in your hands and feet.   . Treatment:  o May include taking vitamin B12 supplements.  o Avoid alcohol.  o Eat lots of healthy foods that contain vitamin B12: - Beef, pork, chicken, [place], and organ meats, such as liver.  - Seafood: This includes clams, rainbow trout, salmon, tuna, and haddock. Eggs.  - Cereal and dairy products that are fortified: This means that vitamin B12 has been added to the food.   Orders - Vitamin B12  3. At risk for deficient intake of food Shelby Norton was given approximately 15 minutes of deficit intake of food prevention counseling today. [name] is at risk for eating too few calories based on current food recall. She was encouraged to focus on meeting caloric and protein goals according to her recommended meal plan.   4. Class 1 obesity with serious comorbidity and body mass index (BMI) of 33.0 to 33.9 in adult, unspecified obesity type Shelby Norton is currently in the action stage of change. As  such, her goal is to continue with weight loss efforts. She has agreed to keeping a food journal and adhering to recommended goals of 1500 calories and 100 grams of protein.   Exercise goals: For substantial health benefits, adults should do at least 150 minutes (2 hours and 30 minutes) a week of moderate-intensity, or 75 minutes (1 hour and 15 minutes) a week of vigorous-intensity aerobic physical activity, or an equivalent combination of moderate- and  vigorous-intensity aerobic activity. Aerobic activity should be performed in episodes of at least 10 minutes, and preferably, it should be spread throughout the week.  Behavioral modification strategies: increasing lean protein intake, increasing water intake and planning for success.  Shelby Norton has agreed to follow-up with our clinic in 2-3 weeks. She was informed of the importance of frequent follow-up visits to maximize her success with intensive lifestyle modifications for her multiple health conditions.   Shelby Norton was informed we would discuss her lab results at her next visit unless there is a critical issue that needs to be addressed sooner. Shelby Norton agreed to keep her next visit at the agreed upon time to discuss these results.  Objective:   Blood pressure 128/84, pulse 87, temperature 98.3 F (36.8 C), temperature source Oral, height 5\' 5"  (1.651 m), weight 199 lb (90.3 kg), last menstrual period 03/30/2020, SpO2 98 %. Body mass index is 33.12 kg/m.  General: Cooperative, alert, well developed, in no acute distress. HEENT: Conjunctivae and lids unremarkable. Cardiovascular: Regular rhythm.  Lungs: Normal work of breathing. Neurologic: No focal deficits.   Lab Results  Component Value Date   CREATININE 0.65 04/05/2020   BUN 8 04/05/2020   NA 140 04/05/2020   K 4.1 04/05/2020   CL 103 04/05/2020   CO2 28 04/05/2020   Lab Results  Component Value Date   ALT 19 04/05/2020   AST 18 04/05/2020   ALKPHOS 96 04/05/2020   BILITOT 0.7 04/05/2020   Lab Results  Component Value Date   HGBA1C 6.3 04/05/2020   Lab Results  Component Value Date   INSULIN 4.7 04/21/2020   Lab Results  Component Value Date   TSH 1.42 04/05/2020   Lab Results  Component Value Date   CHOL 198 04/05/2020   HDL 65.90 04/05/2020   LDLCALC 113 (H) 04/05/2020   TRIG 98.0 04/05/2020   CHOLHDL 3 04/05/2020   Lab Results  Component Value Date   WBC 7.6 04/05/2020   HGB 13.4 04/05/2020   HCT 39.5  04/05/2020   MCV 86.1 04/05/2020   PLT 341.0 04/05/2020   Attestation Statements:   Reviewed by clinician on day of visit: allergies, medications, problem list, medical history, surgical history, family history, social history, and previous encounter notes.  I, 04/07/2020, CMA, am acting as transcriptionist for Insurance claims handler, DO  I have reviewed the above documentation for accuracy and completeness, and I agree with the above. Helane Rima, DO

## 2020-05-03 ENCOUNTER — Telehealth (INDEPENDENT_AMBULATORY_CARE_PROVIDER_SITE_OTHER): Payer: 59 | Admitting: Physician Assistant

## 2020-05-03 ENCOUNTER — Encounter: Payer: Self-pay | Admitting: Physician Assistant

## 2020-05-03 ENCOUNTER — Other Ambulatory Visit: Payer: Self-pay

## 2020-05-03 DIAGNOSIS — G43109 Migraine with aura, not intractable, without status migrainosus: Secondary | ICD-10-CM

## 2020-05-03 DIAGNOSIS — F988 Other specified behavioral and emotional disorders with onset usually occurring in childhood and adolescence: Secondary | ICD-10-CM

## 2020-05-03 NOTE — Patient Instructions (Signed)
Instructions sent to MyChart

## 2020-05-03 NOTE — Progress Notes (Signed)
I have discussed the procedure for the virtual visit with the patient who has given consent to proceed with assessment and treatment.   Jessicamarie Amiri S Aira Sallade, CMA     

## 2020-05-03 NOTE — Progress Notes (Signed)
Virtual Visit via Video   I connected with patient on 05/03/20 at  8:00 AM EDT by a video enabled telemedicine application and verified that I am speaking with the correct person using two identifiers.  Location patient: Home Location provider: [facility], [facility] Persons participating in the virtual visit: Patient, Provider, CMA (Patina Moore)  I discussed the limitations of evaluation and management by telemedicine and the availability of in person appointments. The patient expressed understanding and agreed to proceed.  Subjective:   HPI:   Patient presents via [facility] today for follow-up regarding ADD and chronic migraines.   ADD -- Patient was started on Adderall XR 10 mg once daily at last visit. Endorses taking as directed and has been tolerating well without any noted side effects.  Notes significant improvement focus.  Denies any change to appetite.  Continues to sleep well.   Migraines --patient started on Topamax 25 mg daily with intent to increase to 50 mg daily after 2 weeks.  Patient endorses taking the 25 mg as directed and tolerating well overall.  Notes she did note some fatigue initially but as she has adjusted to the medication the symptoms have dissipated.  Notes on the 25 mg her headaches have completely resolved.  As such she has not increased to 50 mg.   ROS:   See pertinent positives and negatives per HPI.  Patient Active Problem List   Diagnosis Date Noted  . History of nephrolithiasis 04/05/2020  . Pelvic pain 12/22/2017  . SVD (spontaneous vaginal delivery) / [name] 11/21 09/12/2017  . 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 S/P ACL reconstruction 01/04/2016  . Raynaud's phenomenon 03/16/2015  . Sjogren's syndrome (HCC) 03/16/2015  . Mitral insufficiency and aortic stenosis 08/20/2014  . Migraine 05/25/2014  . Rhabdomyolysis 01/08/2014  . Mild intermittent asthma without complication 10/02/2013  . Allergic  rhinitis 01/01/2013    Social History   Tobacco Use  . Smoking status: Never Smoker  . Smokeless tobacco: Never Used  Substance Use Topics  . Alcohol use: Yes    Comment: occ    Current Outpatient Medications:  .  albuterol (PROVENTIL HFA;VENTOLIN HFA) 108 (90 Base) MCG/ACT inhaler, Inhale 1-2 puffs into the lungs every 6 (six) hours as needed for wheezing or shortness of breath., Disp: , Rfl:  .  ALFALFA PO, Take 1,000 mg by mouth daily. , Disp: , Rfl:  .  amphetamine-dextroamphetamine (ADDERALL XR) 10 MG 24 hr capsule, Take 1 capsule (10 mg total) by mouth daily., Disp: 30 capsule, Rfl: 0 .  butalbital-acetaminophen-caffeine (FIORICET) 50-325-40 MG tablet, Take 1 tablet by mouth every 6 (six) hours as needed for headache., Disp: 5 tablet, Rfl: 0 .  cetirizine (ZYRTEC) 10 MG tablet, Take 10 mg by mouth daily., Disp: , Rfl:  .  EPINEPHrine (EPIPEN 2-PAK) 0.3 mg/0.3 mL IJ SOAJ injection, Inject 0.3 mg into the skin daily as needed. For allergic reaction, Disp: , Rfl:  .  famotidine (PEPCID) 20 MG tablet, Take 20 mg by mouth as needed., Disp: , Rfl:  .  MAGNESIUM MALATE PO, Take 1,000 mg by mouth daily., Disp: , Rfl:  .  metFORMIN (GLUCOPHAGE) 500 MG tablet, Take 1 tablet (500 mg total) by mouth daily., Disp: 30 tablet, Rfl: 0 .  Prenatal Vit-Fe Fumarate-FA (PRENATAL MULTIVITAMIN) TABS tablet, Take 1 tablet by mouth daily at 12 noon., Disp: , Rfl:  .  Probiotic Product (PROBIOTIC PO), Take 2 capsules by mouth daily., Disp: , Rfl:  .  topiramate (TOPAMAX)  25 MG tablet, Take 1 tablet (25 mg total) by mouth daily., Disp: 30 tablet, Rfl: 1  Allergies  Allergen Reactions  . Aspirin Hives  . Other Anaphylaxis    hazelnuts  . Codeine Nausea And Vomiting and Swelling  . Hydrocodone Nausea And Vomiting and Swelling  . Betamethasone Rash    On face and neck    Objective:   There were no vitals taken for this visit.  Patient is well-developed, well-nourished in no acute distress.   Resting comfortably at home.  Head is normocephalic, atraumatic.  No labored breathing.  Speech is clear and coherent with logical content.  Patient is alert and oriented at baseline.   Assessment and Plan:   1. Migraine with aura and without status migrainosus, not intractable Doing very well on Topamax 25 mg daily.  We will continue current regimen.  Continue supportive measures.  Follow-up 3 months.  Sooner if needed.  2. Attention deficit disorder, unspecified hyperactivity presence Doing very well overall and Adderall XR 10 mg daily.  Continue current regimen.  Follow-up in 3 months.  Will obtain UDS at that time.    Piedad Climes, PA-C 05/03/2020

## 2020-05-04 ENCOUNTER — Encounter (INDEPENDENT_AMBULATORY_CARE_PROVIDER_SITE_OTHER): Payer: Self-pay | Admitting: Adult Health

## 2020-05-04 ENCOUNTER — Ambulatory Visit (INDEPENDENT_AMBULATORY_CARE_PROVIDER_SITE_OTHER): Payer: 59 | Admitting: Adult Health

## 2020-05-04 VITALS — BP 125/84 | HR 91 | Temp 97.7°F | Ht 65.0 in | Wt 200.0 lb

## 2020-05-04 DIAGNOSIS — F439 Reaction to severe stress, unspecified: Secondary | ICD-10-CM

## 2020-05-04 DIAGNOSIS — Z6833 Body mass index (BMI) 33.0-33.9, adult: Secondary | ICD-10-CM

## 2020-05-04 DIAGNOSIS — Z9189 Other specified personal risk factors, not elsewhere classified: Secondary | ICD-10-CM | POA: Diagnosis not present

## 2020-05-04 DIAGNOSIS — G43809 Other migraine, not intractable, without status migrainosus: Secondary | ICD-10-CM

## 2020-05-04 DIAGNOSIS — R7303 Prediabetes: Secondary | ICD-10-CM | POA: Diagnosis not present

## 2020-05-04 DIAGNOSIS — E669 Obesity, unspecified: Secondary | ICD-10-CM

## 2020-05-04 MED ORDER — METFORMIN HCL 500 MG PO TABS: 500.0000 mg | ORAL_TABLET | Freq: Every day | ORAL | 0 refills | Status: DC

## 2020-05-05 DIAGNOSIS — F439 Reaction to severe stress, unspecified: Secondary | ICD-10-CM | POA: Insufficient documentation

## 2020-05-05 DIAGNOSIS — Z6831 Body mass index (BMI) 31.0-31.9, adult: Secondary | ICD-10-CM | POA: Insufficient documentation

## 2020-05-05 DIAGNOSIS — E669 Obesity, unspecified: Secondary | ICD-10-CM | POA: Insufficient documentation

## 2020-05-05 DIAGNOSIS — R7303 Prediabetes: Secondary | ICD-10-CM | POA: Insufficient documentation

## 2020-05-05 NOTE — Progress Notes (Signed)
Chief Complaint:   OBESITY Shelby Norton is here to discuss her progress with her obesity treatment plan along with follow-up of her obesity related diagnoses. Shelby Norton is keeping a food journal and adhering to recommended goals of 1500 calories and 100+ grams of protein and states she is following her eating plan approximately 98% of the time. Shelby Norton states she is weight training/cardio 30-55 minutes 3 times per week.  Today's visit was #: 3 Starting weight: 202 lbs Starting date: 04/07/2020 Today's weight: 200 lbs Today's date: 05/04/2020 Total lbs lost to date: 2 Total lbs lost since last in-office visit: 0  Interim History: Shelby Norton estimates to be hitting calorie/protein goal 98% and tracking intake 100% of the time. She is trying to snack on higher protein foods and remain well hydrated. She recently stopped breastfeeding her last child.  Subjective:   Prediabetes. Aralyn has a diagnosis of prediabetes based on her elevated HgA1c and was informed this puts her at greater risk of developing diabetes. She continues to work on diet and exercise to decrease her risk of diabetes. She denies nausea or hypoglycemia. Shelby Norton is on metformin and denies GI upset.  Lab Results  Component Value Date   HGBA1C 6.3 04/05/2020   Lab Results  Component Value Date   INSULIN 4.7 04/21/2020   Other migraine without status migrainosus, not intractable. Shelby Norton recently resumed topiramate 25 mg daily and reports a dramatic reduction in her migraine symptoms.  Stress. Shelby Norton's stress is related to the health of her youngest child. She recently stopped breastfeeding and is still monitoring her daughter throughout the night, which prevents her from proper/adequate sleep. She reports having an excellent support system. She is not on an anti-depressant.  She is agreeable to referral to Behavioral Health/Psychology.  She denies SI/HI.  At risk for depression. Shelby Norton is at elevated risk of depression due to  increased stress related to caring for her youngest daughter. She will be referred to [facility].  Assessment/Plan:   Prediabetes. Shelby Norton will continue to work on weight loss, exercise, and decreasing simple carbohydrates to help decrease the risk of diabetes. Refill was given for metFORMIN (GLUCOPHAGE) 500 MG tablet daily #30 with 0 refills. Labs will be checked every 3 months.  Other migraine without status migrainosus, not intractable. Shelby Norton will continue topiramate as directed and will remain well hydrated.  Stress.  Ambulatory referral to Psychology.  At risk for depression. Shelby Norton was given approximately 15 minutes of depression risk counseling today. She has risk factors for depression including increased stress related to caring for her youngest daughter. We discussed the importance of a healthy work life balance, a healthy relationship with food and a good support system.  Repetitive spaced learning was employed today to elicit superior memory formation and behavioral change.  Class 1 obesity with serious comorbidity and body mass index (BMI) of 33.0 to 33.9 in adult, unspecified obesity type.  Shelby Norton is currently in the action stage of change. As such, her goal is to continue with weight loss efforts. She has agreed to keeping a food journal and adhering to recommended goals of 1500 calories and 100+ grams of protein daily.   Exercise goals: Shelby Norton will continue her current exercise regimen.   Behavioral modification strategies: increasing lean protein intake, meal planning and cooking strategies, holiday eating strategies , planning for success and keeping a strict food journal.  Shelby Norton has agreed to follow-up with our clinic in 2 weeks. She was informed of the importance of  frequent follow-up visits to maximize her success with intensive lifestyle modifications for her multiple health conditions.   Objective:   Blood pressure 125/84, pulse 91, temperature 97.7 F (36.5 C),  temperature source Oral, height 5\' 5"  (1.651 m), weight 200 lb (90.7 kg), SpO2 100 %. Body mass index is 33.28 kg/m.  General: Cooperative, alert, well developed, in no acute distress. HEENT: Conjunctivae and lids unremarkable. Cardiovascular: Regular rhythm.  Lungs: Normal work of breathing. Neurologic: No focal deficits.   Lab Results  Component Value Date   CREATININE 0.65 04/05/2020   BUN 8 04/05/2020   NA 140 04/05/2020   K 4.1 04/05/2020   CL 103 04/05/2020   CO2 28 04/05/2020   Lab Results  Component Value Date   ALT 19 04/05/2020   AST 18 04/05/2020   ALKPHOS 96 04/05/2020   BILITOT 0.7 04/05/2020   Lab Results  Component Value Date   HGBA1C 6.3 04/05/2020   Lab Results  Component Value Date   INSULIN 4.7 04/21/2020   Lab Results  Component Value Date   TSH 1.42 04/05/2020   Lab Results  Component Value Date   CHOL 198 04/05/2020   HDL 65.90 04/05/2020   LDLCALC 113 (H) 04/05/2020   TRIG 98.0 04/05/2020   CHOLHDL 3 04/05/2020   Lab Results  Component Value Date   WBC 7.6 04/05/2020   HGB 13.4 04/05/2020   HCT 39.5 04/05/2020   MCV 86.1 04/05/2020   PLT 341.0 04/05/2020   No results found for: IRON, TIBC, FERRITIN  Attestation Statements:   Reviewed by clinician on day of visit: allergies, medications, problem list, medical history, surgical history, family history, social history, and previous encounter notes.  I, 04/07/2020, am acting as Shelby Norton Payment for Energy manager, NP-C   I have reviewed the above documentation for accuracy and completeness, and I agree with the above. -  [employer], NP

## 2020-05-09 ENCOUNTER — Other Ambulatory Visit: Payer: Self-pay | Admitting: Physician Assistant

## 2020-05-09 DIAGNOSIS — G43809 Other migraine, not intractable, without status migrainosus: Secondary | ICD-10-CM

## 2020-05-09 DIAGNOSIS — F988 Other specified behavioral and emotional disorders with onset usually occurring in childhood and adolescence: Secondary | ICD-10-CM

## 2020-05-09 MED ORDER — AMPHETAMINE-DEXTROAMPHET ER 10 MG PO CP24: 10.0000 mg | ORAL_CAPSULE | Freq: Every day | ORAL | 0 refills | Status: DC

## 2020-05-09 MED ORDER — TOPIRAMATE 25 MG PO TABS: 25.0000 mg | ORAL_TABLET | Freq: Every day | ORAL | 1 refills | Status: DC

## 2020-05-09 NOTE — Telephone Encounter (Signed)
Pt called in asking asking for a new script of the adderall xr to be sent the [facility] on [address] she is out of this medication.   She was able to pick up a 30 day supply of the Topiramate but moving forward she would like a 90 day supply.   Please advise.   Pt can be reached at the home #

## 2020-05-09 NOTE — Telephone Encounter (Signed)
Patient has more questions about the rx's - she thought she could get one of them but the pharmacy does not have the correct one so she has gotten no prescriptions.  Please call patient.

## 2020-05-09 NOTE — Telephone Encounter (Signed)
Left message on VM that both medications were sent to the pharmacy.

## 2020-05-09 NOTE — Telephone Encounter (Signed)
Adderall last rx 04/05/20 #30 LOV: 05/03/20 Migraine and ADD-video No CSC on file

## 2020-05-19 ENCOUNTER — Ambulatory Visit (INDEPENDENT_AMBULATORY_CARE_PROVIDER_SITE_OTHER): Payer: 59 | Admitting: Physician Assistant

## 2020-05-19 ENCOUNTER — Other Ambulatory Visit: Payer: Self-pay

## 2020-05-19 ENCOUNTER — Encounter (INDEPENDENT_AMBULATORY_CARE_PROVIDER_SITE_OTHER): Payer: Self-pay | Admitting: Physician Assistant

## 2020-05-19 VITALS — BP 129/85 | HR 90 | Temp 98.0°F | Ht 65.0 in | Wt 195.0 lb

## 2020-05-19 DIAGNOSIS — Z9189 Other specified personal risk factors, not elsewhere classified: Secondary | ICD-10-CM

## 2020-05-19 DIAGNOSIS — E559 Vitamin D deficiency, unspecified: Secondary | ICD-10-CM

## 2020-05-19 DIAGNOSIS — E669 Obesity, unspecified: Secondary | ICD-10-CM | POA: Diagnosis not present

## 2020-05-19 DIAGNOSIS — R7303 Prediabetes: Secondary | ICD-10-CM

## 2020-05-19 DIAGNOSIS — Z6832 Body mass index (BMI) 32.0-32.9, adult: Secondary | ICD-10-CM

## 2020-05-19 MED ORDER — VITAMIN D (ERGOCALCIFEROL) 1.25 MG (50000 UNIT) PO CAPS: 50000.0000 [IU] | ORAL_CAPSULE | ORAL | 0 refills | Status: DC

## 2020-05-23 ENCOUNTER — Encounter (INDEPENDENT_AMBULATORY_CARE_PROVIDER_SITE_OTHER): Payer: Self-pay

## 2020-05-23 NOTE — Progress Notes (Signed)
Chief Complaint:   OBESITY Shelby Norton is here to discuss her progress with her obesity treatment plan along with follow-up of her obesity related diagnoses. Shelby Norton is keeping a food journal and adhering to recommended goals of 1500 calories and 100+ grams of protein and states she is following her eating plan approximately 75% of the time. Shelby Norton states she is working with a trainer 30-45 minutes 3 times per week and doing cardio 25-30 minutes 1 time per week.  Today's visit was #: 4 Starting weight: 202 lbs Starting date: 04/07/2020 Today's weight: 195 lbs Today's date: 05/19/2020 Total lbs lost to date: 7 Total lbs lost since last in-office visit: 5  Interim History: Shelby Norton is shocked with weight loss today due to feeling like the last 2 weeks she has been off of plan more than normal. She is helping take care of her father who is sick.  Subjective:   Prediabetes. Shelby Norton has a diagnosis of prediabetes based on her elevated HgA1c and was informed this puts her at greater risk of developing diabetes. She continues to work on diet and exercise to decrease her risk of diabetes. Shelby Norton is on metformin. No nausea, vomiting, or diarrhea.   Lab Results  Component Value Date   HGBA1C 6.3 04/05/2020   Lab Results  Component Value Date   INSULIN 4.7 04/21/2020   Vitamin D deficiency. Shelby Norton is only on prenatal vitamins.   Ref. Range 04/05/2020 14:06  VITD Latest Ref Range: 30.00 - 100.00 ng/mL 30.29   At risk for osteoporosis. Shelby Norton is at higher risk of osteopenia and osteoporosis due to Vitamin D deficiency.   Assessment/Plan:   Prediabetes. Shelby Norton will continue to work on weight loss, exercise, and decreasing simple carbohydrates to help decrease the risk of diabetes. She will continue her medication as directed.   Vitamin D deficiency. Low Vitamin D level contributes to fatigue and are associated with obesity, breast, and colon cancer. She will start Vitamin D, Ergocalciferol,  (DRISDOL) 1.25 MG (50000 UNIT) CAPS capsule every week #4 with 0 refills and will follow-up for routine testing of Vitamin D, at least 2-3 times per year to avoid over-replacement.   At risk for osteoporosis. Shelby Norton was given approximately 15 minutes of osteoporosis prevention counseling today. Shelby Norton is at risk for osteopenia and osteoporosis due to her Vitamin D deficiency. She was encouraged to take her Vitamin D and follow her higher calcium diet and increase strengthening exercise to help strengthen her bones and decrease her risk of osteopenia and osteoporosis.  Repetitive spaced learning was employed today to elicit superior memory formation and behavioral change.  Class 1 obesity with serious comorbidity and body mass index (BMI) of 32.0 to 32.9 in adult, unspecified obesity type.  Shelby Norton is currently in the action stage of change. As such, her goal is to continue with weight loss efforts. She has agreed to practicing portion control and making smarter food choices, such as increasing vegetables and decreasing simple carbohydrates and will journal 1600-1700 calories and 115 grams of protein.   Exercise goals: For substantial health benefits, adults should do at least 150 minutes (2 hours and 30 minutes) a week of moderate-intensity, or 75 minutes (1 hour and 15 minutes) a week of vigorous-intensity aerobic physical activity, or an equivalent combination of moderate- and vigorous-intensity aerobic activity. Aerobic activity should be performed in episodes of at least 10 minutes, and preferably, it should be spread throughout the week.  Behavioral modification strategies: meal planning and cooking  strategies and keeping healthy foods in the home.  Shelby Norton has agreed to follow-up with our clinic in 2 weeks. She was informed of the importance of frequent follow-up visits to maximize her success with intensive lifestyle modifications for her multiple health conditions.   Objective:   Blood pressure  (!) 129/85, pulse 90, temperature 98 F (36.7 C), temperature source Oral, height 5\' 5"  (1.651 m), weight 195 lb (88.5 kg), last menstrual period 04/27/2020, SpO2 100 %. Body mass index is 32.45 kg/m.  General: Cooperative, alert, well developed, in no acute distress. HEENT: Conjunctivae and lids unremarkable. Cardiovascular: Regular rhythm.  Lungs: Normal work of breathing. Neurologic: No focal deficits.   Lab Results  Component Value Date   CREATININE 0.65 04/05/2020   BUN 8 04/05/2020   NA 140 04/05/2020   K 4.1 04/05/2020   CL 103 04/05/2020   CO2 28 04/05/2020   Lab Results  Component Value Date   ALT 19 04/05/2020   AST 18 04/05/2020   ALKPHOS 96 04/05/2020   BILITOT 0.7 04/05/2020   Lab Results  Component Value Date   HGBA1C 6.3 04/05/2020   Lab Results  Component Value Date   INSULIN 4.7 04/21/2020   Lab Results  Component Value Date   TSH 1.42 04/05/2020   Lab Results  Component Value Date   CHOL 198 04/05/2020   HDL 65.90 04/05/2020   LDLCALC 113 (H) 04/05/2020   TRIG 98.0 04/05/2020   CHOLHDL 3 04/05/2020   Lab Results  Component Value Date   WBC 7.6 04/05/2020   HGB 13.4 04/05/2020   HCT 39.5 04/05/2020   MCV 86.1 04/05/2020   PLT 341.0 04/05/2020   No results found for: IRON, TIBC, FERRITIN  Attestation Statements:   Reviewed by clinician on day of visit: allergies, medications, problem list, medical history, surgical history, family history, social history, and previous encounter notes.  I06/17/2021, am acting as transcriptionist for Marianna Payment, PA-C   I have reviewed the above documentation for accuracy and completeness, and I agree with the above. Alois Cliche, PA-C

## 2020-06-02 ENCOUNTER — Ambulatory Visit (INDEPENDENT_AMBULATORY_CARE_PROVIDER_SITE_OTHER): Payer: 59 | Admitting: Adult Health

## 2020-06-05 ENCOUNTER — Other Ambulatory Visit: Payer: Self-pay

## 2020-06-05 ENCOUNTER — Emergency Department (HOSPITAL_COMMUNITY): Payer: 59

## 2020-06-05 ENCOUNTER — Encounter (HOSPITAL_COMMUNITY): Payer: Self-pay | Admitting: Emergency Medicine

## 2020-06-05 ENCOUNTER — Emergency Department (HOSPITAL_COMMUNITY)
Admission: EM | Admit: 2020-06-05 | Discharge: 2020-06-05 | Disposition: A | Discharge status: Home / Self Care | Payer: 59 | Attending: Emergency Medicine | Admitting: Emergency Medicine

## 2020-06-05 DIAGNOSIS — Z5321 Procedure and treatment not carried out due to patient leaving prior to being seen by health care provider: Secondary | ICD-10-CM | POA: Diagnosis not present

## 2020-06-05 DIAGNOSIS — R202 Paresthesia of skin: Secondary | ICD-10-CM | POA: Diagnosis not present

## 2020-06-05 DIAGNOSIS — R42 Dizziness and giddiness: Secondary | ICD-10-CM | POA: Insufficient documentation

## 2020-06-05 DIAGNOSIS — R0789 Other chest pain: Secondary | ICD-10-CM | POA: Insufficient documentation

## 2020-06-05 DIAGNOSIS — R110 Nausea: Secondary | ICD-10-CM | POA: Diagnosis not present

## 2020-06-05 LAB — CBC
HCT: 43.6 % (ref 36.0–46.0)
Hemoglobin: 14.2 g/dL (ref 12.0–15.0)
MCH: 28.6 pg (ref 26.0–34.0)
MCHC: 32.6 g/dL (ref 30.0–36.0)
MCV: 87.9 fL (ref 80.0–100.0)
Platelets: 317 10*3/uL (ref 150–400)
RBC: 4.96 MIL/uL (ref 3.87–5.11)
RDW: 11.9 % (ref 11.5–15.5)
WBC: 9.2 10*3/uL (ref 4.0–10.5)
nRBC: 0 % (ref 0.0–0.2)

## 2020-06-05 LAB — BASIC METABOLIC PANEL
Anion gap: 11 (ref 5–15)
BUN: 7 mg/dL (ref 6–20)
CO2: 23 mmol/L (ref 22–32)
Calcium: 9.7 mg/dL (ref 8.9–10.3)
Chloride: 106 mmol/L (ref 98–111)
Creatinine, Ser: 0.92 mg/dL (ref 0.44–1.00)
GFR calc Af Amer: >60 mL/min (ref >60)
GFR calc non Af Amer: >60 mL/min (ref >60)
Glucose, Bld: 131 mg/dL — ABNORMAL HIGH (ref 70–99)
Potassium: 3.4 mmol/L — ABNORMAL LOW (ref 3.5–5.1)
Sodium: 140 mmol/L (ref 135–145)

## 2020-06-05 LAB — TROPONIN I (HIGH SENSITIVITY)
Troponin I (High Sensitivity): 3 ng/L (ref <18)
Troponin I (High Sensitivity): 3 ng/L (ref <18)

## 2020-06-05 LAB — I-STAT BETA HCG BLOOD, ED (MC, WL, AP ONLY): I-stat hCG, quantitative: <5 m[IU]/mL (ref <5)

## 2020-06-05 NOTE — ED Triage Notes (Signed)
Pt presents to ED POV. Pt c/o chest "fullness" and bilateral UE numbness that began 0200. The sensations have been intermittent since the began until triage pt states they are now consistent. Pt also c/o nausea and light headedness. Hx of mitral valve prolapse.

## 2020-06-05 NOTE — ED Notes (Signed)
Pt stated at 08:20 am that she was leaving to go to her primary care physician.

## 2020-06-08 ENCOUNTER — Other Ambulatory Visit (INDEPENDENT_AMBULATORY_CARE_PROVIDER_SITE_OTHER): Payer: Self-pay | Admitting: Adult Health

## 2020-06-08 DIAGNOSIS — R7303 Prediabetes: Secondary | ICD-10-CM

## 2020-06-15 ENCOUNTER — Encounter (INDEPENDENT_AMBULATORY_CARE_PROVIDER_SITE_OTHER): Payer: Self-pay | Admitting: Physician Assistant

## 2020-06-15 ENCOUNTER — Other Ambulatory Visit: Payer: Self-pay | Admitting: Physician Assistant

## 2020-06-15 ENCOUNTER — Encounter: Payer: Self-pay | Admitting: Physician Assistant

## 2020-06-15 DIAGNOSIS — F988 Other specified behavioral and emotional disorders with onset usually occurring in childhood and adolescence: Secondary | ICD-10-CM

## 2020-06-15 DIAGNOSIS — R7303 Prediabetes: Secondary | ICD-10-CM

## 2020-06-15 MED ORDER — AMPHETAMINE-DEXTROAMPHET ER 10 MG PO CP24: 10.0000 mg | ORAL_CAPSULE | Freq: Every day | ORAL | 0 refills | Status: DC

## 2020-06-15 NOTE — Telephone Encounter (Signed)
Adderall LFD 05/09/20 #30 with no refills LOV 05/03/20 NOV none

## 2020-06-16 ENCOUNTER — Ambulatory Visit (INDEPENDENT_AMBULATORY_CARE_PROVIDER_SITE_OTHER): Payer: 59 | Admitting: Adult Health

## 2020-06-16 ENCOUNTER — Other Ambulatory Visit: Payer: Self-pay

## 2020-06-16 ENCOUNTER — Encounter (INDEPENDENT_AMBULATORY_CARE_PROVIDER_SITE_OTHER): Payer: Self-pay | Admitting: Adult Health

## 2020-06-16 VITALS — BP 140/86 | HR 86 | Temp 98.3°F | Ht 65.0 in | Wt 188.0 lb

## 2020-06-16 DIAGNOSIS — Z9189 Other specified personal risk factors, not elsewhere classified: Secondary | ICD-10-CM

## 2020-06-16 DIAGNOSIS — R7303 Prediabetes: Secondary | ICD-10-CM

## 2020-06-16 DIAGNOSIS — Z6831 Body mass index (BMI) 31.0-31.9, adult: Secondary | ICD-10-CM

## 2020-06-16 DIAGNOSIS — E559 Vitamin D deficiency, unspecified: Secondary | ICD-10-CM

## 2020-06-16 DIAGNOSIS — E669 Obesity, unspecified: Secondary | ICD-10-CM

## 2020-06-16 MED ORDER — METFORMIN HCL 500 MG PO TABS: 500.0000 mg | ORAL_TABLET | Freq: Every day | ORAL | 0 refills | Status: DC

## 2020-06-16 MED ORDER — VITAMIN D (ERGOCALCIFEROL) 1.25 MG (50000 UNIT) PO CAPS: 50000.0000 [IU] | ORAL_CAPSULE | ORAL | 0 refills | Status: DC

## 2020-06-20 NOTE — Progress Notes (Signed)
Chief Complaint:   OBESITY Shelby Norton is here to discuss her progress with her obesity treatment plan along with follow-up of her obesity related diagnoses. Shelby Norton is keeping a food journal and adhering to recommended goals of 1600-1700 calories and 115 grams of protein and states she is following her eating plan approximately 0% of the time. Shelby Norton states she is exercising 0 minutes 0 times per week.  Today's visit was #: 5 Starting weight: 202 lbs Starting date: 04/07/2020 Today's weight: 188 lbs Today's date: 06/16/2020 Total lbs lost to date: 14 Total lbs lost since last in-office visit: 7  Interim History: Shelby Norton's 73 year old father passed away from complications of several chronic conditions. She has had a poor appetite since his death and dealing with grief and finalizing estate. She reports having an excellent support system. She experienced an acute panic attack 06/05/2020 and was evaluated at a local ED. Cardiac workup was negative. Thoughts and prayers are with you and your family.  Subjective:   Prediabetes. Shelby Norton has a diagnosis of prediabetes based on her elevated HgA1c and was informed this puts her at greater risk of developing diabetes. She continues to work on diet and exercise to decrease her risk of diabetes. She denies nausea or hypoglycemia. Shelby Norton is on metformin 500 mg daily and denies GI upset. BMP 06/05/2020 was stable. She denies polyphagia.  Lab Results  Component Value Date   HGBA1C 6.3 04/05/2020   Lab Results  Component Value Date   INSULIN 4.7 04/21/2020   Vitamin D deficiency. Shelby Norton is on Ergocalciferol. No nausea, vomiting, or muscle weakness.,    Ref. Range 04/05/2020 14:06  VITD Latest Ref Range: 30.00 - 100.00 ng/mL 30.29   At risk for depression. Shelby Norton is at elevated risk of depression due to the recent death of her father.  Assessment/Plan:   Prediabetes. Shelby Norton will continue to work on weight loss, exercise, and decreasing simple  carbohydrates to help decrease the risk of diabetes. Refill was given for metFORMIN (GLUCOPHAGE) 500 MG tablet daily #30 with 0 refills. Labs will be checked every 3 months.  Vitamin D deficiency. Low Vitamin D level contributes to fatigue and are associated with obesity, breast, and colon cancer. She was given a refill on her Vitamin D, Ergocalciferol, (DRISDOL) 1.25 MG (50000 UNIT) CAPS capsule every week #4 with 0 refills and will follow-up for routine testing of Vitamin D every 3 months.   At risk for depression. Shelby Norton was given approximately 15 minutes of depression risk counseling today. She has risk factors for depression including recent death of her father. We discussed the importance of a healthy work life balance, a healthy relationship with food and a good support system.  Repetitive spaced learning was employed today to elicit superior memory formation and behavioral change.  Class 1 obesity with serious comorbidity and body mass index (BMI) of 31.0 to 31.9 in adult, unspecified obesity type.  Shelby Norton is currently in the action stage of change. As such, her goal is to continue with weight loss efforts. She has agreed to keeping a food journal and adhering to recommended goals of 1600-1700 calories and 115 grams of protein daily.   Exercise goals: No exercise has been prescribed at this time.  Behavioral modification strategies: increasing lean protein intake, increasing water intake, no skipping meals, meal planning and cooking strategies, planning for success and keeping a strict food journal.  Shelby Norton has agreed to follow-up with our clinic in 2 weeks. She was informed of  the importance of frequent follow-up visits to maximize her success with intensive lifestyle modifications for her multiple health conditions.   Objective:   Blood pressure 140/86, pulse 86, temperature 98.3 F (36.8 C), height 5\' 5"  (1.651 m), weight 188 lb (85.3 kg), SpO2 100 %. Body mass index is 31.28  kg/m.  General: Cooperative, alert, well developed, in no acute distress. HEENT: Conjunctivae and lids unremarkable. Cardiovascular: Regular rhythm.  Lungs: Normal work of breathing. Neurologic: No focal deficits.   Lab Results  Component Value Date   CREATININE 0.92 06/05/2020   BUN 7 06/05/2020   NA 140 06/05/2020   K 3.4 (L) 06/05/2020   CL 106 06/05/2020   CO2 23 06/05/2020   Lab Results  Component Value Date   ALT 19 04/05/2020   AST 18 04/05/2020   ALKPHOS 96 04/05/2020   BILITOT 0.7 04/05/2020   Lab Results  Component Value Date   HGBA1C 6.3 04/05/2020   Lab Results  Component Value Date   INSULIN 4.7 04/21/2020   Lab Results  Component Value Date   TSH 1.42 04/05/2020   Lab Results  Component Value Date   CHOL 198 04/05/2020   HDL 65.90 04/05/2020   LDLCALC 113 (H) 04/05/2020   TRIG 98.0 04/05/2020   CHOLHDL 3 04/05/2020   Lab Results  Component Value Date   WBC 9.2 06/05/2020   HGB 14.2 06/05/2020   HCT 43.6 06/05/2020   MCV 87.9 06/05/2020   PLT 317 06/05/2020   No results found for: IRON, TIBC, FERRITIN  Attestation Statements:   Reviewed by clinician on day of visit: allergies, medications, problem list, medical history, surgical history, family history, social history, and previous encounter notes.  I, 06/07/2020, am acting as [employer] manager, NP-C   I have reviewed the above documentation for accuracy and completeness, and I agree with the above. -  [employer], NP

## 2020-06-23 ENCOUNTER — Encounter (INDEPENDENT_AMBULATORY_CARE_PROVIDER_SITE_OTHER): Payer: Self-pay

## 2020-06-29 ENCOUNTER — Ambulatory Visit (INDEPENDENT_AMBULATORY_CARE_PROVIDER_SITE_OTHER): Payer: 59 | Admitting: Adult Health

## 2020-07-06 ENCOUNTER — Other Ambulatory Visit: Payer: Self-pay

## 2020-07-06 ENCOUNTER — Telehealth (INDEPENDENT_AMBULATORY_CARE_PROVIDER_SITE_OTHER): Payer: 59 | Admitting: Physician Assistant

## 2020-07-06 ENCOUNTER — Encounter: Payer: Self-pay | Admitting: Physician Assistant

## 2020-07-06 DIAGNOSIS — J0100 Acute maxillary sinusitis, unspecified: Secondary | ICD-10-CM | POA: Diagnosis not present

## 2020-07-06 MED ORDER — AMOXICILLIN-POT CLAVULANATE 875-125 MG PO TABS: 1.0000 | ORAL_TABLET | Freq: Two times a day (BID) | ORAL | 0 refills | Status: DC

## 2020-07-06 NOTE — Progress Notes (Signed)
Virtual Visit via Video   I connected with patient on 07/06/20 at  9:00 AM EDT by a video enabled telemedicine application and verified that I am speaking with the correct person using two identifiers.  Location patient: Home Location provider: [facility], [facility] Persons participating in the virtual visit: Patient, Provider, CMA (Patina Moore)  I discussed the limitations of evaluation and management by telemedicine and the availability of in person appointments. The patient expressed understanding and agreed to proceed.  Subjective:   HPI:   Patient presents via [facility] today 4 weeks of sinus pressure, headache and sinus pain. Associated symptoms include -- maxillary sinus tenderness, bilaterally. Denies fever, chills, aches. Denies chest congestion or cough. Did have Hand, Foot and Mouth in August. Denies chest tightness, SOB, recent travel or sick contact. Has been taking Mucinex and restarted saline nasal rinse.   ROS:   See pertinent positives and negatives per HPI.  Patient Active Problem List   Diagnosis Date Noted  . Vitamin D deficiency 06/16/2020  . Prediabetes 05/05/2020  . Stress 05/05/2020  . Class 1 obesity with serious comorbidity and body mass index (BMI) of 31.0 to 31.9 in adult 05/05/2020  . Attention deficit disorder 05/03/2020  . History of nephrolithiasis 04/05/2020  . Pelvic pain 12/22/2017  . SVD (spontaneous vaginal delivery) / [name] 11/21 09/12/2017  . 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 S/P ACL reconstruction 01/04/2016  . Raynaud's phenomenon 03/16/2015  . Sjogren's syndrome (HCC) 03/16/2015  . Mitral insufficiency and aortic stenosis 08/20/2014  . Migraine 05/25/2014  . Rhabdomyolysis 01/08/2014  . Mild intermittent asthma without complication 10/02/2013  . Allergic rhinitis 01/01/2013    Social History   Tobacco Use  . Smoking status: Never Smoker  . Smokeless tobacco: Never Used  Substance  Use Topics  . Alcohol use: Yes    Comment: occ    Current Outpatient Medications:  .  albuterol (PROVENTIL HFA;VENTOLIN HFA) 108 (90 Base) MCG/ACT inhaler, Inhale 1-2 puffs into the lungs every 6 (six) hours as needed for wheezing or shortness of breath., Disp: , Rfl:  .  ALFALFA PO, Take 1,000 mg by mouth daily. , Disp: , Rfl:  .  amphetamine-dextroamphetamine (ADDERALL XR) 10 MG 24 hr capsule, Take 1 capsule (10 mg total) by mouth daily., Disp: 30 capsule, Rfl: 0 .  butalbital-acetaminophen-caffeine (FIORICET) 50-325-40 MG tablet, Take 1 tablet by mouth every 6 (six) hours as needed for headache., Disp: 5 tablet, Rfl: 0 .  cetirizine (ZYRTEC) 10 MG tablet, Take 10 mg by mouth daily., Disp: , Rfl:  .  EPINEPHrine (EPIPEN 2-PAK) 0.3 mg/0.3 mL IJ SOAJ injection, Inject 0.3 mg into the skin daily as needed. For allergic reaction, Disp: , Rfl:  .  famotidine (PEPCID) 20 MG tablet, Take 20 mg by mouth as needed., Disp: , Rfl:  .  MAGNESIUM MALATE PO, Take 1,000 mg by mouth daily., Disp: , Rfl:  .  metFORMIN (GLUCOPHAGE) 500 MG tablet, Take 1 tablet (500 mg total) by mouth daily., Disp: 30 tablet, Rfl: 0 .  Prenatal Vit-Fe Fumarate-FA (PRENATAL MULTIVITAMIN) TABS tablet, Take 1 tablet by mouth daily at 12 noon., Disp: , Rfl:  .  Probiotic Product (PROBIOTIC PO), Take 2 capsules by mouth daily., Disp: , Rfl:  .  topiramate (TOPAMAX) 25 MG tablet, Take 1 tablet (25 mg total) by mouth daily., Disp: 90 tablet, Rfl: 1 .  Vitamin D, Ergocalciferol, (DRISDOL) 1.25 MG (50000 UNIT) CAPS capsule, Take 1 capsule (50,000 Units total) by mouth  every 7 (seven) days., Disp: 4 capsule, Rfl: 0  Allergies  Allergen Reactions  . Aspirin Hives  . Other Anaphylaxis    hazelnuts  . Codeine Nausea And Vomiting and Swelling  . Hydrocodone Nausea And Vomiting and Swelling  . Betamethasone Rash    On face and neck    Objective:   There were no vitals taken for this visit.  Patient is well-developed,  well-nourished in no acute distress.  Resting comfortably at home.  Head is normocephalic, atraumatic.  No labored breathing.  Speech is clear and coherent with logical content.  Patient is alert and oriented at baseline.  + TTP maxillary sinuses bilaterally.   Assessment and Plan:   1. Acute non-recurrent maxillary sinusitis Rx Augmentin.  Increase fluids.  Rest.  Saline nasal spray.  Probiotic.  Mucinex as directed.  Humidifier in bedroom. Restart Zyrtec.  Call or return to clinic if symptoms are not improving.     Piedad Climes, PA-C 07/06/2020

## 2020-07-06 NOTE — Progress Notes (Signed)
I have discussed the procedure for the virtual visit with the patient who has given consent to proceed with assessment and treatment.   Shelby Norton, CMA     

## 2020-07-06 NOTE — Patient Instructions (Signed)
Instructions sent to MyChart

## 2020-07-14 ENCOUNTER — Encounter: Payer: Self-pay | Admitting: Physician Assistant

## 2020-07-14 DIAGNOSIS — F988 Other specified behavioral and emotional disorders with onset usually occurring in childhood and adolescence: Secondary | ICD-10-CM

## 2020-07-14 MED ORDER — AMPHETAMINE-DEXTROAMPHET ER 10 MG PO CP24: 10.0000 mg | ORAL_CAPSULE | Freq: Every day | ORAL | 0 refills | Status: DC

## 2020-07-14 NOTE — Telephone Encounter (Signed)
Last OV 07/06/20 Adderall last filled 06/15/20 #30 with 0

## 2020-07-19 MED ORDER — DOXYCYCLINE HYCLATE 100 MG PO CAPS
100.0000 mg | ORAL_CAPSULE | Freq: Two times a day (BID) | ORAL | 0 refills | Status: DC
Start: 2020-07-19 — End: 2021-02-22

## 2020-08-30 ENCOUNTER — Other Ambulatory Visit: Payer: Self-pay | Admitting: Family Medicine

## 2020-08-30 DIAGNOSIS — F988 Other specified behavioral and emotional disorders with onset usually occurring in childhood and adolescence: Secondary | ICD-10-CM

## 2020-08-30 MED ORDER — AMPHETAMINE-DEXTROAMPHET ER 10 MG PO CP24: 10.0000 mg | ORAL_CAPSULE | Freq: Every day | ORAL | 0 refills | Status: DC

## 2020-08-30 NOTE — Telephone Encounter (Signed)
LFD 07/14/20 #30 with no refills LOV 07/06/20 NOV none

## 2020-09-05 ENCOUNTER — Other Ambulatory Visit (INDEPENDENT_AMBULATORY_CARE_PROVIDER_SITE_OTHER): Payer: Self-pay | Admitting: Adult Health

## 2020-09-05 DIAGNOSIS — E559 Vitamin D deficiency, unspecified: Secondary | ICD-10-CM

## 2020-09-05 DIAGNOSIS — R7303 Prediabetes: Secondary | ICD-10-CM

## 2020-09-06 ENCOUNTER — Other Ambulatory Visit: Payer: Self-pay | Admitting: Physician Assistant

## 2020-09-06 DIAGNOSIS — G43809 Other migraine, not intractable, without status migrainosus: Secondary | ICD-10-CM

## 2020-10-12 ENCOUNTER — Other Ambulatory Visit: Payer: Self-pay | Admitting: Physician Assistant

## 2020-10-12 DIAGNOSIS — F988 Other specified behavioral and emotional disorders with onset usually occurring in childhood and adolescence: Secondary | ICD-10-CM

## 2020-10-12 MED ORDER — AMPHETAMINE-DEXTROAMPHET ER 10 MG PO CP24: 10.0000 mg | ORAL_CAPSULE | Freq: Every day | ORAL | 0 refills | Status: DC

## 2020-10-12 NOTE — Telephone Encounter (Signed)
LFD 08/30/20 #30 with no refills LOV 07/06/20 NOV none

## 2020-11-25 ENCOUNTER — Other Ambulatory Visit: Payer: Self-pay | Admitting: Physician Assistant

## 2020-11-25 DIAGNOSIS — F988 Other specified behavioral and emotional disorders with onset usually occurring in childhood and adolescence: Secondary | ICD-10-CM

## 2020-11-25 NOTE — Telephone Encounter (Signed)
Requesting:Adderral XR 10mg  24hr  Contract: UDS: Last Visit:07/06/20 Next Visit:n/a Last Refill:10/12/20 30 caps 0 refills Please Advise

## 2020-11-26 MED ORDER — AMPHETAMINE-DEXTROAMPHET ER 10 MG PO CP24: 10.0000 mg | ORAL_CAPSULE | Freq: Every day | ORAL | 0 refills | Status: DC

## 2021-01-05 ENCOUNTER — Telehealth: Payer: Self-pay | Admitting: Physician Assistant

## 2021-01-05 ENCOUNTER — Telehealth: Payer: Self-pay

## 2021-01-05 DIAGNOSIS — F988 Other specified behavioral and emotional disorders with onset usually occurring in childhood and adolescence: Secondary | ICD-10-CM

## 2021-01-05 NOTE — Telephone Encounter (Signed)
Patient has appt scheduled 01/25/21 for TOC.  Can we please fill this medication through this appt date?

## 2021-01-05 NOTE — Telephone Encounter (Signed)
..  Medication Refills  Last OV:  Medication:  Adderall XR  Pharmacy:  [facility] in [place]  Let patient know to contact pharmacy at the end of the day to make sure medication is ready.   Please notify patient to allow 48-72 hours to process.  Encourage patient to contact the pharmacy for refills or they can request refills through [facility]  Clinical Fills out below:   Last refill:  QTY:  Refill Date:    Other Comments:   Okay for refill?  Please advise.

## 2021-01-05 NOTE — Telephone Encounter (Signed)
Requesting:Adderral XR 10 mg 24hr Contract: UDS: Last Visit:07/06/20 Next Visit:01/25/21 TOC Last Refill:11/26/20 30 tabs 0 refills  Please Advise

## 2021-01-05 NOTE — Telephone Encounter (Signed)
Left patient vm to call back to get TOC scheduled with Neva Seat or Kateri Plummer.

## 2021-01-06 MED ORDER — AMPHETAMINE-DEXTROAMPHET ER 10 MG PO CP24: 10.0000 mg | ORAL_CAPSULE | Freq: Every day | ORAL | 0 refills | Status: DC

## 2021-01-25 ENCOUNTER — Encounter: Payer: 59 | Admitting: Registered Nurse

## 2021-02-22 ENCOUNTER — Other Ambulatory Visit: Payer: Self-pay

## 2021-02-22 ENCOUNTER — Encounter: Payer: Self-pay | Admitting: Registered Nurse

## 2021-02-22 ENCOUNTER — Ambulatory Visit: Payer: 59 | Admitting: Registered Nurse

## 2021-02-22 VITALS — BP 144/85 | HR 89 | Temp 98.3°F | Resp 18 | Ht 65.0 in | Wt 188.0 lb

## 2021-02-22 DIAGNOSIS — R7309 Other abnormal glucose: Secondary | ICD-10-CM

## 2021-02-22 DIAGNOSIS — E669 Obesity, unspecified: Secondary | ICD-10-CM

## 2021-02-22 DIAGNOSIS — Z7689 Persons encountering health services in other specified circumstances: Secondary | ICD-10-CM

## 2021-02-22 DIAGNOSIS — E559 Vitamin D deficiency, unspecified: Secondary | ICD-10-CM

## 2021-02-22 DIAGNOSIS — R7303 Prediabetes: Secondary | ICD-10-CM

## 2021-02-22 DIAGNOSIS — G43809 Other migraine, not intractable, without status migrainosus: Secondary | ICD-10-CM

## 2021-02-22 DIAGNOSIS — Z1159 Encounter for screening for other viral diseases: Secondary | ICD-10-CM | POA: Diagnosis not present

## 2021-02-22 DIAGNOSIS — F988 Other specified behavioral and emotional disorders with onset usually occurring in childhood and adolescence: Secondary | ICD-10-CM

## 2021-02-22 DIAGNOSIS — Z6831 Body mass index (BMI) 31.0-31.9, adult: Secondary | ICD-10-CM | POA: Diagnosis not present

## 2021-02-22 LAB — COMPREHENSIVE METABOLIC PANEL
ALT: 42 U/L — ABNORMAL HIGH (ref 0–35)
AST: 31 U/L (ref 0–37)
Albumin: 4.6 g/dL (ref 3.5–5.2)
Alkaline Phosphatase: 113 U/L (ref 39–117)
BUN: 11 mg/dL (ref 6–23)
CO2: 29 mEq/L (ref 19–32)
Calcium: 9.5 mg/dL (ref 8.4–10.5)
Chloride: 103 mEq/L (ref 96–112)
Creatinine, Ser: 0.75 mg/dL (ref 0.40–1.20)
GFR: 101.71 mL/min (ref >60.00)
Glucose, Bld: 105 mg/dL — ABNORMAL HIGH (ref 70–99)
Potassium: 4 mEq/L (ref 3.5–5.1)
Sodium: 139 mEq/L (ref 135–145)
Total Bilirubin: 0.8 mg/dL (ref 0.2–1.2)
Total Protein: 6.7 g/dL (ref 6.0–8.3)

## 2021-02-22 LAB — LIPID PANEL
Cholesterol: 236 mg/dL — ABNORMAL HIGH (ref 0–200)
HDL: 77.4 mg/dL (ref >39.00)
LDL Cholesterol: 143 mg/dL — ABNORMAL HIGH (ref 0–99)
NonHDL: 158.14
Total CHOL/HDL Ratio: 3
Triglycerides: 78 mg/dL (ref 0.0–149.0)
VLDL: 15.6 mg/dL (ref 0.0–40.0)

## 2021-02-22 LAB — CBC WITH DIFFERENTIAL/PLATELET
Basophils Absolute: 0.1 10*3/uL (ref 0.0–0.1)
Basophils Relative: 1.1 % (ref 0.0–3.0)
Eosinophils Absolute: 0.8 10*3/uL — ABNORMAL HIGH (ref 0.0–0.7)
Eosinophils Relative: 10.2 % — ABNORMAL HIGH (ref 0.0–5.0)
HCT: 40 % (ref 36.0–46.0)
Hemoglobin: 13.3 g/dL (ref 12.0–15.0)
Lymphocytes Relative: 30.2 % (ref 12.0–46.0)
Lymphs Abs: 2.2 10*3/uL (ref 0.7–4.0)
MCHC: 33.2 g/dL (ref 30.0–36.0)
MCV: 86 fl (ref 78.0–100.0)
Monocytes Absolute: 0.3 10*3/uL (ref 0.1–1.0)
Monocytes Relative: 4 % (ref 3.0–12.0)
Neutro Abs: 4.1 10*3/uL (ref 1.4–7.7)
Neutrophils Relative %: 54.5 % (ref 43.0–77.0)
Platelets: 326 10*3/uL (ref 150.0–400.0)
RBC: 4.64 Mil/uL (ref 3.87–5.11)
RDW: 13 % (ref 11.5–15.5)
WBC: 7.4 10*3/uL (ref 4.0–10.5)

## 2021-02-22 LAB — TSH: TSH: 2.56 u[IU]/mL (ref 0.35–4.50)

## 2021-02-22 LAB — HEMOGLOBIN A1C: Hgb A1c MFr Bld: 5.6 % (ref 4.6–6.5)

## 2021-02-22 LAB — VITAMIN D 25 HYDROXY (VIT D DEFICIENCY, FRACTURES): VITD: 28.59 ng/mL — ABNORMAL LOW (ref 30.00–100.00)

## 2021-02-22 MED ORDER — METFORMIN HCL 500 MG PO TABS: 500.0000 mg | ORAL_TABLET | Freq: Every day | ORAL | 1 refills | Status: DC

## 2021-02-22 MED ORDER — VITAMIN D (ERGOCALCIFEROL) 1.25 MG (50000 UNIT) PO CAPS: 50000.0000 [IU] | ORAL_CAPSULE | ORAL | 0 refills | Status: DC

## 2021-02-22 MED ORDER — AMPHETAMINE-DEXTROAMPHET ER 10 MG PO CP24: 10.0000 mg | ORAL_CAPSULE | Freq: Every day | ORAL | 0 refills | Status: DC

## 2021-02-22 MED ORDER — TOPIRAMATE 25 MG PO TABS: 25.0000 mg | ORAL_TABLET | Freq: Every day | ORAL | 1 refills | Status: DC

## 2021-02-22 NOTE — Patient Instructions (Addendum)
  [name] to meet you. Medications have been refilled. Let me know if you're due to a refill of fioricet or adderall.  Let's plan on a 6 mo follow up - this is my general standard for ADD/ADHD medications.   Labs today: Vit D, A1c, CMP, CBC, TSH, Lipid Panel, Hep C ab (recommended once lifetime screening). These will be in MyChart by tomorrow. I'll throw some comments on there to let you know my thoughts, and I'll call you if I have substantial worries.   Let me know if there's anything I can do for you  See you in Nov,  Rich   If you have lab work done today you will be contacted with your lab results within the next 2 weeks.  If you have not heard from [place] then please contact us. The fastest way to get your results is to register for My Chart.   IF you received an x-ray today, you will receive an invoice from [facility]. Please contact [facility] at [phone] with questions or concerns regarding your invoice.   IF you received labwork today, you will receive an invoice from [facility]. Please contact [facility] at [phone] with questions or concerns regarding your invoice.   Our billing staff will not be able to assist you with questions regarding bills from these companies.  You will be contacted with the lab results as soon as they are available. The fastest way to get your results is to activate your My Chart account. Instructions are located on the last page of this paperwork. If you have not heard from [place] regarding the results in 2 weeks, please contact this office.

## 2021-02-22 NOTE — Progress Notes (Signed)
Established Patient Office Visit  Subjective:  Patient ID: Shelby Norton, female    DOB: 03/30/1981  Age: 40 y.o. MRN: [id number]  CC:  Chief Complaint  Patient presents with  . Transitions Of Care    Patient states she is here for a TOC and also medication refills and discuss changes.    HPI Shelby Norton presents for visit to est care  [place] [facility] [place] [place]  Histories reviewed and updated with patient:  Mitral valve insufficiency and aortic stenosis: formerly est with cardiology. Hx of stress tests and echos normal. No recent palpitations. Has left cardiology care but will follow up on as needed basis. No recent symptoms.  Prediabetes, preeclampsia: worsened during pregnancy. Two children. No ongoing concerns. Taking metformin to help with weight management.  Migraines: topamax 25mg  PO qd. Good effect. No AEs. Has to use Fioricet once or twice annually for breakthrough. No new concerns. Would like to maintain  ADD/ADHD: adderall 10mg  ER po qd. Good effect. No AEs. Usually M-F dosing but will likely take daily as she prepares for GRE, grad school, etc.  Raynaud and Sjogren: stable. Took procardia while breastfeeding to help with pain, some effect. Monitors symptoms closely. No known fam hx of autoimmune processes.   Vit D deficiency: had been supplemented by [facility]. Would like to check today and refill if possible.  Obesity: had developed good toolset with [facility]. Has left their care. Doing well now. No new concerns. Would like vit d and metformin refilled.   Past Medical History:  Diagnosis Date  . Allergy   . Anemia   . Complication of anesthesia    per pt hard to wake  . GERD (gastroesophageal reflux disease)   . Heart murmur   . Hematuria   . History of exercise stress test    ETT 03-09-2016 in careeverywhere in epic-- result negative  . History of gestational hypertension 07/2017   preeclamptic  . History of  kidney stones   . History of pre-eclampsia    10/ 2018;  2010  . History of rhabdomyolysis 12/2013  . History of supraventricular tachycardia    per pt hx one episode , last halter montior  05/ 2017 note svt ectopy x4 [facility] [place]  . HSV-1 (herpes simplex virus 1) infection   . Lactose intolerance   . Migraine   . Migraines   . Mild asthma    prn inhaler  . Mitral valve prolapse    per pt dx mvp yrs ago and has been followed cardiologist---  [facility]/ 2017 [facility] [place]  in careeverywhere  . MTHFR mutation   . Neuromuscular disorder (HCC)   . Palpitations   . Raynauds syndrome    took procardia  . Rheumatic fever   . Swallowing difficulty   . Wears contact lenses     Past Surgical History:  Procedure Laterality Date  . ANTERIOR CRUCIATE LIGAMENT REPAIR Right 10/2015  . CYSTOSCOPY/URETEROSCOPY/HOLMIUM LASER/STENT PLACEMENT Bilateral 12/24/2017   Procedure: CYSTOSCOPY/RETROGRADE/URETEROSCOPY/HOLMIUM LASER LITHOTRIPSY, STONE BASKETRY /STENT PLACEMENT;  Surgeon: Rene PaciWinter, Christopher Aaron, MD;  [facility] [place];  Service: Urology;  Laterality: Bilateral;  . HOLMIUM LASER APPLICATION Bilateral 12/24/2017   Procedure: HOLMIUM LASER APPLICATION;  Surgeon: Rene PaciWinter, Christopher Aaron, MD;  [facility] [place];  Service: Urology;  Laterality: Bilateral;  . KIDNEY STONE SURGERY    . NASAL SEPTUM SURGERY  02/2013  .  NASAL SINUS SURGERY    . TOE SOFT TISSUE RELEASE W/ PINNING  2020   5th toe  . TONSILLECTOMY  1991  . WISDOM TOOTH EXTRACTION      Family History  Problem Relation Age of Onset  . Diabetes Mother   . Hypertension Mother   . Hyperlipidemia Mother   . Obesity Mother   . Diabetes Father   . Hypertension Father   . Other Father        TIA  . Stroke Father   . Heart disease Father   . Kidney disease Father   . Liver disease Father   . Alcoholism Father   . Obesity Father    . Lung cancer Maternal Grandmother        chronic secondhand smoke exposure  . Brain cancer Maternal Grandmother   . Breast cancer Maternal Grandmother   . Stroke Paternal Grandfather   . Diabetes Brother     Social History   Socioeconomic History  . Marital status: Married    Spouse name: Not on file  . Number of children: 2  . Years of education: Not on file  . Highest education level: Not on file  Occupational History  . Occupation: Charity fundraiser  Tobacco Use  . Smoking status: Never Smoker  . Smokeless tobacco: Never Used  Vaping Use  . Vaping Use: Never used  Substance and Sexual Activity  . Alcohol use: Yes    Comment: occ  . Drug use: No  . Sexual activity: Yes    Birth control/protection: None  Other Topics Concern  . Not on file  Social History Narrative  . Not on file   Social Determinants of Health   Financial Resource Strain: Not on file  Food Insecurity: Not on file  Transportation Needs: Not on file  Physical Activity: Not on file  Stress: Not on file  Social Connections: Not on file  Intimate Partner Violence: Not on file    Outpatient Medications Prior to Visit  Medication Sig Dispense Refill  . albuterol (PROVENTIL HFA;VENTOLIN HFA) 108 (90 Base) MCG/ACT inhaler Inhale 1-2 puffs into the lungs every 6 (six) hours as needed for wheezing or shortness of breath.    Marland Kitchen ALFALFA PO Take 1,000 mg by mouth daily.     . cetirizine (ZYRTEC) 10 MG tablet Take 10 mg by mouth daily.    . Prenatal Vit-Fe Fumarate-FA (PRENATAL MULTIVITAMIN) TABS tablet Take 1 tablet by mouth daily at 12 noon.    . Probiotic Product (PROBIOTIC PO) Take 2 capsules by mouth daily.    Marland Kitchen amphetamine-dextroamphetamine (ADDERALL XR) 10 MG 24 hr capsule Take 1 capsule (10 mg total) by mouth daily. 30 capsule 0  . metFORMIN (GLUCOPHAGE) 500 MG tablet Take 1 tablet (500 mg total) by mouth daily. 30 tablet 0  . topiramate (TOPAMAX) 25 MG tablet Take 1 tablet (25 mg total) by mouth daily. 90 tablet  1  . Vitamin D, Ergocalciferol, (DRISDOL) 1.25 MG (50000 UNIT) CAPS capsule Take 1 capsule (50,000 Units total) by mouth every 7 (seven) days. 4 capsule 0  . butalbital-acetaminophen-caffeine (FIORICET) 50-325-40 MG tablet Take 1 tablet by mouth every 6 (six) hours as needed for headache. (Patient not taking: Reported on 02/22/2021) 5 tablet 0  . EPINEPHrine 0.3 mg/0.3 mL IJ SOAJ injection Inject 0.3 mg into the skin daily as needed. For allergic reaction (Patient not taking: Reported on 02/22/2021)    . famotidine (PEPCID) 20 MG tablet Take 20 mg by mouth as needed. (  Patient not taking: Reported on 02/22/2021)    . MAGNESIUM MALATE PO Take 1,000 mg by mouth daily. (Patient not taking: Reported on 02/22/2021)    . doxycycline (VIBRAMYCIN) 100 MG capsule Take 1 capsule (100 mg total) by mouth 2 (two) times daily. (Patient not taking: Reported on 02/22/2021) 20 capsule 0   No facility-administered medications prior to visit.    Allergies  Allergen Reactions  . Aspirin Hives  . Other Anaphylaxis    hazelnuts  . Codeine Nausea And Vomiting and Swelling  . Hydrocodone Nausea And Vomiting and Swelling  . Betamethasone Rash    On face and neck    ROS Review of Systems  Constitutional: Negative.   HENT: Negative.   Eyes: Negative.   Respiratory: Negative.   Cardiovascular: Negative.   Gastrointestinal: Negative.   Genitourinary: Negative.   Musculoskeletal: Negative.   Skin: Negative.   Neurological: Negative.   Psychiatric/Behavioral: Negative.   All other systems reviewed and are negative.     Objective:    Physical Exam Vitals and nursing note reviewed.  Constitutional:      General: She is not in acute distress.    Appearance: Normal appearance. She is normal weight. She is not ill-appearing, toxic-appearing or diaphoretic.  Cardiovascular:     Rate and Rhythm: Normal rate and regular rhythm.     Heart sounds: Normal heart sounds. No murmur heard. No friction rub. No gallop.    Pulmonary:     Effort: Pulmonary effort is normal. No respiratory distress.     Breath sounds: Normal breath sounds. No stridor. No wheezing, rhonchi or rales.  Chest:     Chest wall: No tenderness.  Skin:    General: Skin is warm and dry.  Neurological:     General: No focal deficit present.     Mental Status: She is alert and oriented to person, place, and time. Mental status is at baseline.  Psychiatric:        Mood and Affect: Mood normal.        Behavior: Behavior normal.        Thought Content: Thought content normal.        Judgment: Judgment normal.     BP (!) 144/85   Pulse 89   Temp 98.3 F (36.8 C) (Temporal)   Resp 18   Ht  (1.651 m)   Wt 188 lb (85.3 kg)   SpO2 99%   BMI 31.28 kg/m  Wt Readings from Last 3 Encounters:  02/22/21 188 lb (85.3 kg)  06/16/20 188 lb (85.3 kg)  05/19/20 195 lb (88.5 kg)     Health Maintenance Due  Topic Date Due  . Hepatitis C Screening  Never done    There are no preventive care reminders to display for this patient.  Lab Results  Component Value Date   TSH 1.42 04/05/2020   Lab Results  Component Value Date   WBC 9.2 06/05/2020   HGB 14.2 06/05/2020   HCT 43.6 06/05/2020   MCV 87.9 06/05/2020   PLT 317 06/05/2020   Lab Results  Component Value Date   NA 140 06/05/2020   K 3.4 (L) 06/05/2020   CO2 23 06/05/2020   GLUCOSE 131 (H) 06/05/2020   BUN 7 06/05/2020   CREATININE 0.92 06/05/2020   BILITOT 0.7 04/05/2020   ALKPHOS 96 04/05/2020   AST 18 04/05/2020   ALT 19 04/05/2020   PROT 6.8 04/05/2020   ALBUMIN 4.7 04/05/2020   CALCIUM 9.7 06/05/2020  ANIONGAP 11 06/05/2020   GFR 102.83 04/05/2020   Lab Results  Component Value Date   CHOL 198 04/05/2020   Lab Results  Component Value Date   HDL 65.90 04/05/2020   Lab Results  Component Value Date   LDLCALC 113 (H) 04/05/2020   Lab Results  Component Value Date   TRIG 98.0 04/05/2020   Lab Results  Component Value Date   CHOLHDL 3  04/05/2020   Lab Results  Component Value Date   HGBA1C 6.3 04/05/2020      Assessment & Plan:   Problem List Items Addressed This Visit      Cardiovascular and Mediastinum   Migraine   Relevant Medications   topiramate (TOPAMAX) 25 MG tablet     Other   Attention deficit disorder   Relevant Medications   amphetamine-dextroamphetamine (ADDERALL XR) 10 MG 24 hr capsule   Prediabetes   Relevant Medications   metFORMIN (GLUCOPHAGE) 500 MG tablet   Class 1 obesity with serious comorbidity and body mass index (BMI) of 31.0 to 31.9 in adult   Relevant Medications   metFORMIN (GLUCOPHAGE) 500 MG tablet   amphetamine-dextroamphetamine (ADDERALL XR) 10 MG 24 hr capsule   Other Relevant Orders   TSH   CBC with Differential/Platelet   Lipid panel   Vitamin D deficiency - Primary   Relevant Medications   Vitamin D, Ergocalciferol, (DRISDOL) 1.25 MG (50000 UNIT) CAPS capsule   Other Relevant Orders   CBC with Differential/Platelet   Vitamin D (25 hydroxy)    Other Visit Diagnoses    Screening for viral disease       Relevant Orders   CBC with Differential/Platelet   Hepatitis C antibody   Elevated hemoglobin A1c       Relevant Orders   CBC with Differential/Platelet   Comprehensive metabolic panel   Hemoglobin A1c      Meds ordered this encounter  Medications  . metFORMIN (GLUCOPHAGE) 500 MG tablet    Sig: Take 1 tablet (500 mg total) by mouth daily.    Dispense:  90 tablet    Refill:  1    Order Specific Question:   Supervising Provider    Answer:   Neva Seat, JEFFREY R [[id number]]  . Vitamin D, Ergocalciferol, (DRISDOL) 1.25 MG (50000 UNIT) CAPS capsule    Sig: Take 1 capsule (50,000 Units total) by mouth every 7 (seven) days.    Dispense:  12 capsule    Refill:  0    Order Specific Question:   Supervising Provider    Answer:   Neva Seat, JEFFREY R [[id number]]  . topiramate (TOPAMAX) 25 MG tablet    Sig: Take 1 tablet (25 mg total) by mouth daily.    Dispense:  90 tablet     Refill:  1    Order Specific Question:   Supervising Provider    Answer:   Neva Seat, JEFFREY R [[id number]]  . amphetamine-dextroamphetamine (ADDERALL XR) 10 MG 24 hr capsule    Sig: Take 1 capsule (10 mg total) by mouth daily.    Dispense:  90 capsule    Refill:  0    Order Specific Question:   Supervising Provider    Answer:   Neva Seat, JEFFREY R [[id number]]    Follow-up: Return in about 6 months (around 08/25/2021).    Refill meds as above. Continue regimen.  Return in 6 mo for med check  Labs collected. Will follow up with the patient as warranted.  bp mildly high on arrival. Plan to  recheck before labs. Discussed cardiac precautions with patient in depth, who voiced understanding.  Patient encouraged to call clinic with any questions, comments, or concerns.  Janeece Agee, NP

## 2021-02-23 LAB — HEPATITIS C ANTIBODY
Hepatitis C Ab: NONREACTIVE
SIGNAL TO CUT-OFF: 0 (ref <1.00)

## 2021-03-02 ENCOUNTER — Other Ambulatory Visit (HOSPITAL_BASED_OUTPATIENT_CLINIC_OR_DEPARTMENT_OTHER): Payer: Self-pay

## 2021-04-02 ENCOUNTER — Other Ambulatory Visit: Payer: Self-pay | Admitting: Registered Nurse

## 2021-04-02 DIAGNOSIS — F988 Other specified behavioral and emotional disorders with onset usually occurring in childhood and adolescence: Secondary | ICD-10-CM

## 2021-04-03 MED ORDER — AMPHETAMINE-DEXTROAMPHET ER 10 MG PO CP24: 10.0000 mg | ORAL_CAPSULE | Freq: Every day | ORAL | 0 refills | Status: DC

## 2021-04-03 NOTE — Telephone Encounter (Signed)
LFD 02/22/21 #90 with no refills. Patient not due. Unable to refuse.

## 2021-05-04 ENCOUNTER — Encounter: Payer: Self-pay | Admitting: Registered Nurse

## 2021-05-04 ENCOUNTER — Other Ambulatory Visit: Payer: Self-pay

## 2021-05-04 ENCOUNTER — Ambulatory Visit: Payer: 59 | Admitting: Registered Nurse

## 2021-05-04 ENCOUNTER — Other Ambulatory Visit: Payer: Self-pay | Admitting: Registered Nurse

## 2021-05-04 VITALS — BP 140/85 | HR 84 | Temp 98.3°F | Resp 18 | Ht 65.0 in | Wt 185.2 lb

## 2021-05-04 DIAGNOSIS — R5382 Chronic fatigue, unspecified: Secondary | ICD-10-CM | POA: Diagnosis not present

## 2021-05-04 DIAGNOSIS — F988 Other specified behavioral and emotional disorders with onset usually occurring in childhood and adolescence: Secondary | ICD-10-CM

## 2021-05-04 DIAGNOSIS — N6459 Other signs and symptoms in breast: Secondary | ICD-10-CM | POA: Diagnosis not present

## 2021-05-04 DIAGNOSIS — R61 Generalized hyperhidrosis: Secondary | ICD-10-CM | POA: Diagnosis not present

## 2021-05-04 MED ORDER — AMPHETAMINE-DEXTROAMPHET ER 10 MG PO CP24: 10.0000 mg | ORAL_CAPSULE | Freq: Every day | ORAL | 0 refills | Status: DC

## 2021-05-04 NOTE — Patient Instructions (Addendum)
Shelby Norton -   A lot could be contributing - will check labs  Screening and diagnostic mammography ordered to [facility] - you can call them if you want to be proactive about scheduling, but they're usually not booked too far out and should call soon  Refills on adderall x 3 mo sent set to release a few days before due.  I'll be in touch soon with results  Thank you  Rich     If you have lab work done today you will be contacted with your lab results within the next 2 weeks.  If you have not heard from [place] then please contact us. The fastest way to get your results is to register for My Chart.   IF you received an x-ray today, you will receive an invoice from [facility]. Please contact [facility] at [phone] with questions or concerns regarding your invoice.   IF you received labwork today, you will receive an invoice from [facility]. Please contact [facility] at [phone] with questions or concerns regarding your invoice.   Our billing staff will not be able to assist you with questions regarding bills from these companies.  You will be contacted with the lab results as soon as they are available. The fastest way to get your results is to activate your My Chart account. Instructions are located on the last page of this paperwork. If you have not heard from [place] regarding the results in 2 weeks, please contact this office.
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
## 2021-05-08 ENCOUNTER — Encounter: Payer: Self-pay | Admitting: Registered Nurse

## 2021-05-09 ENCOUNTER — Other Ambulatory Visit: Payer: Self-pay | Admitting: Registered Nurse

## 2021-05-09 DIAGNOSIS — R7989 Other specified abnormal findings of blood chemistry: Secondary | ICD-10-CM

## 2021-05-09 DIAGNOSIS — R5382 Chronic fatigue, unspecified: Secondary | ICD-10-CM

## 2021-05-09 NOTE — Progress Notes (Signed)
Abnormal findings on CBC and elevated LFT - will order future labs and [place] RUQ to investigate. Pt informed via MyChart  [name], NP

## 2021-05-10 ENCOUNTER — Encounter: Payer: Self-pay | Admitting: Registered Nurse

## 2021-05-10 ENCOUNTER — Other Ambulatory Visit: Payer: Self-pay | Admitting: Registered Nurse

## 2021-05-10 DIAGNOSIS — E559 Vitamin D deficiency, unspecified: Secondary | ICD-10-CM

## 2021-05-10 LAB — COMPREHENSIVE METABOLIC PANEL
ALT: 43 IU/L — ABNORMAL HIGH (ref 0–32)
AST: 45 IU/L — ABNORMAL HIGH (ref 0–40)
Albumin/Globulin Ratio: 2.3 — ABNORMAL HIGH (ref 1.2–2.2)
Albumin: 4.5 g/dL (ref 3.8–4.8)
Alkaline Phosphatase: 121 IU/L (ref 44–121)
BUN/Creatinine Ratio: 15 (ref 9–23)
BUN: 12 mg/dL (ref 6–20)
Bilirubin Total: 0.4 mg/dL (ref 0.0–1.2)
CO2: 23 mmol/L (ref 20–29)
Calcium: 9.2 mg/dL (ref 8.7–10.2)
Chloride: 104 mmol/L (ref 96–106)
Creatinine, Ser: 0.78 mg/dL (ref 0.57–1.00)
Globulin, Total: 2 g/dL (ref 1.5–4.5)
Glucose: 93 mg/dL (ref 65–99)
Potassium: 5.1 mmol/L (ref 3.5–5.2)
Sodium: 140 mmol/L (ref 134–144)
Total Protein: 6.5 g/dL (ref 6.0–8.5)
eGFR: 100 mL/min/{1.73_m2} (ref >59)

## 2021-05-10 LAB — QUANTIFERON-TB GOLD PLUS
QuantiFERON Mitogen Value: >10 IU/mL
QuantiFERON Nil Value: 0 IU/mL
QuantiFERON TB1 Ag Value: 0.08 IU/mL
QuantiFERON TB2 Ag Value: 0.1 IU/mL
QuantiFERON-TB Gold Plus: NEGATIVE

## 2021-05-10 LAB — CBC WITH DIFFERENTIAL/PLATELET
Basophils Absolute: 0.1 10*3/uL (ref 0.0–0.2)
Basos: 1 %
EOS (ABSOLUTE): 0.7 10*3/uL — ABNORMAL HIGH (ref 0.0–0.4)
Eos: 9 %
Hematocrit: 41.2 % (ref 34.0–46.6)
Hemoglobin: 14.2 g/dL (ref 11.1–15.9)
Immature Grans (Abs): 0 10*3/uL (ref 0.0–0.1)
Immature Granulocytes: 0 %
Lymphocytes Absolute: 3.4 10*3/uL — ABNORMAL HIGH (ref 0.7–3.1)
Lymphs: 40 %
MCH: 29.8 pg (ref 26.6–33.0)
MCHC: 34.5 g/dL (ref 31.5–35.7)
MCV: 86 fL (ref 79–97)
Monocytes Absolute: 0.4 10*3/uL (ref 0.1–0.9)
Monocytes: 5 %
Neutrophils Absolute: 3.8 10*3/uL (ref 1.4–7.0)
Neutrophils: 45 %
Platelets: 333 10*3/uL (ref 150–450)
RBC: 4.77 x10E6/uL (ref 3.77–5.28)
RDW: 12.4 % (ref 11.7–15.4)
WBC: 8.4 10*3/uL (ref 3.4–10.8)

## 2021-05-10 LAB — CMV IGM: CMV IgM Ser EIA-aCnc: <30 AU/mL (ref 0.0–29.9)

## 2021-05-10 LAB — VITAMIN D 25 HYDROXY (VIT D DEFICIENCY, FRACTURES): Vit D, 25-Hydroxy: 51.7 ng/mL (ref 30.0–100.0)

## 2021-05-10 LAB — EPSTEIN-BARR VIRUS NUCLEAR ANTIGEN ANTIBODY, IGG: EBV NA IgG: >600 U/mL — ABNORMAL HIGH (ref 0.0–17.9)

## 2021-05-10 LAB — TSH: TSH: 1.7 u[IU]/mL (ref 0.450–4.500)

## 2021-05-10 NOTE — Telephone Encounter (Signed)
Patient wants to clarify results. She wants to know if she has a current infection or immunity. She has confusion of the interpretation of test results, how an Igg would relate to her having mono in the present. Patient would like for you to call her at your convenience to discuss [phone].

## 2021-05-11 ENCOUNTER — Other Ambulatory Visit: Payer: Self-pay

## 2021-05-11 ENCOUNTER — Other Ambulatory Visit (INDEPENDENT_AMBULATORY_CARE_PROVIDER_SITE_OTHER): Payer: 59

## 2021-05-11 DIAGNOSIS — R7989 Other specified abnormal findings of blood chemistry: Secondary | ICD-10-CM | POA: Diagnosis not present

## 2021-05-11 DIAGNOSIS — R61 Generalized hyperhidrosis: Secondary | ICD-10-CM | POA: Diagnosis not present

## 2021-05-11 DIAGNOSIS — R5382 Chronic fatigue, unspecified: Secondary | ICD-10-CM

## 2021-05-11 LAB — CBC WITH DIFFERENTIAL/PLATELET
Basophils Absolute: 0.1 10*3/uL (ref 0.0–0.1)
Basophils Relative: 0.7 % (ref 0.0–3.0)
Eosinophils Absolute: 0.8 10*3/uL — ABNORMAL HIGH (ref 0.0–0.7)
Eosinophils Relative: 8.9 % — ABNORMAL HIGH (ref 0.0–5.0)
HCT: 40.5 % (ref 36.0–46.0)
Hemoglobin: 13.7 g/dL (ref 12.0–15.0)
Lymphocytes Relative: 36 % (ref 12.0–46.0)
Lymphs Abs: 3.1 10*3/uL (ref 0.7–4.0)
MCHC: 33.7 g/dL (ref 30.0–36.0)
MCV: 85.4 fl (ref 78.0–100.0)
Monocytes Absolute: 0.4 10*3/uL (ref 0.1–1.0)
Monocytes Relative: 4.1 % (ref 3.0–12.0)
Neutro Abs: 4.3 10*3/uL (ref 1.4–7.7)
Neutrophils Relative %: 50.3 % (ref 43.0–77.0)
Platelets: 339 10*3/uL (ref 150.0–400.0)
RBC: 4.75 Mil/uL (ref 3.87–5.11)
RDW: 12.9 % (ref 11.5–15.5)
WBC: 8.5 10*3/uL (ref 4.0–10.5)

## 2021-05-11 LAB — COMPREHENSIVE METABOLIC PANEL
ALT: 33 U/L (ref 0–35)
AST: 29 U/L (ref 0–37)
Albumin: 4.3 g/dL (ref 3.5–5.2)
Alkaline Phosphatase: 94 U/L (ref 39–117)
BUN: 15 mg/dL (ref 6–23)
CO2: 22 mEq/L (ref 19–32)
Calcium: 9.1 mg/dL (ref 8.4–10.5)
Chloride: 105 mEq/L (ref 96–112)
Creatinine, Ser: 0.73 mg/dL (ref 0.40–1.20)
GFR: 104.91 mL/min (ref >60.00)
Glucose, Bld: 102 mg/dL — ABNORMAL HIGH (ref 70–99)
Potassium: 4.3 mEq/L (ref 3.5–5.1)
Sodium: 138 mEq/L (ref 135–145)
Total Bilirubin: 0.9 mg/dL (ref 0.2–1.2)
Total Protein: 6.2 g/dL (ref 6.0–8.3)

## 2021-05-11 LAB — C-REACTIVE PROTEIN: CRP: <1 mg/dL (ref 0.5–20.0)

## 2021-05-11 LAB — TSH: TSH: 1.36 u[IU]/mL (ref 0.35–5.50)

## 2021-05-11 LAB — SEDIMENTATION RATE: Sed Rate: 7 mm/hr (ref 0–20)

## 2021-05-12 ENCOUNTER — Ambulatory Visit (HOSPITAL_COMMUNITY)
Admission: RE | Admit: 2021-05-12 | Discharge: 2021-05-12 | Disposition: A | Discharge status: Home / Self Care | Payer: 59 | Source: Ambulatory Visit | Attending: Registered Nurse | Admitting: Registered Nurse

## 2021-05-12 DIAGNOSIS — R7989 Other specified abnormal findings of blood chemistry: Secondary | ICD-10-CM

## 2021-05-14 LAB — EBV AB TO VIRAL CAPSID AG PNL, IGG+IGM
EBV VCA IgG: 285 U/mL — ABNORMAL HIGH (ref 0.0–17.9)
EBV VCA IgM: <36 U/mL (ref 0.0–35.9)

## 2021-05-18 LAB — PATHOLOGIST SMEAR REVIEW

## 2021-05-18 LAB — ANA

## 2021-05-18 LAB — QUANTIFERON-TB GOLD PLUS

## 2021-05-18 LAB — CMV IGM

## 2021-05-18 LAB — EPSTEIN-BARR VIRUS NUCLEAR ANTIGEN ANTIBODY, IGG

## 2021-06-13 ENCOUNTER — Other Ambulatory Visit: Payer: Self-pay

## 2021-06-13 ENCOUNTER — Ambulatory Visit
Admission: RE | Admit: 2021-06-13 | Discharge: 2021-06-13 | Disposition: A | Discharge status: Home / Self Care | Payer: 59 | Source: Ambulatory Visit | Attending: Registered Nurse | Admitting: Registered Nurse

## 2021-06-13 ENCOUNTER — Other Ambulatory Visit: Payer: Self-pay | Admitting: Registered Nurse

## 2021-06-13 DIAGNOSIS — N6459 Other signs and symptoms in breast: Secondary | ICD-10-CM

## 2021-06-30 ENCOUNTER — Other Ambulatory Visit: Payer: Self-pay | Admitting: Obstetrics and Gynecology

## 2021-06-30 DIAGNOSIS — R928 Other abnormal and inconclusive findings on diagnostic imaging of breast: Secondary | ICD-10-CM

## 2021-07-17 ENCOUNTER — Other Ambulatory Visit: Payer: Self-pay | Admitting: Registered Nurse

## 2021-07-17 ENCOUNTER — Other Ambulatory Visit (HOSPITAL_BASED_OUTPATIENT_CLINIC_OR_DEPARTMENT_OTHER): Payer: Self-pay

## 2021-07-17 DIAGNOSIS — E559 Vitamin D deficiency, unspecified: Secondary | ICD-10-CM

## 2021-07-17 DIAGNOSIS — F988 Other specified behavioral and emotional disorders with onset usually occurring in childhood and adolescence: Secondary | ICD-10-CM

## 2021-07-17 MED ORDER — AMPHETAMINE-DEXTROAMPHET ER 10 MG PO CP24
10.0000 mg | ORAL_CAPSULE | Freq: Every day | ORAL | 0 refills | Status: DC
  Filled 2021-08-25: qty 30, 30d supply, fill #0

## 2021-07-17 MED ORDER — AMPHETAMINE-DEXTROAMPHET ER 10 MG PO CP24: 10.0000 mg | ORAL_CAPSULE | Freq: Every day | ORAL | 0 refills | Status: AC

## 2021-07-17 MED ORDER — VITAMIN D (ERGOCALCIFEROL) 1.25 MG (50000 UNIT) PO CAPS
50000.0000 [IU] | ORAL_CAPSULE | ORAL | 0 refills | Status: DC
  Filled 2021-07-17 – 2021-07-19 (×2): qty 4, 28d supply, fill #0
  Filled 2021-07-27: qty 8, 56d supply, fill #1
  Filled 2021-08-09: qty 4, 28d supply, fill #1
  Filled 2021-09-06 – 2021-09-21 (×2): qty 4, 28d supply, fill #2

## 2021-07-17 MED ORDER — AMPHETAMINE-DEXTROAMPHET ER 10 MG PO CP24
10.0000 mg | ORAL_CAPSULE | Freq: Every day | ORAL | 0 refills | Status: DC
Start: 2021-07-17 — End: 2021-12-13
  Filled 2021-07-17: qty 30, 30d supply, fill #0

## 2021-07-17 NOTE — Telephone Encounter (Signed)
Patient is requesting a refill of the following medications: Requested Prescriptions   Pending Prescriptions Disp Refills   amphetamine-dextroamphetamine (ADDERALL XR) 10 MG 24 hr capsule 30 capsule 0    Sig: Take 1 capsule (10 mg total) by mouth daily.   amphetamine-dextroamphetamine (ADDERALL XR) 10 MG 24 hr capsule 30 capsule 0    Sig: Take 1 capsule (10 mg total) by mouth daily.   amphetamine-dextroamphetamine (ADDERALL XR) 10 MG 24 hr capsule 30 capsule 0    Sig: Take 1 capsule (10 mg total) by mouth daily.   Vitamin D, Ergocalciferol, (DRISDOL) 1.25 MG (50000 UNIT) CAPS capsule 12 capsule 0    Sig: Take 1 capsule (50,000 Units total) by mouth every 7 (seven) days.    Date of patient request: 07/17/2021 Last office visit:05/04/2021 Date of last refill: 9/8/82022 Last refill amount: 30 capsules Follow up time period per chart: 08/22/2021

## 2021-07-19 ENCOUNTER — Other Ambulatory Visit (HOSPITAL_BASED_OUTPATIENT_CLINIC_OR_DEPARTMENT_OTHER): Payer: Self-pay

## 2021-07-27 ENCOUNTER — Other Ambulatory Visit: Payer: Self-pay

## 2021-07-27 ENCOUNTER — Telehealth: Payer: 59 | Admitting: Registered Nurse

## 2021-07-27 ENCOUNTER — Other Ambulatory Visit (HOSPITAL_BASED_OUTPATIENT_CLINIC_OR_DEPARTMENT_OTHER): Payer: Self-pay

## 2021-07-27 DIAGNOSIS — B9689 Other specified bacterial agents as the cause of diseases classified elsewhere: Secondary | ICD-10-CM | POA: Diagnosis not present

## 2021-07-27 DIAGNOSIS — R21 Rash and other nonspecific skin eruption: Secondary | ICD-10-CM | POA: Insufficient documentation

## 2021-07-27 DIAGNOSIS — S43439A Superior glenoid labrum lesion of unspecified shoulder, initial encounter: Secondary | ICD-10-CM | POA: Insufficient documentation

## 2021-07-27 DIAGNOSIS — M25519 Pain in unspecified shoulder: Secondary | ICD-10-CM | POA: Insufficient documentation

## 2021-07-27 DIAGNOSIS — J988 Other specified respiratory disorders: Secondary | ICD-10-CM

## 2021-07-27 DIAGNOSIS — J329 Chronic sinusitis, unspecified: Secondary | ICD-10-CM | POA: Insufficient documentation

## 2021-07-27 DIAGNOSIS — R1310 Dysphagia, unspecified: Secondary | ICD-10-CM | POA: Insufficient documentation

## 2021-07-27 DIAGNOSIS — E538 Deficiency of other specified B group vitamins: Secondary | ICD-10-CM | POA: Insufficient documentation

## 2021-07-27 MED ORDER — DOXYCYCLINE HYCLATE 100 MG PO TABS
100.0000 mg | ORAL_TABLET | Freq: Two times a day (BID) | ORAL | 0 refills | Status: DC
  Filled 2021-07-27: qty 20, 10d supply, fill #0

## 2021-07-27 MED ORDER — PREDNISONE 10 MG PO TABS
ORAL_TABLET | ORAL | 0 refills | Status: AC
  Filled 2021-07-27: qty 30, 12d supply, fill #0

## 2021-07-27 MED ORDER — BENZONATATE 100 MG PO CAPS
100.0000 mg | ORAL_CAPSULE | Freq: Two times a day (BID) | ORAL | 0 refills | Status: DC | PRN
Start: 2021-07-27 — End: 2021-10-04
  Filled 2021-07-27: qty 20, 10d supply, fill #0

## 2021-07-27 NOTE — Progress Notes (Signed)
I connected with  Shelby Norton on 07/27/21 by a video enabled telemedicine application and verified that I am speaking with the correct person using two identifiers.   I discussed the limitations of evaluation and management by telemedicine. The patient expressed understanding and agreed to proceed.

## 2021-07-27 NOTE — Progress Notes (Signed)
Telemedicine Encounter- SOAP NOTE Established Patient  This video encounter was conducted with the patient's (or proxy's) verbal consent via audio telecommunications: yes/no: Yes Patient was instructed to have this encounter in a suitably private space; and to only have persons present to whom they give permission to participate. In addition, patient identity was confirmed by use of name plus two identifiers (DOB and address).  I discussed the limitations, risks, security and privacy concerns of performing an evaluation and management service by telephone and the availability of in person appointments. I also discussed with the patient that there may be a patient responsible charge related to this service. The patient expressed understanding and agreed to proceed.  I spent a total of 16 mintues talking with the patient or their proxy.  Patient at home Provider in office  Participants: Shelby Sportsman, NP and Shelby Norton  Chief Complaint  Patient presents with   Covid Positive    Subjective   Shelby Norton is a 40 y.o. established patient. Video visit today for COVID+  HPI COVID+ Tested positive 2-2.5 weeks ago. OTC supportive care  - cold meds and antipyretics Had seemed to see some improvement Initially did not have any cough, just some sneezing and congestion, loss of taste and smell. Some high fevers.  About a week later, cough came back strongly, excessive congestion Has severe nasal and sinus congestion in frontal and maxillary.  Has continued with decongestants and expectorants but no relief from congestion  Last time she had an illness like this, it wound up as PNA.   Patient Active Problem List   Diagnosis Date Noted   Dysphagia 07/27/2021   Pain in joint, shoulder region 07/27/2021   Rash and other nonspecific skin eruption 07/27/2021   Sinusitis 07/27/2021   Superior glenoid labrum lesion 07/27/2021   Vitamin B12 deficiency 07/27/2021   Vitamin D deficiency  06/16/2020   Prediabetes 05/05/2020   Stress 05/05/2020   Class 1 obesity with serious comorbidity and body mass index (BMI) of 31.0 to 31.9 in adult 05/05/2020   Attention deficit disorder 05/03/2020   History of nephrolithiasis 04/05/2020   Pelvic pain 12/22/2017   SVD (spontaneous vaginal delivery) / [name] 11/21 09/12/2017   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   S/P ACL reconstruction 01/04/2016   Raynaud's phenomenon 03/16/2015   Sjogren's syndrome (HCC) 03/16/2015   Mitral insufficiency and aortic stenosis 08/20/2014   Migraine 05/25/2014   Rhabdomyolysis 01/08/2014   Mild intermittent asthma without complication 10/02/2013   Allergic rhinitis 01/01/2013    Past Medical History:  Diagnosis Date   Allergy    Anemia    Complication of anesthesia    per pt hard to wake   GERD (gastroesophageal reflux disease)    Heart murmur    Hematuria    History of exercise stress test    ETT 03-09-2016 in careeverywhere in epic-- result negative   History of gestational hypertension 07/2017   preeclamptic   History of kidney stones    History of pre-eclampsia    10/ 2018;  2010   History of rhabdomyolysis 12/2013   History of supraventricular tachycardia    per pt hx one episode , last halter montior  05/ 2017 note svt ectopy x4 in careeverywhere from [facility] in [place]   HSV-1 (herpes simplex virus 1) infection    Lactose intolerance    Migraine    Migraines    Mild asthma    prn inhaler   Mitral valve  prolapse    per pt dx mvp yrs ago and has been followed cardiologist---  [facility] [place].  in careeverywhere   MTHFR mutation    Neuromuscular disorder (HCC)    Palpitations    Raynauds syndrome    took procardia   Rheumatic fever    Swallowing difficulty    Wears contact lenses     Current Outpatient Medications  Medication Sig Dispense Refill   albuterol (PROVENTIL HFA;VENTOLIN HFA) 108 (90  Base) MCG/ACT inhaler Inhale 1-2 puffs into the lungs every 6 (six) hours as needed for wheezing or shortness of breath.     [START ON 08/14/2021] amphetamine-dextroamphetamine (ADDERALL XR) 10 MG 24 hr capsule Take 1 capsule (10 mg total) by mouth daily. 30 capsule 0   benzonatate (TESSALON) 100 MG capsule Take 1 capsule (100 mg total) by mouth 2 (two) times daily as needed for cough. 20 capsule 0   doxycycline (VIBRA-TABS) 100 MG tablet Take 1 tablet (100 mg total) by mouth 2 (two) times daily. 20 tablet 0   EPINEPHrine 0.3 mg/0.3 mL IJ SOAJ injection Inject 0.3 mg into the skin daily as needed. For allergic reaction     metFORMIN (GLUCOPHAGE) 500 MG tablet Take 1 tablet (500 mg total) by mouth daily. 90 tablet 1   predniSONE (DELTASONE) 10 MG tablet Take 4 tablets by mouth daily with breakfast for 3 days, THEN 3 tablets daily with breakfast for 3 days, THEN 2 tablets daily with breakfast for 3 days, THEN 1 tablet daily with breakfast for 3 days. 30 tablet 0   Probiotic Product (PROBIOTIC PO) Take 2 capsules by mouth daily.     topiramate (TOPAMAX) 25 MG tablet Take 1 tablet (25 mg total) by mouth daily. 90 tablet 1   vitamin C (ASCORBIC ACID) 500 MG tablet Take 500 mg by mouth daily.     Vitamin D, Ergocalciferol, (DRISDOL) 1.25 MG (50000 UNIT) CAPS capsule Take 1 capsule (50,000 Units total) by mouth every 7 (seven) days. 12 capsule 0   zinc gluconate 50 MG tablet Take 50 mg by mouth daily.     [START ON 09/11/2021] amphetamine-dextroamphetamine (ADDERALL XR) 10 MG 24 hr capsule Take 1 capsule (10 mg total) by mouth daily. (Patient not taking: Reported on 07/27/2021) 30 capsule 0   amphetamine-dextroamphetamine (ADDERALL XR) 10 MG 24 hr capsule Take 1 capsule (10 mg total) by mouth daily. (Patient not taking: Reported on 07/27/2021) 30 capsule 0   MAGNESIUM MALATE PO Take 1,000 mg by mouth daily. (Patient not taking: Reported on 07/27/2021)     No current facility-administered medications for this  visit.    Allergies  Allergen Reactions   Aspirin Hives   Other Anaphylaxis    hazelnuts   Codeine Nausea And Vomiting and Swelling   Hydrocodone Nausea And Vomiting and Swelling   Sulfamethoxazole Nausea And Vomiting   Betamethasone Rash    On face and neck    Social History   Socioeconomic History   Marital status: Married    Spouse name: Not on file   Number of children: 2   Years of education: Not on file   Highest education level: Not on file  Occupational History   Occupation: RN  Tobacco Use   Smoking status: Never   Smokeless tobacco: Never  Vaping Use   Vaping Use: Never used  Substance and Sexual Activity   Alcohol use: Yes    Comment: occ   Drug use: No  Sexual activity: Yes    Birth control/protection: None  Other Topics Concern   Not on file  Social History Narrative   Not on file   Social Determinants of Health   Financial Resource Strain: Not on file  Food Insecurity: Not on file  Transportation Needs: Not on file  Physical Activity: Not on file  Stress: Not on file  Social Connections: Not on file  Intimate Partner Violence: Not on file    Review of Systems  Constitutional:  Positive for fever and malaise/fatigue.  HENT:  Positive for congestion, sinus pain and sore throat. Negative for ear discharge, ear pain, hearing loss, nosebleeds and tinnitus.   Eyes: Negative.   Respiratory:  Positive for cough and sputum production. Negative for hemoptysis, shortness of breath, wheezing and stridor.   Cardiovascular: Negative.   Gastrointestinal: Negative.   Genitourinary: Negative.   Musculoskeletal: Negative.   Skin: Negative.   Neurological: Negative.   Endo/Heme/Allergies: Negative.   Psychiatric/Behavioral: Negative.    All other systems reviewed and are negative.  Objective   Vitals as reported by the patient: There were no vitals filed for this visit.  Dhara was seen today for covid positive.  Diagnoses and all orders for this  visit:  Bacterial respiratory infection -     doxycycline (VIBRA-TABS) 100 MG tablet; Take 1 tablet (100 mg total) by mouth 2 (two) times daily. -     predniSONE (DELTASONE) 10 MG tablet; Take 4 tablets by mouth daily with breakfast for 3 days, THEN 3 tablets daily with breakfast for 3 days, THEN 2 tablets daily with breakfast for 3 days, THEN 1 tablet daily with breakfast for 3 days. -     benzonatate (TESSALON) 100 MG capsule; Take 1 capsule (100 mg total) by mouth 2 (two) times daily as needed for cough.   PLAN Doxycycline and prednisone as above. Tessalon as supportive care for cough Discussed nonpharm, reasons to return to clinic, reasons to seek ER care. Pt voices understanding Patient encouraged to call clinic with any questions, comments, or concerns.  I discussed the assessment and treatment plan with the patient. The patient was provided an opportunity to ask questions and all were answered. The patient agreed with the plan and demonstrated an understanding of the instructions.   The patient was advised to call back or seek an in-person evaluation if the symptoms worsen or if the condition fails to improve as anticipated.  I provided 16 minutes of face-to-face time during this encounter.  Janeece Agee, NP

## 2021-08-01 ENCOUNTER — Encounter: Payer: Self-pay | Admitting: Registered Nurse

## 2021-08-02 ENCOUNTER — Encounter: Payer: Self-pay | Admitting: Registered Nurse

## 2021-08-09 ENCOUNTER — Other Ambulatory Visit (HOSPITAL_BASED_OUTPATIENT_CLINIC_OR_DEPARTMENT_OTHER): Payer: Self-pay

## 2021-08-16 ENCOUNTER — Encounter: Payer: Self-pay | Admitting: Registered Nurse

## 2021-08-16 ENCOUNTER — Ambulatory Visit: Payer: 59 | Admitting: Registered Nurse

## 2021-08-16 ENCOUNTER — Other Ambulatory Visit (HOSPITAL_BASED_OUTPATIENT_CLINIC_OR_DEPARTMENT_OTHER): Payer: Self-pay

## 2021-08-16 ENCOUNTER — Other Ambulatory Visit: Payer: Self-pay

## 2021-08-16 VITALS — BP 126/70 | HR 85 | Temp 98.3°F | Resp 18 | Ht 65.0 in | Wt 192.2 lb

## 2021-08-16 DIAGNOSIS — F411 Generalized anxiety disorder: Secondary | ICD-10-CM | POA: Diagnosis not present

## 2021-08-16 DIAGNOSIS — F430 Acute stress reaction: Secondary | ICD-10-CM

## 2021-08-16 MED ORDER — BUPROPION HCL ER (SR) 150 MG PO TB12
150.0000 mg | ORAL_TABLET | Freq: Two times a day (BID) | ORAL | 1 refills | Status: DC
  Filled 2021-08-16: qty 60, 30d supply, fill #0
  Filled 2021-09-11: qty 60, 30d supply, fill #1

## 2021-08-16 MED ORDER — TRIAMCINOLONE ACETONIDE 0.1 % EX CREA
1.0000 "application " | TOPICAL_CREAM | Freq: Two times a day (BID) | CUTANEOUS | 0 refills | Status: DC
  Filled 2021-08-16: qty 30, 14d supply, fill #0

## 2021-08-16 MED ORDER — HYDROXYZINE HCL 10 MG PO TABS
5.0000 mg | ORAL_TABLET | Freq: Three times a day (TID) | ORAL | 1 refills | Status: DC | PRN
  Filled 2021-08-16: qty 60, 10d supply, fill #0

## 2021-08-16 NOTE — Patient Instructions (Signed)
Ms. Sondra Blixt to see you, sorry about the rash!  Hydroxyzine 0.5 tabs to 2 tabs tid prn. Triamcinolone everywhere except face and groin for topical relief.  Start Wellbutrin once daily in mornings. Can increase to twice daily after 2-3 weeks if doing well. Let's touch base on this in 6-8 weeks. Will be effective if taken every day.  Let me know if you have concerns in the interim.  Thanks,  Luan Pulling

## 2021-08-16 NOTE — Progress Notes (Signed)
Established Patient Office Visit  Subjective:  Patient ID: Shelby Norton, female    DOB: Aug 07, 1981  Age: 40 y.o. MRN: [id number]  CC:  Chief Complaint  Patient presents with   Rash    Patient states she has developed a rash on her face that is now spreading on her face , chest, stomach and back..patient states she is taking cream and benadryl with not much relief.    HPI Shelby Norton presents for rash  Onset over weekend - Friday into Saturday.  Started on face - bridge of nose and cheeks and down R jaw, then up across forehead.  Spread down to chest and below breasts and toward stomach.  At first pt suspected that this was related to coming off of the steroid she had been on for COVID on 07/27/21 - 9 day taper of prednisone.  Notes face has cleared up significantly.  Itchiest spot was R cheek, jawline, and neck  A lot of stress lately. Had a course of covid with subsequent bacterial infection requiring abx and prednisone  Unfortunately had to attend a funeral in [place] unexpectedly last week a lot of stress, traveling with children  She is in counseling both independently and with her husband.   Past Medical History:  Diagnosis Date   Allergy    Anemia    Complication of anesthesia    per pt hard to wake   GERD (gastroesophageal reflux disease)    Heart murmur    Hematuria    History of exercise stress test    ETT 03-09-2016 in careeverywhere in epic-- result negative   History of gestational hypertension 07/2017   preeclamptic   History of kidney stones    History of pre-eclampsia    10/ 2018;  2010   History of rhabdomyolysis 12/2013   History of supraventricular tachycardia    per pt hx one episode , last halter montior  05/ 2017 note svt ectopy x4 in careeverywhere from [facility] in [place]   HSV-1 (herpes simplex virus 1) infection    Lactose intolerance    Migraine    Migraines    Mild asthma    prn inhaler   Mitral valve prolapse    per pt dx mvp  yrs ago and has been followed cardiologist---  last office visit 05/ 2017 [facility] in [place].  in [place]   MTHFR mutation    Neuromuscular disorder ([place])    Palpitations    Raynauds syndrome    took procardia   Rheumatic fever    Swallowing difficulty    Wears contact lenses     Past Surgical History:  Procedure Laterality Date   ANTERIOR CRUCIATE LIGAMENT REPAIR Right 10/2015   CYSTOSCOPY/URETEROSCOPY/HOLMIUM LASER/STENT PLACEMENT Bilateral 12/24/2017   Procedure: CYSTOSCOPY/RETROGRADE/URETEROSCOPY/HOLMIUM LASER LITHOTRIPSY, STONE BASKETRY /STENT PLACEMENT;  Surgeon: Ceasar Mons, MD;  Location: [facility];  Service: Urology;  Laterality: Bilateral;   HOLMIUM LASER APPLICATION Bilateral 0/12/5007   Procedure: HOLMIUM LASER APPLICATION;  Surgeon: Ceasar Mons, MD;  Location: [facility];  Service: Urology;  Laterality: Bilateral;   KIDNEY STONE SURGERY     NASAL SEPTUM SURGERY  02/2013   NASAL SINUS SURGERY     TOE SOFT TISSUE RELEASE W/ PINNING  2020   5th toe   TONSILLECTOMY  1991   WISDOM TOOTH EXTRACTION      Family History  Problem Relation Age of Onset   Diabetes Mother    Hypertension Mother  Hyperlipidemia Mother    Obesity Mother    Diabetes Father    Hypertension Father    Other Father        TIA   Stroke Father    Heart disease Father    Kidney disease Father    Liver disease Father    Alcoholism Father    Obesity Father    Obstructive Sleep Apnea Father    Diabetes Brother    Lung cancer Maternal Grandmother        chronic secondhand smoke exposure   Brain cancer Maternal Grandmother    Breast cancer Maternal Grandmother    Stroke Paternal Grandfather     Social History   Socioeconomic History   Marital status: Married    Spouse name: Not on file   Number of children: 2   Years of education: Not on file   Highest education level: Not on file  Occupational History    Occupation: RN  Tobacco Use   Smoking status: Never   Smokeless tobacco: Never  Vaping Use   Vaping Use: Never used  Substance and Sexual Activity   Alcohol use: Yes    Comment: occ   Drug use: No   Sexual activity: Yes    Birth control/protection: None  Other Topics Concern   Not on file  Social History Narrative   Not on file   Social Determinants of Health   Financial Resource Strain: Not on file  Food Insecurity: Not on file  Transportation Needs: Not on file  Physical Activity: Not on file  Stress: Not on file  Social Connections: Not on file  Intimate Partner Violence: Not on file    Outpatient Medications Prior to Visit  Medication Sig Dispense Refill   albuterol (PROVENTIL HFA;VENTOLIN HFA) 108 (90 Base) MCG/ACT inhaler Inhale 1-2 puffs into the lungs every 6 (six) hours as needed for wheezing or shortness of breath.     amphetamine-dextroamphetamine (ADDERALL XR) 10 MG 24 hr capsule Take 1 capsule (10 mg total) by mouth daily. 30 capsule 0   EPINEPHrine 0.3 mg/0.3 mL IJ SOAJ injection Inject 0.3 mg into the skin daily as needed. For allergic reaction     metFORMIN (GLUCOPHAGE) 500 MG tablet Take 1 tablet (500 mg total) by mouth daily. 90 tablet 1   Probiotic Product (PROBIOTIC PO) Take 2 capsules by mouth daily.     topiramate (TOPAMAX) 25 MG tablet Take 1 tablet (25 mg total) by mouth daily. 90 tablet 1   vitamin C (ASCORBIC ACID) 500 MG tablet Take 500 mg by mouth daily.     Vitamin D, Ergocalciferol, (DRISDOL) 1.25 MG (50000 UNIT) CAPS capsule Take 1 capsule (50,000 Units total) by mouth every 7 (seven) days. 12 capsule 0   zinc gluconate 50 MG tablet Take 50 mg by mouth daily.     [START ON 09/11/2021] amphetamine-dextroamphetamine (ADDERALL XR) 10 MG 24 hr capsule Take 1 capsule (10 mg total) by mouth daily. (Patient not taking: No sig reported) 30 capsule 0   amphetamine-dextroamphetamine (ADDERALL XR) 10 MG 24 hr capsule Take 1 capsule (10 mg total) by mouth  daily. (Patient not taking: No sig reported) 30 capsule 0   benzonatate (TESSALON) 100 MG capsule Take 1 capsule (100 mg total) by mouth 2 (two) times daily as needed for cough. 20 capsule 0   doxycycline (VIBRA-TABS) 100 MG tablet Take 1 tablet (100 mg total) by mouth 2 (two) times daily. 20 tablet 0   MAGNESIUM MALATE PO Take 1,000  mg by mouth daily. (Patient not taking: No sig reported)     No facility-administered medications prior to visit.    Allergies  Allergen Reactions   Aspirin Hives   Other Anaphylaxis    hazelnuts   Codeine Nausea And Vomiting and Swelling   Hydrocodone Nausea And Vomiting and Swelling   Sulfamethoxazole Nausea And Vomiting   Betamethasone Rash    On face and neck    ROS Review of Systems  Constitutional: Negative.   HENT: Negative.    Eyes: Negative.   Respiratory: Negative.    Cardiovascular: Negative.   Gastrointestinal: Negative.   Genitourinary: Negative.   Musculoskeletal: Negative.   Skin:  Positive for rash. Negative for color change, pallor and wound.  Neurological: Negative.   Psychiatric/Behavioral:  The patient is nervous/anxious.   All other systems reviewed and are negative.    Objective:    Physical Exam Vitals and nursing note reviewed.  Constitutional:      General: She is not in acute distress.    Appearance: Normal appearance. She is normal weight. She is not ill-appearing, toxic-appearing or diaphoretic.  Cardiovascular:     Rate and Rhythm: Normal rate and regular rhythm.     Heart sounds: Normal heart sounds. No murmur heard.   No friction rub. No gallop.  Pulmonary:     Effort: Pulmonary effort is normal. No respiratory distress.     Breath sounds: Normal breath sounds. No stridor. No wheezing, rhonchi or rales.  Chest:     Chest wall: No tenderness.  Skin:    General: Skin is warm and dry.  Neurological:     General: No focal deficit present.     Mental Status: She is alert and oriented to person, place, and  time. Mental status is at baseline.  Psychiatric:        Mood and Affect: Mood normal.        Behavior: Behavior normal.        Thought Content: Thought content normal.        Judgment: Judgment normal.    BP 126/70   Pulse 85   Temp 98.3 F (36.8 C) (Temporal)   Resp 18   Ht _0  (1.651 m)   Wt 192 lb 3.2 oz (87.2 kg)   BMI 31.98 kg/m  Wt Readings from Last 3 Encounters:  08/16/21 192 lb 3.2 oz (87.2 kg)  05/04/21 185 lb 3.2 oz (84 kg)  02/22/21 188 lb (85.3 kg)     Health Maintenance Due  Topic Date Due   PAP SMEAR-Modifier  Never done   COVID-19 Vaccine (3 - Booster for Pfizer series) 02/02/2020    There are no preventive care reminders to display for this patient.  Lab Results  Component Value Date   TSH 1.36 05/11/2021   Lab Results  Component Value Date   WBC 8.5 05/11/2021   HGB 13.7 05/11/2021   HCT 40.5 05/11/2021   MCV 85.4 05/11/2021   PLT 339.0 05/11/2021   Lab Results  Component Value Date   NA 138 05/11/2021   K 4.3 05/11/2021   CO2 22 05/11/2021   GLUCOSE 102 (H) 05/11/2021   BUN 15 05/11/2021   CREATININE 0.73 05/11/2021   BILITOT 0.9 05/11/2021   ALKPHOS 94 05/11/2021   AST 29 05/11/2021   ALT 33 05/11/2021   PROT 6.2 05/11/2021   ALBUMIN 4.3 05/11/2021   CALCIUM 9.1 05/11/2021   ANIONGAP 11 06/05/2020   EGFR 100 05/04/2021   GFR  104.91 05/11/2021   Lab Results  Component Value Date   CHOL 236 (H) 02/22/2021   Lab Results  Component Value Date   HDL 77.40 02/22/2021   Lab Results  Component Value Date   LDLCALC 143 (H) 02/22/2021   Lab Results  Component Value Date   TRIG 78.0 02/22/2021   Lab Results  Component Value Date   CHOLHDL 3 02/22/2021   Lab Results  Component Value Date   HGBA1C 5.6 02/22/2021      Assessment & Plan:   Problem List Items Addressed This Visit   None Visit Diagnoses     GAD (generalized anxiety disorder)    -  Primary   Relevant Medications   hydrOXYzine (ATARAX/VISTARIL) 10 MG  tablet   buPROPion (WELLBUTRIN SR) 150 MG 12 hr tablet   Stress reaction       Relevant Medications   triamcinolone cream (KENALOG) 0.1 %   hydrOXYzine (ATARAX/VISTARIL) 10 MG tablet   buPROPion (WELLBUTRIN SR) 150 MG 12 hr tablet       Meds ordered this encounter  Medications   triamcinolone cream (KENALOG) 0.1 %    Sig: Apply 1 application topically 2 (two) times daily.    Dispense:  30 g    Refill:  0    Order Specific Question:   Supervising Provider    Answer:   Carlota Raspberry, JEFFREY R [[id number]]   hydrOXYzine (ATARAX/VISTARIL) 10 MG tablet    Sig: Take 1/2-2 tablets (5-20 mg total) by mouth 3 (three) times daily as needed.    Dispense:  60 tablet    Refill:  1    Order Specific Question:   Supervising Provider    Answer:   Carlota Raspberry, JEFFREY R [[id number]]   buPROPion (WELLBUTRIN SR) 150 MG 12 hr tablet    Sig: Take 1 tablet (150 mg total) by mouth 2 (two) times daily.    Dispense:  90 tablet    Refill:  1    Order Specific Question:   Supervising Provider    Answer:   Carlota Raspberry, JEFFREY R [[id number]]    Follow-up: Return in about 7 weeks (around 10/04/2021) for 6-8 week med check - bupropion.   PLAN Suspect stress reaction for rash. Will give triamcinolone and hydroxyzine. Start on bupropion for anxiety. Take once daily for 2-3 weeks then increase to bid Med check in 6-8 weeks. Patient encouraged to call clinic with any questions, comments, or concerns.  I spent 43 minutes with patient reviewing possible etiologies, discussing treatment plans, and forming follow up plan.   Maximiano Coss, NP

## 2021-08-17 ENCOUNTER — Other Ambulatory Visit (HOSPITAL_BASED_OUTPATIENT_CLINIC_OR_DEPARTMENT_OTHER): Payer: Self-pay

## 2021-08-22 ENCOUNTER — Ambulatory Visit: Payer: 59 | Admitting: Registered Nurse

## 2021-08-25 ENCOUNTER — Other Ambulatory Visit (HOSPITAL_BASED_OUTPATIENT_CLINIC_OR_DEPARTMENT_OTHER): Payer: Self-pay

## 2021-09-06 ENCOUNTER — Other Ambulatory Visit (HOSPITAL_BASED_OUTPATIENT_CLINIC_OR_DEPARTMENT_OTHER): Payer: Self-pay

## 2021-09-10 IMAGING — CR DG CHEST 2V
2 series · 2 of 2 positions shown · non-contrast
Comparison: None.

CLINICAL DATA: Chest pain

EXAM:
CHEST - 2 VIEW

[chest lat]
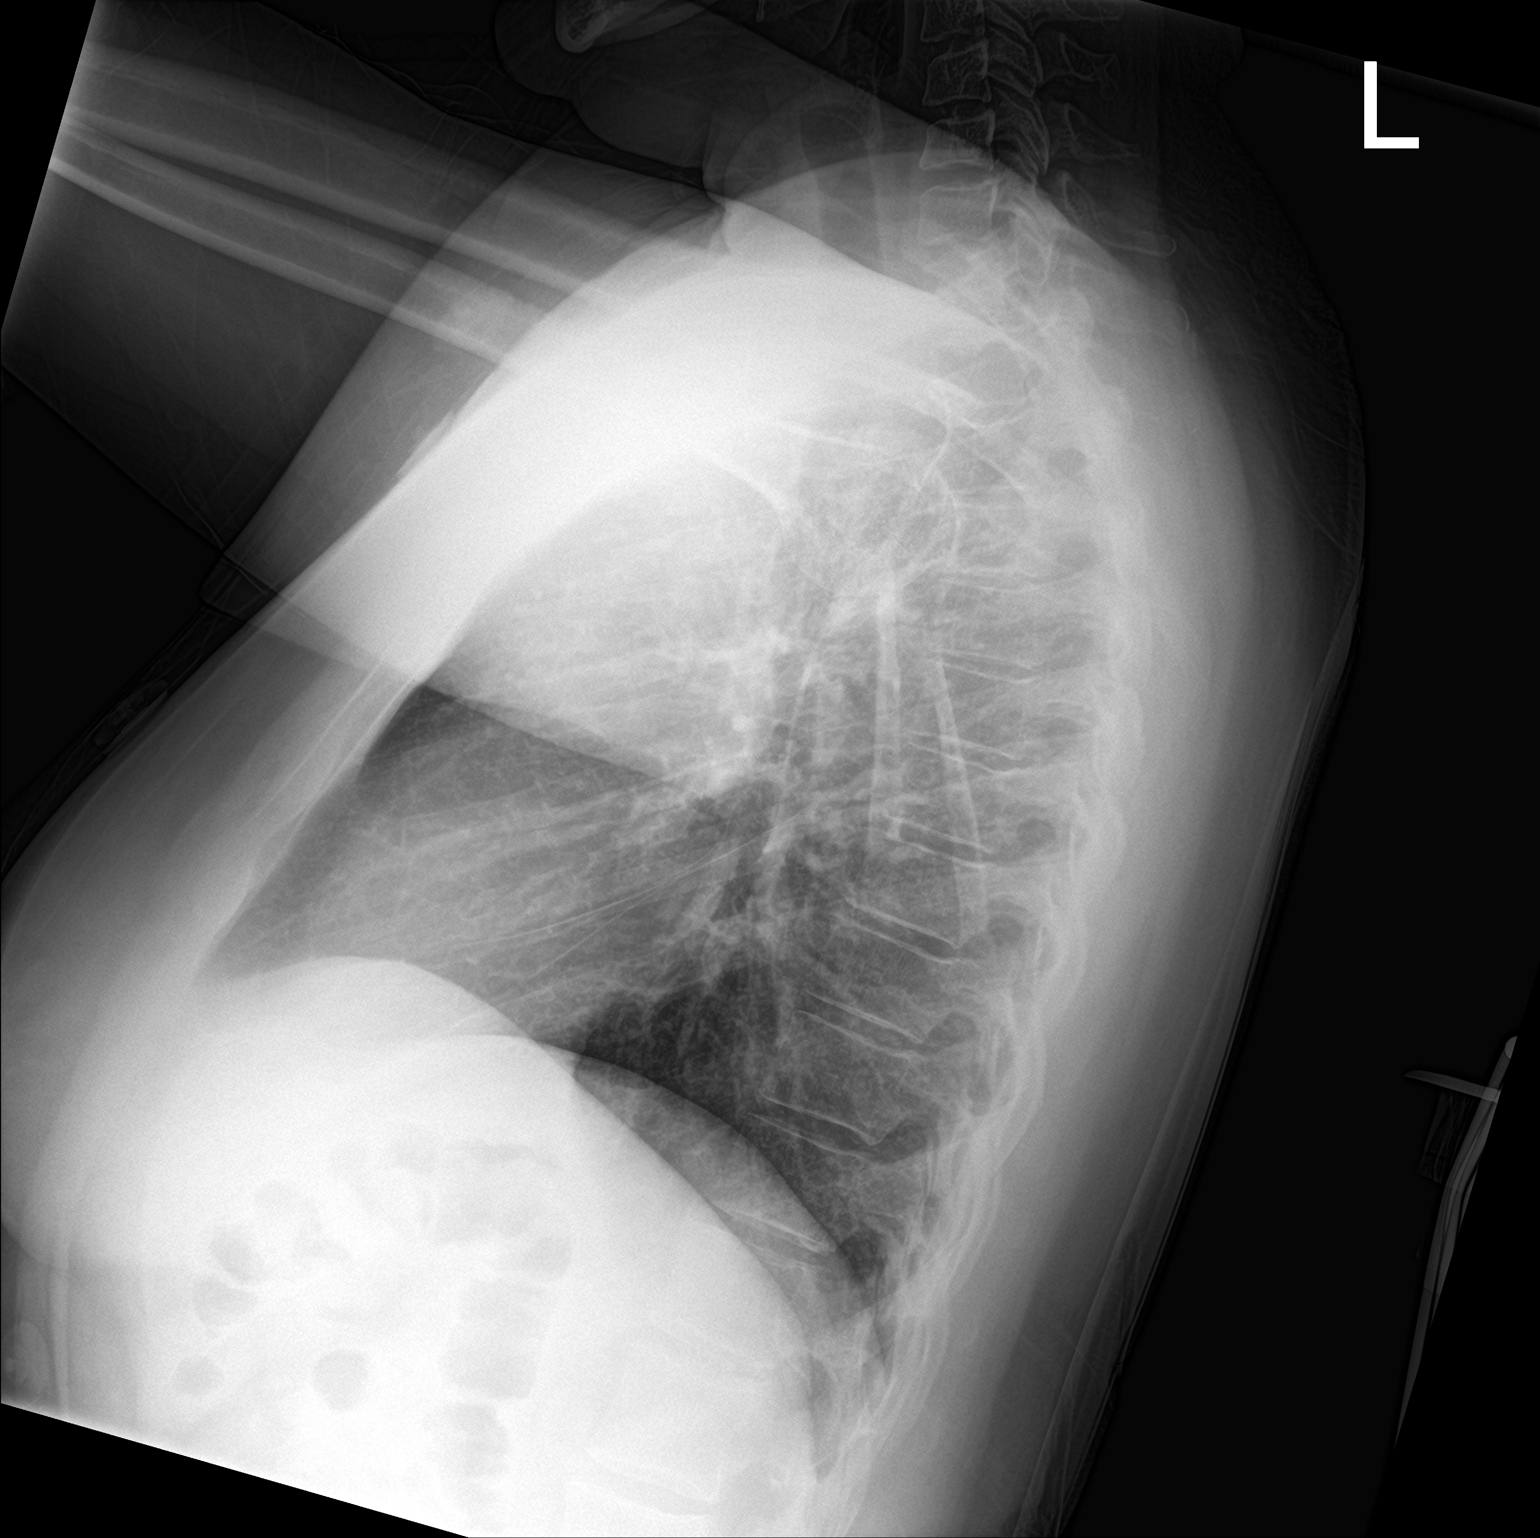

[chest ap]
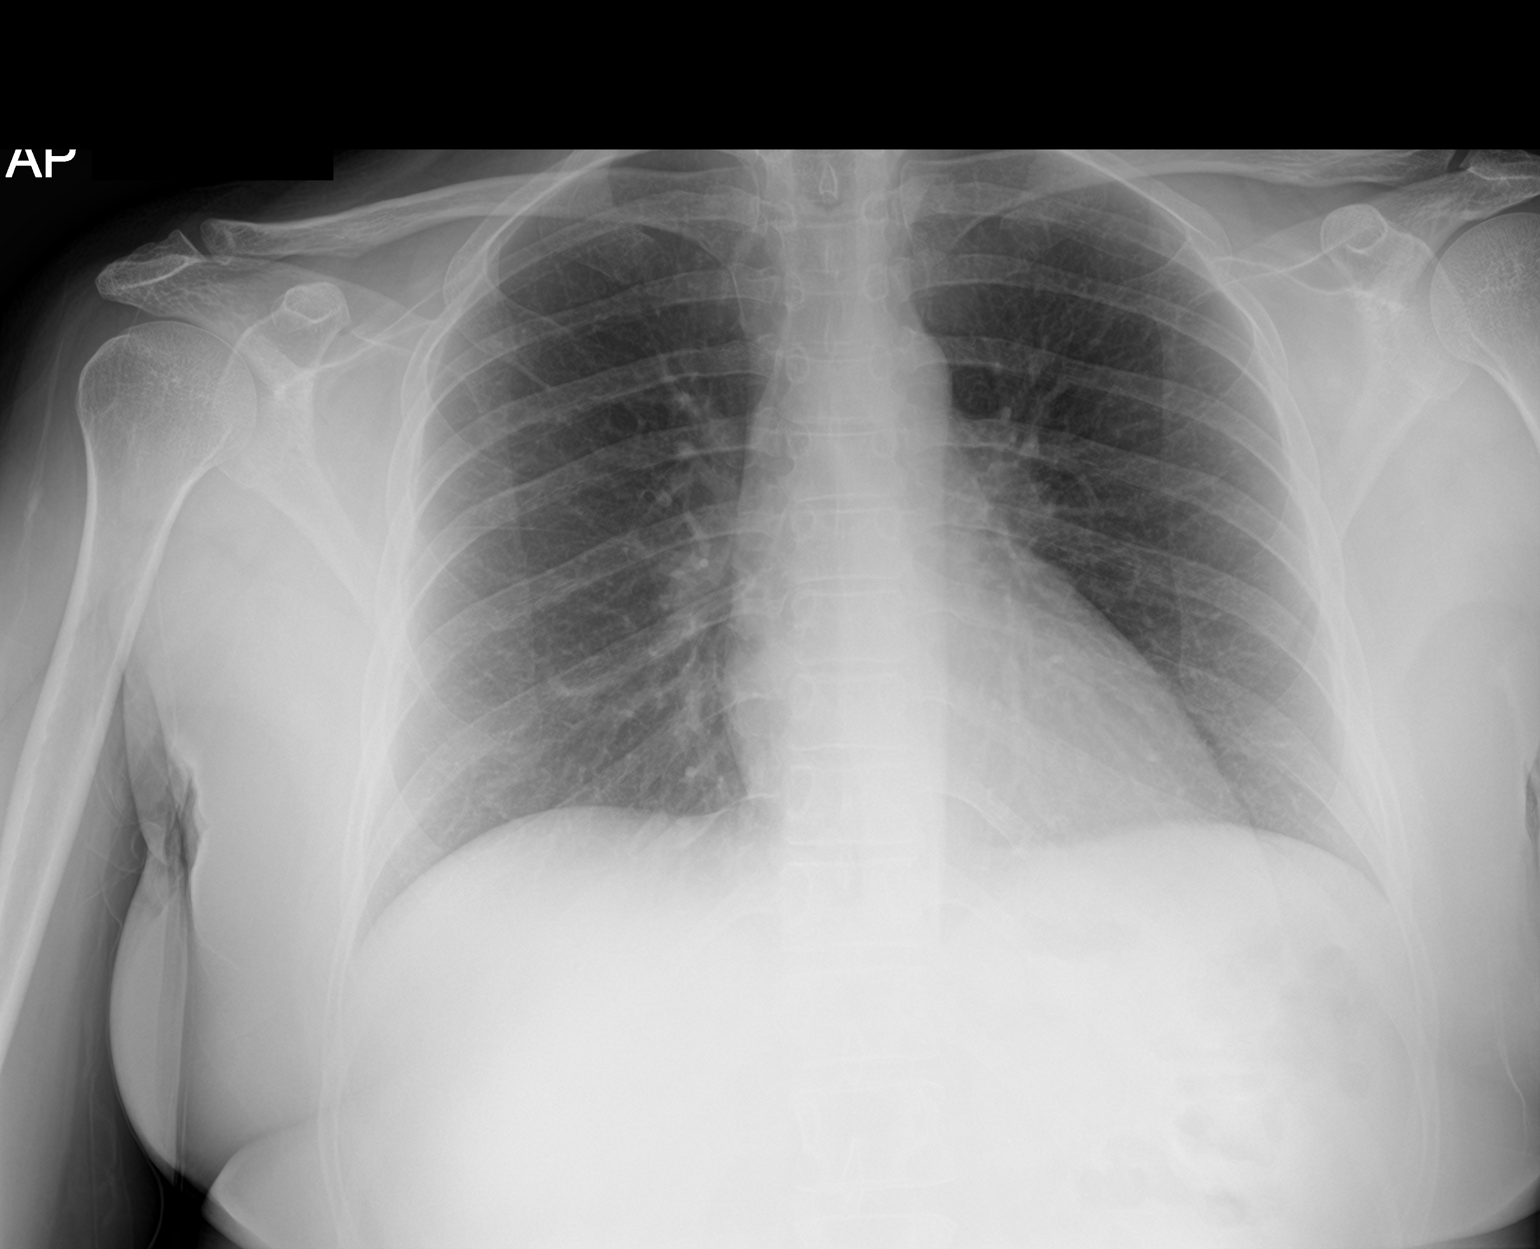

[2 of 2 positions shown; findings below may reference images not displayed]

FINDINGS: The heart size and mediastinal contours are within normal limits.
Both lungs are clear. The visualized skeletal structures are
unremarkable.
IMPRESSION: No active cardiopulmonary disease.

## 2021-09-11 ENCOUNTER — Other Ambulatory Visit (HOSPITAL_BASED_OUTPATIENT_CLINIC_OR_DEPARTMENT_OTHER): Payer: Self-pay

## 2021-09-11 MED ORDER — CEFDINIR 300 MG PO CAPS
ORAL_CAPSULE | ORAL | 0 refills | Status: DC
  Filled 2021-09-11 – 2021-09-12 (×2): qty 14, 7d supply, fill #0
  Filled 2021-09-13: qty 14, fill #0

## 2021-09-11 MED ORDER — PROMETHAZINE-DM 6.25-15 MG/5ML PO SYRP
ORAL_SOLUTION | ORAL | 0 refills | Status: DC
  Filled 2021-09-11: qty 118, 6d supply, fill #0
  Filled 2021-09-12: qty 118, 5d supply, fill #0
  Filled 2021-09-13: qty 118, fill #0

## 2021-09-11 MED ORDER — DOXYCYCLINE MONOHYDRATE 100 MG PO CAPS
ORAL_CAPSULE | ORAL | 0 refills | Status: DC
  Filled 2021-09-11: qty 14, 7d supply, fill #0
  Filled 2021-09-13: qty 14, fill #0

## 2021-09-12 ENCOUNTER — Other Ambulatory Visit (HOSPITAL_BASED_OUTPATIENT_CLINIC_OR_DEPARTMENT_OTHER): Payer: Self-pay

## 2021-09-13 ENCOUNTER — Other Ambulatory Visit (HOSPITAL_BASED_OUTPATIENT_CLINIC_OR_DEPARTMENT_OTHER): Payer: Self-pay

## 2021-09-20 ENCOUNTER — Other Ambulatory Visit (HOSPITAL_BASED_OUTPATIENT_CLINIC_OR_DEPARTMENT_OTHER): Payer: Self-pay

## 2021-09-21 ENCOUNTER — Other Ambulatory Visit (HOSPITAL_BASED_OUTPATIENT_CLINIC_OR_DEPARTMENT_OTHER): Payer: Self-pay

## 2021-09-26 ENCOUNTER — Other Ambulatory Visit (HOSPITAL_BASED_OUTPATIENT_CLINIC_OR_DEPARTMENT_OTHER): Payer: Self-pay

## 2021-10-04 ENCOUNTER — Other Ambulatory Visit (HOSPITAL_BASED_OUTPATIENT_CLINIC_OR_DEPARTMENT_OTHER): Payer: Self-pay

## 2021-10-04 ENCOUNTER — Ambulatory Visit: Payer: 59 | Admitting: Registered Nurse

## 2021-10-04 ENCOUNTER — Encounter: Payer: Self-pay | Admitting: Registered Nurse

## 2021-10-04 ENCOUNTER — Other Ambulatory Visit: Payer: Self-pay

## 2021-10-04 VITALS — BP 147/78 | HR 92 | Temp 98.4°F | Resp 18 | Ht 65.0 in | Wt 184.4 lb

## 2021-10-04 DIAGNOSIS — F411 Generalized anxiety disorder: Secondary | ICD-10-CM | POA: Diagnosis not present

## 2021-10-04 DIAGNOSIS — F410 Panic disorder [episodic paroxysmal anxiety] without agoraphobia: Secondary | ICD-10-CM

## 2021-10-04 MED ORDER — FLUOXETINE HCL 20 MG PO CAPS
20.0000 mg | ORAL_CAPSULE | Freq: Every day | ORAL | 3 refills | Status: DC
  Filled 2021-10-04: qty 30, 30d supply, fill #0
  Filled 2021-10-27: qty 30, 30d supply, fill #1
  Filled 2021-11-27 – 2021-12-13 (×2): qty 30, 30d supply, fill #2

## 2021-10-04 MED ORDER — CLONAZEPAM 0.5 MG PO TBDP
0.5000 mg | ORAL_TABLET | Freq: Two times a day (BID) | ORAL | 0 refills | Status: AC
  Filled 2021-10-04: qty 30, 15d supply, fill #0

## 2021-10-04 NOTE — Progress Notes (Signed)
Established Patient Office Visit  Subjective:  Patient ID: Shelby Norton, female    DOB: 09/13/1981  Age: 40 y.o. MRN: [id number]  CC:  Chief Complaint  Patient presents with   Follow-up    Patient state she is here to follow up on medication. Patient states she has been having some crazy dreams but has not noticed changed for her anxiety.    HPI Samreen K Upham presents for follow up on anxiety  Had started on bupropion around 7 weeks ago. Unfortunately limited in efficacy against anxiety AE of vivid and strange dreams/nightmares. Has labeled these as "anxiety dreams" in the past.  Notes she did have anxiety on a flight for the first time ever - has never been an issue for her in the past.  Had panic attacks on longer flights.   Past Medical History:  Diagnosis Date   Allergy    Anemia    Complication of anesthesia    per pt hard to wake   GERD (gastroesophageal reflux disease)    Heart murmur    Hematuria    History of exercise stress test    ETT 03-09-2016 in careeverywhere in epic-- result negative   History of gestational hypertension 07/2017   preeclamptic   History of kidney stones    History of pre-eclampsia    10/ 2018;  2010   History of rhabdomyolysis 12/2013   History of supraventricular tachycardia    per pt hx one episode , last halter montior  05/ 2017 note svt ectopy x4 in careeverywhere from [facility] in [place]   HSV-1 (herpes simplex virus 1) infection    Lactose intolerance    Migraine    Migraines    Mild asthma    prn inhaler   Mitral valve prolapse    per pt dx mvp yrs ago and has been followed cardiologist---  last office visit 05/ 2017 [facility] in [place].  in [place]   MTHFR mutation    Neuromuscular disorder ([place])    Palpitations    Raynauds syndrome    took procardia   Rheumatic fever    Swallowing difficulty    Wears contact lenses     Past Surgical History:  Procedure Laterality Date   ANTERIOR CRUCIATE  LIGAMENT REPAIR Right 10/2015   CYSTOSCOPY/URETEROSCOPY/HOLMIUM LASER/STENT PLACEMENT Bilateral 12/24/2017   Procedure: CYSTOSCOPY/RETROGRADE/URETEROSCOPY/HOLMIUM LASER LITHOTRIPSY, STONE BASKETRY /STENT PLACEMENT;  Surgeon: Ceasar Mons, MD;  Location: [facility];  Service: Urology;  Laterality: Bilateral;   HOLMIUM LASER APPLICATION Bilateral 02/26/3219   Procedure: HOLMIUM LASER APPLICATION;  Surgeon: Ceasar Mons, MD;  Location: [facility];  Service: Urology;  Laterality: Bilateral;   KIDNEY STONE SURGERY     NASAL SEPTUM SURGERY  02/2013   NASAL SINUS SURGERY     TOE SOFT TISSUE RELEASE W/ PINNING  2020   5th toe   TONSILLECTOMY  1991   WISDOM TOOTH EXTRACTION      Family History  Problem Relation Age of Onset   Diabetes Mother    Hypertension Mother    Hyperlipidemia Mother    Obesity Mother    Diabetes Father    Hypertension Father    Other Father        TIA   Stroke Father    Heart disease Father    Kidney disease Father    Liver disease Father    Alcoholism Father    Obesity Father    Obstructive Sleep Apnea Father  Diabetes Brother    Lung cancer Maternal Grandmother        chronic secondhand smoke exposure   Brain cancer Maternal Grandmother    Breast cancer Maternal Grandmother    Stroke Paternal Grandfather     Social History   Socioeconomic History   Marital status: Married    Spouse name: Not on file   Number of children: 2   Years of education: Not on file   Highest education level: Not on file  Occupational History   Occupation: RN  Tobacco Use   Smoking status: Never   Smokeless tobacco: Never  Vaping Use   Vaping Use: Never used  Substance and Sexual Activity   Alcohol use: Yes    Comment: occ   Drug use: No   Sexual activity: Yes    Birth control/protection: None  Other Topics Concern   Not on file  Social History Narrative   Not on file   Social Determinants of Health    Financial Resource Strain: Not on file  Food Insecurity: Not on file  Transportation Needs: Not on file  Physical Activity: Not on file  Stress: Not on file  Social Connections: Not on file  Intimate Partner Violence: Not on file    Outpatient Medications Prior to Visit  Medication Sig Dispense Refill   albuterol (PROVENTIL HFA;VENTOLIN HFA) 108 (90 Base) MCG/ACT inhaler Inhale 1-2 puffs into the lungs every 6 (six) hours as needed for wheezing or shortness of breath.     amphetamine-dextroamphetamine (ADDERALL XR) 10 MG 24 hr capsule Take 1 capsule (10 mg total) by mouth daily. 30 capsule 0   amphetamine-dextroamphetamine (ADDERALL XR) 10 MG 24 hr capsule Take 1 capsule (10 mg total) by mouth daily. (Patient not taking: No sig reported) 30 capsule 0   amphetamine-dextroamphetamine (ADDERALL XR) 10 MG 24 hr capsule Take 1 capsule (10 mg total) by mouth daily. (Patient not taking: No sig reported) 30 capsule 0   buPROPion (WELLBUTRIN SR) 150 MG 12 hr tablet Take 1 tablet (150 mg total) by mouth 2 (two) times daily. 90 tablet 1   EPINEPHrine 0.3 mg/0.3 mL IJ SOAJ injection Inject 0.3 mg into the skin daily as needed. For allergic reaction     Probiotic Product (PROBIOTIC PO) Take 2 capsules by mouth daily.     topiramate (TOPAMAX) 25 MG tablet Take 1 tablet (25 mg total) by mouth daily. 90 tablet 1   vitamin C (ASCORBIC ACID) 500 MG tablet Take 500 mg by mouth daily.     Vitamin D, Ergocalciferol, (DRISDOL) 1.25 MG (50000 UNIT) CAPS capsule Take 1 capsule (50,000 Units total) by mouth every 7 (seven) days. 12 capsule 0   zinc gluconate 50 MG tablet Take 50 mg by mouth daily.     benzonatate (TESSALON) 100 MG capsule Take 1 capsule (100 mg total) by mouth 2 (two) times daily as needed for cough. 20 capsule 0   cefdinir (OMNICEF) 300 MG capsule Take 1 capsule (300 mg total) by mouth 2 times daily for 7 days. 14 capsule 0   doxycycline (MONODOX) 100 MG capsule Take 1 capsule (100 mg total) by  mouth 2 times daily for 7 days. 14 capsule 0   doxycycline (VIBRA-TABS) 100 MG tablet Take 1 tablet (100 mg total) by mouth 2 (two) times daily. 20 tablet 0   hydrOXYzine (ATARAX/VISTARIL) 10 MG tablet Take 1/2-2 tablets (5-20 mg total) by mouth 3 (three) times daily as needed. 60 tablet 1   MAGNESIUM MALATE  PO Take 1,000 mg by mouth daily. (Patient not taking: No sig reported)     metFORMIN (GLUCOPHAGE) 500 MG tablet Take 1 tablet (500 mg total) by mouth daily. 90 tablet 1   promethazine-dextromethorphan (PROMETHAZINE-DM) 6.25-15 MG/5ML syrup Take 5 mLs by mouth 4 (four)  times daily as needed for up to 5 days for Cough. 118 mL 0   triamcinolone cream (KENALOG) 0.1 % Apply 1 application topically 2 (two) times daily. 30 g 0   No facility-administered medications prior to visit.    Allergies  Allergen Reactions   Aspirin Hives   Other Anaphylaxis    hazelnuts   Codeine Nausea And Vomiting and Swelling   Hydrocodone Nausea And Vomiting and Swelling   Sulfamethoxazole Nausea And Vomiting   Betamethasone Rash    On face and neck    ROS Review of Systems  Constitutional: Negative.   HENT: Negative.    Eyes: Negative.   Respiratory: Negative.    Cardiovascular: Negative.   Gastrointestinal: Negative.   Genitourinary: Negative.   Musculoskeletal: Negative.   Skin: Negative.   Neurological: Negative.   Psychiatric/Behavioral: Negative.    All other systems reviewed and are negative.    Objective:    Physical Exam Vitals and nursing note reviewed.  Constitutional:      General: She is not in acute distress.    Appearance: Normal appearance. She is normal weight. She is not ill-appearing, toxic-appearing or diaphoretic.  Cardiovascular:     Rate and Rhythm: Normal rate and regular rhythm.     Heart sounds: Normal heart sounds. No murmur heard.   No friction rub. No gallop.  Pulmonary:     Effort: Pulmonary effort is normal. No respiratory distress.     Breath sounds: Normal  breath sounds. No stridor. No wheezing, rhonchi or rales.  Chest:     Chest wall: No tenderness.  Skin:    General: Skin is warm and dry.  Neurological:     General: No focal deficit present.     Mental Status: She is alert and oriented to person, place, and time. Mental status is at baseline.  Psychiatric:        Mood and Affect: Mood normal.        Behavior: Behavior normal.        Thought Content: Thought content normal.        Judgment: Judgment normal.    BP (!) 147/78    Pulse 92    Temp 98.4 F (36.9 C) (Temporal)    Resp 18    Ht _0  (1.651 m)    Wt 184 lb 6.4 oz (83.6 kg)    SpO2 100%    BMI 30.69 kg/m  Wt Readings from Last 3 Encounters:  10/04/21 184 lb 6.4 oz (83.6 kg)  08/16/21 192 lb 3.2 oz (87.2 kg)  05/04/21 185 lb 3.2 oz (84 kg)     Health Maintenance Due  Topic Date Due   PAP SMEAR-Modifier  Never done   COVID-19 Vaccine (3 - Booster for Pfizer series) 02/02/2020    There are no preventive care reminders to display for this patient.  Lab Results  Component Value Date   TSH 1.36 05/11/2021   Lab Results  Component Value Date   WBC 8.5 05/11/2021   HGB 13.7 05/11/2021   HCT 40.5 05/11/2021   MCV 85.4 05/11/2021   PLT 339.0 05/11/2021   Lab Results  Component Value Date   NA 138 05/11/2021   K  4.3 05/11/2021   CO2 22 05/11/2021   GLUCOSE 102 (H) 05/11/2021   BUN 15 05/11/2021   CREATININE 0.73 05/11/2021   BILITOT 0.9 05/11/2021   ALKPHOS 94 05/11/2021   AST 29 05/11/2021   ALT 33 05/11/2021   PROT 6.2 05/11/2021   ALBUMIN 4.3 05/11/2021   CALCIUM 9.1 05/11/2021   ANIONGAP 11 06/05/2020   EGFR 100 05/04/2021   GFR 104.91 05/11/2021   Lab Results  Component Value Date   CHOL 236 (H) 02/22/2021   Lab Results  Component Value Date   HDL 77.40 02/22/2021   Lab Results  Component Value Date   LDLCALC 143 (H) 02/22/2021   Lab Results  Component Value Date   TRIG 78.0 02/22/2021   Lab Results  Component Value Date   CHOLHDL 3  02/22/2021   Lab Results  Component Value Date   HGBA1C 5.6 02/22/2021      Assessment & Plan:   Problem List Items Addressed This Visit   None Visit Diagnoses     Panic attack    -  Primary   Relevant Medications   FLUoxetine (PROZAC) 20 MG capsule   clonazePAM (KLONOPIN) 0.5 MG disintegrating tablet   GAD (generalized anxiety disorder)       Relevant Medications   FLUoxetine (PROZAC) 20 MG capsule       Meds ordered this encounter  Medications   FLUoxetine (PROZAC) 20 MG capsule    Sig: Take 1 capsule (20 mg total) by mouth daily.    Dispense:  90 capsule    Refill:  3    Order Specific Question:   Supervising Provider    Answer:   Carlota Raspberry, JEFFREY R [[id number]]   clonazePAM (KLONOPIN) 0.5 MG disintegrating tablet    Sig: Take 1 tablet (0.5 mg total) by mouth 2 (two) times daily.    Dispense:  30 tablet    Refill:  0    Order Specific Question:   Supervising Provider    Answer:   Carlota Raspberry, JEFFREY R [[id number]]    Follow-up: Return in about 3 months (around 01/02/2022) for or so for med check - mostly as needed!Marland Kitchen   PLAN Stop wellbutrin and start fluoxetine daily Med check 8 weeks after starting Clonazepam for panic attacks or when flying Reviewed risk of medications with patient who voices understanding Patient encouraged to call clinic with any questions, comments, or concerns.   Maximiano Coss, NP

## 2021-10-04 NOTE — Patient Instructions (Addendum)
Ms. Airabella Barley to see you, sorry things aren't as good as we hoped!  Ok to start fluoxetine. Can do this before or after stopping wellbutrin.  Effect in 4-6 weeks typically  Have sent clonazepam to use prn  Send me a MyChart message to let me know how it's going  Thank you  Rich     If you have lab work done today you will be contacted with your lab results within the next 2 weeks.  If you have not heard from [place] then please contact us. The fastest way to get your results is to register for My Chart.   IF you received an x-ray today, you will receive an invoice from [facility]. Please contact [facility] at [phone] with questions or concerns regarding your invoice.   IF you received labwork today, you will receive an invoice from [place]. Please contact [facility] at [phone] with questions or concerns regarding your invoice.   Our billing staff will not be able to assist you with questions regarding bills from these companies.  You will be contacted with the lab results as soon as they are available. The fastest way to get your results is to activate your My Chart account. Instructions are located on the last page of this paperwork. If you have not heard from [place] regarding the results in 2 weeks, please contact this office.

## 2021-10-05 ENCOUNTER — Other Ambulatory Visit (HOSPITAL_BASED_OUTPATIENT_CLINIC_OR_DEPARTMENT_OTHER): Payer: Self-pay

## 2021-10-09 ENCOUNTER — Other Ambulatory Visit: Payer: Self-pay | Admitting: Registered Nurse

## 2021-10-09 DIAGNOSIS — F988 Other specified behavioral and emotional disorders with onset usually occurring in childhood and adolescence: Secondary | ICD-10-CM

## 2021-10-10 ENCOUNTER — Other Ambulatory Visit (HOSPITAL_BASED_OUTPATIENT_CLINIC_OR_DEPARTMENT_OTHER): Payer: Self-pay

## 2021-10-10 MED ORDER — AMPHETAMINE-DEXTROAMPHET ER 10 MG PO CP24
10.0000 mg | ORAL_CAPSULE | Freq: Every day | ORAL | 0 refills | Status: DC
  Filled 2021-10-10: qty 30, 30d supply, fill #0

## 2021-10-12 ENCOUNTER — Other Ambulatory Visit (HOSPITAL_BASED_OUTPATIENT_CLINIC_OR_DEPARTMENT_OTHER): Payer: Self-pay

## 2021-10-12 MED ORDER — METHYLPREDNISOLONE 4 MG PO TBPK
ORAL_TABLET | ORAL | 0 refills | Status: AC
  Filled 2021-10-12: qty 21, 6d supply, fill #0

## 2021-10-12 MED ORDER — AZITHROMYCIN 250 MG PO TABS
ORAL_TABLET | ORAL | 0 refills | Status: DC
  Filled 2021-10-12: qty 6, 5d supply, fill #0

## 2021-10-12 MED ORDER — BENZONATATE 100 MG PO CAPS
ORAL_CAPSULE | ORAL | 0 refills | Status: AC
  Filled 2021-10-12: qty 30, 10d supply, fill #0

## 2021-10-16 ENCOUNTER — Other Ambulatory Visit: Payer: 59

## 2021-10-18 ENCOUNTER — Other Ambulatory Visit: Payer: Self-pay

## 2021-10-18 ENCOUNTER — Ambulatory Visit
Admission: RE | Admit: 2021-10-18 | Discharge: 2021-10-18 | Disposition: A | Discharge status: Home / Self Care | Payer: No Typology Code available for payment source | Source: Ambulatory Visit | Attending: Obstetrics and Gynecology | Admitting: Obstetrics and Gynecology

## 2021-10-18 DIAGNOSIS — R928 Other abnormal and inconclusive findings on diagnostic imaging of breast: Secondary | ICD-10-CM

## 2021-10-18 MED ORDER — GADOBUTROL 1 MMOL/ML IV SOLN
9.0000 mL | Freq: Once | INTRAVENOUS | Status: AC | PRN
  Administered 2021-10-18: 10:00:00 9 mL via INTRAVENOUS

## 2021-10-19 ENCOUNTER — Other Ambulatory Visit: Payer: Self-pay | Admitting: Registered Nurse

## 2021-10-19 ENCOUNTER — Other Ambulatory Visit (HOSPITAL_BASED_OUTPATIENT_CLINIC_OR_DEPARTMENT_OTHER): Payer: Self-pay

## 2021-10-19 DIAGNOSIS — E559 Vitamin D deficiency, unspecified: Secondary | ICD-10-CM

## 2021-10-19 MED ORDER — VITAMIN D (ERGOCALCIFEROL) 1.25 MG (50000 UNIT) PO CAPS
50000.0000 [IU] | ORAL_CAPSULE | ORAL | 0 refills | Status: DC
  Filled 2021-11-15: qty 12, 84d supply, fill #0

## 2021-10-27 ENCOUNTER — Other Ambulatory Visit (HOSPITAL_BASED_OUTPATIENT_CLINIC_OR_DEPARTMENT_OTHER): Payer: Self-pay

## 2021-11-15 ENCOUNTER — Other Ambulatory Visit (HOSPITAL_BASED_OUTPATIENT_CLINIC_OR_DEPARTMENT_OTHER): Payer: Self-pay

## 2021-11-15 ENCOUNTER — Other Ambulatory Visit: Payer: Self-pay | Admitting: Registered Nurse

## 2021-11-15 DIAGNOSIS — G43809 Other migraine, not intractable, without status migrainosus: Secondary | ICD-10-CM

## 2021-11-15 DIAGNOSIS — F988 Other specified behavioral and emotional disorders with onset usually occurring in childhood and adolescence: Secondary | ICD-10-CM

## 2021-11-15 MED ORDER — AMPHETAMINE-DEXTROAMPHET ER 10 MG PO CP24
10.0000 mg | ORAL_CAPSULE | Freq: Every day | ORAL | 0 refills | Status: AC
  Filled 2021-11-15: qty 30, 30d supply, fill #0

## 2021-11-15 MED ORDER — TOPIRAMATE 25 MG PO TABS
25.0000 mg | ORAL_TABLET | Freq: Every day | ORAL | 1 refills | Status: AC
  Filled 2021-11-15: qty 90, 90d supply, fill #0
  Filled 2021-12-13 – 2022-03-12 (×2): qty 90, 90d supply, fill #1

## 2021-11-15 NOTE — Telephone Encounter (Signed)
Patient is requesting a refill of the following medications: Requested Prescriptions   Pending Prescriptions Disp Refills   amphetamine-dextroamphetamine (ADDERALL XR) 10 MG 24 hr capsule 30 capsule 0    Sig: Take 1 capsule (10 mg total) by mouth daily.    Date of patient request: 11/15/2021 Last office visit: 10/04/2021 Date of last refill: 10/10/2021 Last refill amount: 30 capsule Follow up time period per chart: n/a

## 2021-11-27 ENCOUNTER — Other Ambulatory Visit (HOSPITAL_BASED_OUTPATIENT_CLINIC_OR_DEPARTMENT_OTHER): Payer: Self-pay

## 2021-12-12 ENCOUNTER — Other Ambulatory Visit (HOSPITAL_BASED_OUTPATIENT_CLINIC_OR_DEPARTMENT_OTHER): Payer: Self-pay

## 2021-12-13 ENCOUNTER — Other Ambulatory Visit: Payer: Self-pay | Admitting: Registered Nurse

## 2021-12-13 ENCOUNTER — Other Ambulatory Visit (HOSPITAL_BASED_OUTPATIENT_CLINIC_OR_DEPARTMENT_OTHER): Payer: Self-pay

## 2021-12-13 DIAGNOSIS — F988 Other specified behavioral and emotional disorders with onset usually occurring in childhood and adolescence: Secondary | ICD-10-CM

## 2021-12-13 MED ORDER — AMPHETAMINE-DEXTROAMPHET ER 10 MG PO CP24
10.0000 mg | ORAL_CAPSULE | Freq: Every day | ORAL | 0 refills | Status: DC
  Filled 2021-12-13: qty 30, 30d supply, fill #0

## 2021-12-14 ENCOUNTER — Other Ambulatory Visit: Payer: 59

## 2021-12-19 ENCOUNTER — Other Ambulatory Visit (HOSPITAL_BASED_OUTPATIENT_CLINIC_OR_DEPARTMENT_OTHER): Payer: Self-pay

## 2021-12-19 MED ORDER — BENZONATATE 200 MG PO CAPS
ORAL_CAPSULE | ORAL | 0 refills | Status: AC
  Filled 2021-12-19: qty 20, 7d supply, fill #0

## 2021-12-19 MED ORDER — PREDNISONE 10 MG PO TABS
ORAL_TABLET | ORAL | 0 refills | Status: AC
  Filled 2021-12-19: qty 21, 6d supply, fill #0

## 2021-12-19 MED ORDER — AZITHROMYCIN 250 MG PO TABS
ORAL_TABLET | ORAL | 0 refills | Status: AC
  Filled 2021-12-19: qty 6, 5d supply, fill #0

## 2021-12-22 ENCOUNTER — Other Ambulatory Visit (HOSPITAL_BASED_OUTPATIENT_CLINIC_OR_DEPARTMENT_OTHER): Payer: Self-pay

## 2021-12-27 ENCOUNTER — Other Ambulatory Visit (HOSPITAL_BASED_OUTPATIENT_CLINIC_OR_DEPARTMENT_OTHER): Payer: Self-pay

## 2022-01-24 ENCOUNTER — Other Ambulatory Visit: Payer: Self-pay | Admitting: Registered Nurse

## 2022-01-24 ENCOUNTER — Other Ambulatory Visit (HOSPITAL_BASED_OUTPATIENT_CLINIC_OR_DEPARTMENT_OTHER): Payer: Self-pay

## 2022-01-24 DIAGNOSIS — F988 Other specified behavioral and emotional disorders with onset usually occurring in childhood and adolescence: Secondary | ICD-10-CM

## 2022-01-24 MED ORDER — AMPHETAMINE-DEXTROAMPHET ER 10 MG PO CP24
10.0000 mg | ORAL_CAPSULE | Freq: Every day | ORAL | 0 refills | Status: DC
  Filled 2022-01-24: qty 30, 30d supply, fill #0

## 2022-01-24 NOTE — Telephone Encounter (Signed)
Patient is requesting a refill of the following medications: ?Requested Prescriptions  ? ?Pending Prescriptions Disp Refills  ? amphetamine-dextroamphetamine (ADDERALL XR) 10 MG 24 hr capsule 30 capsule 0  ?  Sig: Take 1 capsule (10 mg total) by mouth daily.  ? ? ?Date of patient request: 01/24/2022 ?Last office visit: 10/04/2021 ?Date of last refill: 12/13/2021 ?Last refill amount: 30capsules ?Follow up time period per chart: none  ?

## 2022-01-25 ENCOUNTER — Other Ambulatory Visit (HOSPITAL_BASED_OUTPATIENT_CLINIC_OR_DEPARTMENT_OTHER): Payer: Self-pay

## 2022-01-25 ENCOUNTER — Encounter (HOSPITAL_BASED_OUTPATIENT_CLINIC_OR_DEPARTMENT_OTHER): Payer: Self-pay | Admitting: Pharmacist

## 2022-01-26 ENCOUNTER — Other Ambulatory Visit (HOSPITAL_BASED_OUTPATIENT_CLINIC_OR_DEPARTMENT_OTHER): Payer: Self-pay

## 2022-01-31 ENCOUNTER — Encounter (HOSPITAL_BASED_OUTPATIENT_CLINIC_OR_DEPARTMENT_OTHER): Payer: Self-pay | Admitting: Pharmacist

## 2022-01-31 ENCOUNTER — Other Ambulatory Visit: Payer: Self-pay | Admitting: Registered Nurse

## 2022-01-31 ENCOUNTER — Other Ambulatory Visit (HOSPITAL_BASED_OUTPATIENT_CLINIC_OR_DEPARTMENT_OTHER): Payer: Self-pay

## 2022-01-31 DIAGNOSIS — E559 Vitamin D deficiency, unspecified: Secondary | ICD-10-CM

## 2022-03-12 ENCOUNTER — Other Ambulatory Visit: Payer: Self-pay | Admitting: Registered Nurse

## 2022-03-12 ENCOUNTER — Other Ambulatory Visit (HOSPITAL_BASED_OUTPATIENT_CLINIC_OR_DEPARTMENT_OTHER): Payer: Self-pay

## 2022-03-12 DIAGNOSIS — F988 Other specified behavioral and emotional disorders with onset usually occurring in childhood and adolescence: Secondary | ICD-10-CM

## 2022-03-12 DIAGNOSIS — E559 Vitamin D deficiency, unspecified: Secondary | ICD-10-CM

## 2022-03-12 NOTE — Telephone Encounter (Signed)
Patient is requesting a refill of the following medications: Requested Prescriptions   Pending Prescriptions Disp Refills   Vitamin D, Ergocalciferol, (DRISDOL) 1.25 MG (50000 UNIT) CAPS capsule 12 capsule 0    Sig: Take 1 capsule (50,000 Units total) by mouth every 7 (seven) days.   amphetamine-dextroamphetamine (ADDERALL XR) 10 MG 24 hr capsule 30 capsule 0    Sig: Take 1 capsule (10 mg total) by mouth daily.    Date of patient request: 03/12/2022 Last office visit: 10/04/2021 Date of last refill: 01/24/2022 Last refill amount: 30 capsules Follow up time period per chart: n/a

## 2022-03-13 ENCOUNTER — Other Ambulatory Visit (HOSPITAL_BASED_OUTPATIENT_CLINIC_OR_DEPARTMENT_OTHER): Payer: Self-pay

## 2022-03-13 MED ORDER — AMPHETAMINE-DEXTROAMPHET ER 10 MG PO CP24
10.0000 mg | ORAL_CAPSULE | Freq: Every day | ORAL | 0 refills | Status: AC
  Filled 2022-03-13: qty 90, 90d supply, fill #0

## 2022-03-13 MED ORDER — VITAMIN D (ERGOCALCIFEROL) 1.25 MG (50000 UNIT) PO CAPS
50000.0000 [IU] | ORAL_CAPSULE | ORAL | 0 refills | Status: DC
  Filled 2022-03-13: qty 12, 84d supply, fill #0

## 2022-03-21 ENCOUNTER — Ambulatory Visit (INDEPENDENT_AMBULATORY_CARE_PROVIDER_SITE_OTHER): Payer: BC Managed Care – PPO

## 2022-03-21 ENCOUNTER — Ambulatory Visit (HOSPITAL_BASED_OUTPATIENT_CLINIC_OR_DEPARTMENT_OTHER): Payer: BC Managed Care – PPO | Admitting: Orthopaedic Surgery

## 2022-03-21 ENCOUNTER — Encounter (HOSPITAL_BASED_OUTPATIENT_CLINIC_OR_DEPARTMENT_OTHER): Payer: Self-pay | Admitting: Orthopaedic Surgery

## 2022-03-21 DIAGNOSIS — G8929 Other chronic pain: Secondary | ICD-10-CM

## 2022-03-21 DIAGNOSIS — M25511 Pain in right shoulder: Secondary | ICD-10-CM

## 2022-03-21 NOTE — Progress Notes (Signed)
Chief Complaint: Right shoulder pain     History of Present Illness:    Shelby Norton is a 41 y.o. female right-hand-dominant presents with right shoulder pain which has been ongoing since January of this year although has actually been happening for multiple years.  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  She states that the ergonomics of this subsequently led to significant shoulder pain.  This was overall manageable.  She was using ibuprofen and Tylenol.  She does also endorse popping and clicking in the shoulder.  She states that in January 2023 she was at her son's rockclimbing birthday party and since that time has had significant shoulder pain that has been limiting her in most activities of daily living.  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  States that the pain is dull always but occasionally sharp when lifting overhead.  She continues to take ibuprofen for pain although is here today for further assessment as she continues to have pain.    Surgical History:   None  PMH/PSH/Family History/Social History/Meds/Allergies:    Past Medical History:  Diagnosis Date   Allergy    Anemia    Complication of anesthesia    per pt hard to wake   GERD (gastroesophageal reflux disease)    Heart murmur    Hematuria    History of exercise stress test    ETT 03-09-2016 in careeverywhere in epic-- result negative   History of gestational hypertension 07/2017   preeclamptic   History of kidney stones    History of pre-eclampsia    10/ 2018;  2010   History of rhabdomyolysis 12/2013   History of supraventricular tachycardia    per pt hx one episode , last halter montior  05/ 2017 note svt ectopy x4 in careeverywhere from [facility] in [place]   HSV-1 (herpes simplex virus 1)  infection    Lactose intolerance    Migraine    Migraines    Mild asthma    prn inhaler   Mitral valve prolapse    per pt dx mvp yrs ago and has been followed cardiologist---  last office visit 05/ 2017 [facility] in [place].  in careeverywhere   MTHFR mutation    Neuromuscular disorder (HCC)    Palpitations    Raynauds syndrome    took procardia   Rheumatic fever    Swallowing difficulty    Wears contact lenses    Past Surgical History:  Procedure Laterality Date   ANTERIOR CRUCIATE LIGAMENT REPAIR Right 10/2015   CYSTOSCOPY/URETEROSCOPY/HOLMIUM LASER/STENT PLACEMENT Bilateral 12/24/2017   Procedure: CYSTOSCOPY/RETROGRADE/URETEROSCOPY/HOLMIUM LASER LITHOTRIPSY, STONE BASKETRY /STENT PLACEMENT;  Surgeon: Rene PaciWinter, Christopher Aaron, MD;  [facility] [place];  Service: Urology;  Laterality: Bilateral;   HOLMIUM LASER APPLICATION Bilateral 12/24/2017   Procedure: HOLMIUM LASER APPLICATION;  Surgeon: Rene PaciWinter, Christopher Aaron, MD;  [facility] [place];  Service: Urology;  Laterality: Bilateral;   KIDNEY STONE SURGERY     NASAL SEPTUM SURGERY  02/2013   NASAL SINUS SURGERY     TOE SOFT TISSUE RELEASE W/ PINNING  2020   5th  toe   TONSILLECTOMY  1991   WISDOM TOOTH EXTRACTION     Social History   Socioeconomic History   Marital status: Married    Spouse name: Not on file   Number of children: 2   Years of education: Not on file   Highest education level: Not on file  Occupational History   Occupation: RN  Tobacco Use   Smoking status: Never   Smokeless tobacco: Never  Vaping Use   Vaping Use: Never used  Substance and Sexual Activity   Alcohol use: Yes    Comment: occ   Drug use: No   Sexual activity: Yes    Birth control/protection: None  Other Topics Concern   Not on file  Social History Narrative   Not on file   Social Determinants of Health   Financial Resource Strain: Not on file  Food Insecurity: Not on file   Transportation Needs: Not on file  Physical Activity: Not on file  Stress: Not on file  Social Connections: Not on file   Family History  Problem Relation Age of Onset   Diabetes Mother    Hypertension Mother    Hyperlipidemia Mother    Obesity Mother    Diabetes Father    Hypertension Father    Other Father        TIA   Stroke Father    Heart disease Father    Kidney disease Father    Liver disease Father    Alcoholism Father    Obesity Father    Obstructive Sleep Apnea Father    Diabetes Brother    Lung cancer Maternal Grandmother        chronic secondhand smoke exposure   Brain cancer Maternal Grandmother    Breast cancer Maternal Grandmother    Stroke Paternal Grandfather    Allergies  Allergen Reactions   Aspirin Hives   Other Anaphylaxis    hazelnuts   Codeine Nausea And Vomiting and Swelling   Hydrocodone Nausea And Vomiting and Swelling   Sulfamethoxazole Nausea And Vomiting   Betamethasone Rash    On face and neck   Current Outpatient Medications  Medication Sig Dispense Refill   albuterol (PROVENTIL HFA;VENTOLIN HFA) 108 (90 Base) MCG/ACT inhaler Inhale 1-2 puffs into the lungs every 6 (six) hours as needed for wheezing or shortness of breath.     amphetamine-dextroamphetamine (ADDERALL XR) 10 MG 24 hr capsule Take 1 capsule (10 mg total) by mouth daily. (Patient not taking: No sig reported) 30 capsule 0   amphetamine-dextroamphetamine (ADDERALL XR) 10 MG 24 hr capsule Take 1 capsule (10 mg total) by mouth daily. 30 capsule 0   amphetamine-dextroamphetamine (ADDERALL XR) 10 MG 24 hr capsule Take 1 capsule (10 mg total) by mouth daily. 90 capsule 0   azithromycin (ZITHROMAX) 250 MG tablet Take two tablets on the first day and then one tablet every day after. 6 tablet 0   benzonatate (TESSALON) 100 MG capsule Take 1 capsule (100 mg total) by mouth 3 (three) times daily as needed for up to 10 days. 30 capsule 0   benzonatate (TESSALON) 200 MG capsule Take 1  capsule (200 mg total) by mouth 3 (three)  times daily as needed for up to 7 days for Cough. 20 capsule 0   buPROPion (WELLBUTRIN SR) 150 MG 12 hr tablet Take 1 tablet (150 mg total) by mouth 2 (two) times daily. 90 tablet 1   clonazePAM (KLONOPIN) 0.5 MG disintegrating tablet Take 1 tablet (0.5 mg  total) by mouth 2 (two) times daily. 30 tablet 0   EPINEPHrine 0.3 mg/0.3 mL IJ SOAJ injection Inject 0.3 mg into the skin daily as needed. For allergic reaction     methylPREDNISolone (MEDROL DOSEPAK) 4 MG TBPK tablet Follow package directions 21 tablet 0   predniSONE (DELTASONE) 10 MG tablet Take as directed on dose pack for 6 days. See attached sheet. 21 tablet 0   Probiotic Product (PROBIOTIC PO) Take 2 capsules by mouth daily.     topiramate (TOPAMAX) 25 MG tablet Take 1 tablet (25 mg total) by mouth daily. 90 tablet 1   vitamin C (ASCORBIC ACID) 500 MG tablet Take 500 mg by mouth daily.     Vitamin D, Ergocalciferol, (DRISDOL) 1.25 MG (50000 UNIT) CAPS capsule Take 1 capsule (50,000 Units total) by mouth every 7 (seven) days. 12 capsule 0   zinc gluconate 50 MG tablet Take 50 mg by mouth daily.     No current facility-administered medications for this visit.   No results found.  Review of Systems:   A ROS was performed including pertinent positives and negatives as documented in the HPI.  Physical Exam :   Constitutional: NAD and appears stated age Neurological: Alert and oriented Psych: Appropriate affect and cooperative There were no vitals taken for this visit.   Comprehensive Musculoskeletal Exam:    Musculoskeletal Exam    Inspection Right Left  Skin No atrophy or winging No atrophy or winging  Palpation    Tenderness Glenohumeral None  Range of Motion    Flexion (passive) 170 170  Flexion (active) 170 170  Abduction 170 170  ER at the side 70 70  Can reach behind back to T12 T12  Strength     Full Full  Special Tests    Pseudoparalytic No No  Neurologic    Fires PIN,  radial, median, ulnar, musculocutaneous, axillary, suprascapular, long thoracic, and spinal accessory innervated muscles. No abnormal sensibility  Vascular/Lymphatic    Radial Pulse 2+ 2+  Cervical Exam    Patient has symmetric cervical range of motion with negative Spurling's test.  Special Test: Positive O'Brien's test     Imaging:   Xray (3 views right shoulder): Normal   I personally reviewed and interpreted the radiographs.   Assessment:   41 y.o. female right-hand-dominant with right shoulder pain consistent with labral pathology.  Given the fact that this has been occurring now for several years I do believe an MRI is warranted to rule out an underlying labral injury.  I would also like her to participate in physical therapy for dynamic stabilization program in hopes to compensate for this.  We did also discuss the role of a possible steroid injection although she is hesitant to pursue this without a more clear underlying etiology of the pain.  We will plan for right shoulder MRI and follow-up discuss results.  Plan :    -Plan for right shoulder MRI and follow-up to discuss results     I personally saw and evaluated the patient, and participated in the management and treatment plan.  Huel Cote, MD Attending Physician, Orthopedic Surgery  This document was dictated using Dragon voice recognition software. A reasonable attempt at proof reading has been made to minimize errors.

## 2022-04-09 ENCOUNTER — Other Ambulatory Visit: Payer: BC Managed Care – PPO

## 2022-04-11 ENCOUNTER — Ambulatory Visit
Admission: RE | Admit: 2022-04-11 | Discharge: 2022-04-11 | Disposition: A | Discharge status: Home / Self Care | Payer: BC Managed Care – PPO | Source: Ambulatory Visit | Attending: Orthopaedic Surgery | Admitting: Orthopaedic Surgery

## 2022-04-11 DIAGNOSIS — G8929 Other chronic pain: Secondary | ICD-10-CM

## 2022-04-11 MED ORDER — IOPAMIDOL (ISOVUE-M 200) INJECTION 41%
12.0000 mL | Freq: Once | INTRAMUSCULAR | Status: AC
  Administered 2022-04-11: 12 mL via INTRA_ARTICULAR

## 2022-04-13 ENCOUNTER — Encounter (HOSPITAL_BASED_OUTPATIENT_CLINIC_OR_DEPARTMENT_OTHER): Payer: Self-pay | Admitting: Physical Therapy

## 2022-04-13 ENCOUNTER — Ambulatory Visit (HOSPITAL_BASED_OUTPATIENT_CLINIC_OR_DEPARTMENT_OTHER): Payer: BC Managed Care – PPO | Attending: Orthopaedic Surgery | Admitting: Physical Therapy

## 2022-04-13 ENCOUNTER — Other Ambulatory Visit: Payer: Self-pay

## 2022-04-13 DIAGNOSIS — R252 Cramp and spasm: Secondary | ICD-10-CM | POA: Insufficient documentation

## 2022-04-13 DIAGNOSIS — M25511 Pain in right shoulder: Secondary | ICD-10-CM | POA: Diagnosis present

## 2022-04-13 DIAGNOSIS — G8929 Other chronic pain: Secondary | ICD-10-CM | POA: Diagnosis not present

## 2022-04-13 NOTE — Therapy (Deleted)
OUTPATIENT PHYSICAL THERAPY SHOULDER EVALUATION   Patient Name: Shelby Norton MRN: [id number] DOB:[birth year], 41 y.o., female Today's Date: 04/13/2022    Past Medical History:  Diagnosis Date   Allergy    Anemia    Complication of anesthesia    per pt hard to wake   GERD (gastroesophageal reflux disease)    Heart murmur    Hematuria    History of exercise stress test    ETT 03-09-2016 in careeverywhere in epic-- result negative   History of gestational hypertension 07/2017   preeclamptic   History of kidney stones    History of pre-eclampsia    10/ 2018;  2010   History of rhabdomyolysis 12/2013   History of supraventricular tachycardia    per pt hx one episode , last halter montior  05/ 2017 note svt ectopy x4 in careeverywhere from [facility] in [place]   HSV-1 (herpes simplex virus 1) infection    Lactose intolerance    Migraine    Migraines    Mild asthma    prn inhaler   Mitral valve prolapse    per pt dx mvp yrs ago and has been followed cardiologist---  last office visit 05/ 2017 [facility] in [place].  in careeverywhere   MTHFR mutation    Neuromuscular disorder (HCC)    Palpitations    Raynauds syndrome    took procardia   Rheumatic fever    Swallowing difficulty    Wears contact lenses    Past Surgical History:  Procedure Laterality Date   ANTERIOR CRUCIATE LIGAMENT REPAIR Right 10/2015   CYSTOSCOPY/URETEROSCOPY/HOLMIUM LASER/STENT PLACEMENT Bilateral 12/24/2017   Procedure: CYSTOSCOPY/RETROGRADE/URETEROSCOPY/HOLMIUM LASER LITHOTRIPSY, STONE BASKETRY /STENT PLACEMENT;  Surgeon: Rene Paci, MD;  Location[facility];  Service: Urology;  Laterality: Bilateral;   HOLMIUM LASER APPLICATION Bilateral 12/24/2017   Procedure: HOLMIUM LASER APPLICATION;  Surgeon: Rene Paci, MD;  Location: [facility];  Service: Urology;  Laterality: Bilateral;   KIDNEY STONE SURGERY     NASAL SEPTUM SURGERY   02/2013   NASAL SINUS SURGERY     TOE SOFT TISSUE RELEASE W/ PINNING  2020   5th toe   TONSILLECTOMY  1991   WISDOM TOOTH EXTRACTION     Patient Active Problem List   Diagnosis Date Noted   Dysphagia 07/27/2021   Pain in joint, shoulder region 07/27/2021   Rash and other nonspecific skin eruption 07/27/2021   Sinusitis 07/27/2021   Superior glenoid labrum lesion 07/27/2021   Vitamin B12 deficiency 07/27/2021   Vitamin D deficiency 06/16/2020   Prediabetes 05/05/2020   Stress 05/05/2020   Class 1 obesity with serious comorbidity and body mass index (BMI) of 31.0 to 31.9 in adult 05/05/2020   Attention deficit disorder 05/03/2020   History of nephrolithiasis 04/05/2020   Pelvic pain 12/22/2017   SVD (spontaneous vaginal delivery) / [name] 11/21 09/12/2017   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  Rationale for Evaluation and Treatment Rehabilitation  ONSET DATE: Jan. 2023  SUBJECTIVE:  SUBJECTIVE STATEMENT: ***   PERTINENT HISTORY: Potential sublux about 8 years ago  PAIN:  Are you having pain? Yes: NPRS scale: 2/10 - goes up to 5/6 Pain location: Anterior shoulder Pain description: Ache, numbness/tingling at night into 3-5 digits Aggravating factors: *** Relieving factors: Heat, NSAIDs  PRECAUTIONS: None  WEIGHT BEARING  RESTRICTIONS No  FALLS:  Has patient fallen in last 6 months? No  LIVING ENVIRONMENT: Lives with: lives with their family and lives with their spouse Lives in: House/apartment  OCCUPATION: Nurse  PLOF: Independent  PATIENT GOALS  Manage pain better,   OBJECTIVE:   DIAGNOSTIC FINDINGS:  ***  PATIENT SURVEYS:  FOTO ***  COGNITION:  Overall cognitive status: Within functional limits for tasks assessed     SENSATION: {sensation:27233}  POSTURE: ***  UPPER EXTREMITY ROM:   Active ROM Right eval Left eval  Shoulder flexion 20 29  Shoulder extension    Shoulder abduction 10 26  Shoulder adduction    Shoulder internal rotation 25.8 28.2  Shoulder external rotation 15 with pain  21.5  Elbow flexion 33.8 with pain  26  Elbow extension    Wrist flexion    Wrist extension    Wrist ulnar deviation    Wrist radial deviation    Wrist pronation    Wrist supination    (Blank rows = not tested)  Painful arc sign with flexion/abduction at 80deg  UPPER EXTREMITY MMT:  MMT Right eval Left eval  Shoulder flexion    Shoulder extension    Shoulder abduction    Shoulder adduction    Shoulder internal rotation    Shoulder external rotation    Middle trapezius    Lower trapezius    Elbow flexion    Elbow extension    Wrist flexion    Wrist extension    Wrist ulnar deviation    Wrist radial deviation    Wrist pronation    Wrist supination    Grip strength (lbs) 27 28  (Blank rows = not tested)  SHOULDER SPECIAL TESTS:  Impingement tests: Painful arc test: positive   SLAP lesions: {SLAP lesions:25232}  Rotator cuff assessment: Full can test: positive  and Gerber lift off test: negative  JOINT MOBILITY TESTING:  ***  PALPATION:  ***   TODAY'S TREATMENT:  ***   PATIENT EDUCATION: Education details: *** Person educated: {Person educated:25204} Education method: {Education Method:25205} Education comprehension: {Education  Comprehension:25206}   HOME EXERCISE PROGRAM: ***  ASSESSMENT:  CLINICAL IMPRESSION: Patient is a *** y.o. *** who was seen today for physical therapy evaluation and treatment for ***.    OBJECTIVE IMPAIRMENTS {opptimpairments:25111}.   ACTIVITY LIMITATIONS {activitylimitations:27494}  PARTICIPATION LIMITATIONS: {participationrestrictions:25113}  PERSONAL FACTORS {Personal factors:25162} are also affecting patient's functional outcome.   REHAB POTENTIAL: {rehabpotential:25112}  CLINICAL DECISION MAKING: {clinical decision making:25114}  EVALUATION COMPLEXITY: {Evaluation complexity:25115}   GOALS: Goals reviewed with patient? {yes/no:20286}  SHORT TERM GOALS: Target date: {follow up:25551}  (Remove Blue Hyperlink)  *** Baseline: Goal status: {GOALSTATUS:25110}  2.  *** Baseline:  Goal status: {GOALSTATUS:25110}  3.  *** Baseline:  Goal status: {GOALSTATUS:25110}  4.  *** Baseline:  Goal status: {GOALSTATUS:25110}  5.  *** Baseline:  Goal status: {GOALSTATUS:25110}  6.  *** Baseline:  Goal status: {GOALSTATUS:25110}  LONG TERM GOALS: Target date: {follow up:25551}  (Remove Blue Hyperlink)  *** Baseline:  Goal status: {GOALSTATUS:25110}  2.  *** Baseline:  Goal status: {GOALSTATUS:25110}  3.  *** Baseline:  Goal status: {GOALSTATUS:25110}  4.  *** Baseline:  Goal status: {GOALSTATUS:25110}  5.  *** Baseline:  Goal status: {GOALSTATUS:25110}  6.  *** Baseline:  Goal status: {GOALSTATUS:25110}   PLAN: PT FREQUENCY: {rehab frequency:25116}  PT DURATION: {rehab duration:25117}  PLANNED INTERVENTIONS: {rehab planned interventions:25118::"Therapeutic exercises","Therapeutic activity","Neuromuscular re-education","Balance training","Gait training","Patient/Family education","Joint mobilization"}  PLAN FOR NEXT SESSION: ***   Dessie Coma, PT 04/13/2022, 8:08 AM

## 2022-04-16 ENCOUNTER — Encounter (HOSPITAL_BASED_OUTPATIENT_CLINIC_OR_DEPARTMENT_OTHER): Payer: Self-pay | Admitting: Physical Therapy

## 2022-04-16 ENCOUNTER — Ambulatory Visit (HOSPITAL_BASED_OUTPATIENT_CLINIC_OR_DEPARTMENT_OTHER): Payer: BC Managed Care – PPO | Admitting: Physical Therapy

## 2022-04-16 DIAGNOSIS — M25511 Pain in right shoulder: Secondary | ICD-10-CM

## 2022-04-16 DIAGNOSIS — R252 Cramp and spasm: Secondary | ICD-10-CM

## 2022-04-18 ENCOUNTER — Ambulatory Visit (INDEPENDENT_AMBULATORY_CARE_PROVIDER_SITE_OTHER): Payer: BC Managed Care – PPO | Admitting: Orthopaedic Surgery

## 2022-04-18 ENCOUNTER — Ambulatory Visit (HOSPITAL_BASED_OUTPATIENT_CLINIC_OR_DEPARTMENT_OTHER): Payer: BC Managed Care – PPO | Admitting: Physical Therapy

## 2022-04-18 DIAGNOSIS — S46811A Strain of other muscles, fascia and tendons at shoulder and upper arm level, right arm, initial encounter: Secondary | ICD-10-CM

## 2022-04-18 NOTE — Progress Notes (Signed)
Chief Complaint: Right shoulder pain     History of Present Illness:   04/18/2022: Presents today for follow-up of the right shoulder.  She states that the area of pain that she is experiencing is now transitioning to the anterior aspect of the shoulder.  She is here today for further assessment.  She has been working on physical therapy and overall states that her neck and upper trapezius pain have improved with this.  Continues to make improvements although still having pain and soreness with overhead activity.  Shelby Norton is a 41 y.o. female right-hand-dominant presents with right shoulder pain which has been ongoing since January of this year although has actually been happening for multiple years.  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  She states that the ergonomics of this subsequently led to significant shoulder pain.  This was overall manageable.  She was using ibuprofen and Tylenol.  She does also endorse popping and clicking in the shoulder.  She states that in January 2023 she was at her son's rockclimbing birthday party and since that time has had significant shoulder pain that has been limiting her in most activities of daily living.  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  States that the pain is dull always but occasionally sharp when lifting overhead.  She continues to take ibuprofen for pain although is here today for further assessment as she continues to have pain.    Surgical History:   None  PMH/PSH/Family History/Social History/Meds/Allergies:    Past Medical History:  Diagnosis Date   Allergy    Anemia    Complication of anesthesia    per pt hard to wake   GERD (gastroesophageal reflux disease)    Heart murmur    Hematuria    History of exercise stress  test    ETT 03-09-2016 in careeverywhere in epic-- result negative   History of gestational hypertension 07/2017   preeclamptic   History of kidney stones    History of pre-eclampsia    10/ 2018;  2010   History of rhabdomyolysis 12/2013   History of supraventricular tachycardia    per pt hx one episode , last halter montior  05/ 2017 note svt ectopy x4 in careeverywhere from [facility] in [place]   HSV-1 (herpes simplex virus 1) infection    Lactose intolerance    Migraine    Migraines    Mild asthma    prn inhaler   Mitral valve prolapse    per pt dx mvp yrs ago and has been followed cardiologist---  last office visit 05/ 2017 [facility] in [place].  in careeverywhere   MTHFR mutation    Neuromuscular disorder (HCC)    Palpitations    Raynauds syndrome    took procardia   Rheumatic fever    Swallowing difficulty    Wears contact lenses    Past Surgical History:  Procedure Laterality Date   ANTERIOR CRUCIATE LIGAMENT REPAIR Right 10/2015   CYSTOSCOPY/URETEROSCOPY/HOLMIUM LASER/STENT PLACEMENT Bilateral 12/24/2017   Procedure: CYSTOSCOPY/RETROGRADE/URETEROSCOPY/HOLMIUM LASER LITHOTRIPSY, STONE BASKETRY /STENT PLACEMENT;  Surgeon: Rene Paci, MD;  Location:  [facility];  Service: Urology;  Laterality: Bilateral;   HOLMIUM LASER APPLICATION Bilateral 12/24/2017   Procedure: HOLMIUM LASER APPLICATION;  Surgeon: Rene Paci, MD;  Location: [facility];  Service: Urology;  Laterality: Bilateral;   KIDNEY STONE SURGERY     NASAL SEPTUM SURGERY  02/2013   NASAL SINUS SURGERY     TOE SOFT TISSUE RELEASE W/ PINNING  2020   5th toe   TONSILLECTOMY  1991   WISDOM TOOTH EXTRACTION     Social History   Socioeconomic History   Marital status: Married    Spouse name: Not on file   Number of children: 2   Years of education: Not on file   Highest education level: Not on file  Occupational History   Occupation: RN   Tobacco Use   Smoking status: Never   Smokeless tobacco: Never  Vaping Use   Vaping Use: Never used  Substance and Sexual Activity   Alcohol use: Yes    Comment: occ   Drug use: No   Sexual activity: Yes    Birth control/protection: None  Other Topics Concern   Not on file  Social History Narrative   Not on file   Social Determinants of Health   Financial Resource Strain: Not on file  Food Insecurity: Not on file  Transportation Needs: Not on file  Physical Activity: Not on file  Stress: Not on file  Social Connections: Not on file   Family History  Problem Relation Age of Onset   Diabetes Mother    Hypertension Mother    Hyperlipidemia Mother    Obesity Mother    Diabetes Father    Hypertension Father    Other Father        TIA   Stroke Father    Heart disease Father    Kidney disease Father    Liver disease Father    Alcoholism Father    Obesity Father    Obstructive Sleep Apnea Father    Diabetes Brother    Lung cancer Maternal Grandmother        chronic secondhand smoke exposure   Brain cancer Maternal Grandmother    Breast cancer Maternal Grandmother    Stroke Paternal Grandfather    Allergies  Allergen Reactions   Aspirin Hives   Other Anaphylaxis    hazelnuts   Codeine Nausea And Vomiting and Swelling   Hydrocodone Nausea And Vomiting and Swelling   Sulfamethoxazole Nausea And Vomiting   Betamethasone Rash    On face and neck   Current Outpatient Medications  Medication Sig Dispense Refill   albuterol (PROVENTIL HFA;VENTOLIN HFA) 108 (90 Base) MCG/ACT inhaler Inhale 1-2 puffs into the lungs every 6 (six) hours as needed for wheezing or shortness of breath.     amphetamine-dextroamphetamine (ADDERALL XR) 10 MG 24 hr capsule Take 1 capsule (10 mg total) by mouth daily. (Patient not taking: No sig reported) 30 capsule 0   amphetamine-dextroamphetamine (ADDERALL XR) 10 MG 24 hr capsule Take 1 capsule (10 mg total) by mouth daily. (Patient not  taking: Reported on 04/13/2022) 30 capsule 0   amphetamine-dextroamphetamine (ADDERALL XR) 10 MG 24 hr capsule Take 1 capsule (10 mg total) by mouth daily. 90 capsule 0   azithromycin (ZITHROMAX) 250 MG tablet Take two tablets on the first day and then one tablet every day after. (Patient not taking: Reported on 04/13/2022) 6 tablet 0   benzonatate (TESSALON) 100 MG capsule Take 1 capsule (100 mg  total) by mouth 3 (three) times daily as needed for up to 10 days. (Patient not taking: Reported on 04/13/2022) 30 capsule 0   benzonatate (TESSALON) 200 MG capsule Take 1 capsule (200 mg total) by mouth 3 (three)  times daily as needed for up to 7 days for Cough. (Patient not taking: Reported on 04/13/2022) 20 capsule 0   buPROPion (WELLBUTRIN SR) 150 MG 12 hr tablet Take 1 tablet (150 mg total) by mouth 2 (two) times daily. (Patient not taking: Reported on 04/13/2022) 90 tablet 1   clonazePAM (KLONOPIN) 0.5 MG disintegrating tablet Take 1 tablet (0.5 mg total) by mouth 2 (two) times daily. 30 tablet 0   EPINEPHrine 0.3 mg/0.3 mL IJ SOAJ injection Inject 0.3 mg into the skin daily as needed. For allergic reaction (Patient not taking: Reported on 04/13/2022)     methylPREDNISolone (MEDROL DOSEPAK) 4 MG TBPK tablet Follow package directions (Patient not taking: Reported on 04/13/2022) 21 tablet 0   predniSONE (DELTASONE) 10 MG tablet Take as directed on dose pack for 6 days. See attached sheet. (Patient not taking: Reported on 04/13/2022) 21 tablet 0   Probiotic Product (PROBIOTIC PO) Take 2 capsules by mouth daily. (Patient not taking: Reported on 04/13/2022)     topiramate (TOPAMAX) 25 MG tablet Take 1 tablet (25 mg total) by mouth daily. 90 tablet 1   vitamin C (ASCORBIC ACID) 500 MG tablet Take 500 mg by mouth daily. (Patient not taking: Reported on 04/13/2022)     Vitamin D, Ergocalciferol, (DRISDOL) 1.25 MG (50000 UNIT) CAPS capsule Take 1 capsule (50,000 Units total) by mouth every 7 (seven) days. 12 capsule 0    zinc gluconate 50 MG tablet Take 50 mg by mouth daily.     No current facility-administered medications for this visit.   No results found.  Review of Systems:   A ROS was performed including pertinent positives and negatives as documented in the HPI.  Physical Exam :   Constitutional: NAD and appears stated age Neurological: Alert and oriented Psych: Appropriate affect and cooperative There were no vitals taken for this visit.   Comprehensive Musculoskeletal Exam:    Musculoskeletal Exam    Inspection Right Left  Skin No atrophy or winging No atrophy or winging  Palpation    Tenderness Supraspinatus None  Range of Motion    Flexion (passive) 170 170  Flexion (active) 170 170  Abduction 170 170  ER at the side 70 70  Can reach behind back to T12 T12  Strength     Full Full  Special Tests    Pseudoparalytic No No  Neurologic    Fires PIN, radial, median, ulnar, musculocutaneous, axillary, suprascapular, long thoracic, and spinal accessory innervated muscles. No abnormal sensibility  Vascular/Lymphatic    Radial Pulse 2+ 2+  Cervical Exam    Patient has symmetric cervical range of motion with negative Spurling's test.  Special Test: Positive Spurling     Imaging:   Xray (3 views right shoulder): Normal  MRI right shoulder: There is a small interstitial tearing involving the anterior cable of the supraspinatus with a bone cyst in this area of the greater tuberosity  I personally reviewed and interpreted the radiographs.   Assessment:   41 y.o. female right-hand-dominant with right shoulder pain in the setting of a small tear of the supraspinatus tendon.  This is more of an interstitial type tear.  Suspect given how small it is I do believe she would do quite well with physical  therapy and an ultrasound-guided injection into this area.  She would like to proceed with this.  I will plan to see her back in 1 month for reassessment.  She will continue to work on physical  therapy for strengthening of the shoulder  Plan :    -Plan for return to clinic in 4 weeks    I personally saw and evaluated the patient, and participated in the management and treatment plan.  Huel Cote, MD Attending Physician, Orthopedic Surgery  This document was dictated using Dragon voice recognition software. A reasonable attempt at proof reading has been made to minimize errors.

## 2022-05-01 ENCOUNTER — Encounter (HOSPITAL_BASED_OUTPATIENT_CLINIC_OR_DEPARTMENT_OTHER): Payer: Self-pay | Admitting: Physical Therapy

## 2022-05-01 ENCOUNTER — Ambulatory Visit (HOSPITAL_BASED_OUTPATIENT_CLINIC_OR_DEPARTMENT_OTHER): Payer: BC Managed Care – PPO | Attending: Orthopaedic Surgery | Admitting: Physical Therapy

## 2022-05-01 DIAGNOSIS — R252 Cramp and spasm: Secondary | ICD-10-CM | POA: Insufficient documentation

## 2022-05-01 DIAGNOSIS — M25511 Pain in right shoulder: Secondary | ICD-10-CM | POA: Diagnosis not present

## 2022-05-01 NOTE — Therapy (Signed)
OUTPATIENT PHYSICAL THERAPY SHOULDER TREATMENT   Patient Name: Shelby Norton MRN: [id number] DOB:April 26, 1981, 41 y.o., female Today's Date: 05/01/2022   PT End of Session - 05/01/22 1205     Visit Number 3    Number of Visits 13    Date for PT Re-Evaluation 05/25/22    PT Start Time 1101    PT Stop Time 1145    PT Time Calculation (min) 44 min    Activity Tolerance Patient tolerated treatment well    Behavior During Therapy [facility] for tasks assessed/performed              Past Medical History:  Diagnosis Date   Allergy    Anemia    Complication of anesthesia    per pt hard to wake   GERD (gastroesophageal reflux disease)    Heart murmur    Hematuria    History of exercise stress test    ETT 03-09-2016 in careeverywhere in epic-- result negative   History of gestational hypertension 07/2017   preeclamptic   History of kidney stones    History of pre-eclampsia    10/ 2018;  2010   History of rhabdomyolysis 12/2013   History of supraventricular tachycardia    per pt hx one episode , last halter montior  05/ 2017 note svt ectopy x4 in careeverywhere from [facility] in [place]   HSV-1 (herpes simplex virus 1) infection    Lactose intolerance    Migraine    Migraines    Mild asthma    prn inhaler   Mitral valve prolapse    per pt dx mvp yrs ago and has been followed cardiologist---  last office visit 05/ 2017 [facility] in [place].  in careeverywhere   MTHFR mutation    Neuromuscular disorder (HCC)    Palpitations    Raynauds syndrome    took procardia   Rheumatic fever    Swallowing difficulty    Wears contact lenses    Past Surgical History:  Procedure Laterality Date   ANTERIOR CRUCIATE LIGAMENT REPAIR Right 10/2015   CYSTOSCOPY/URETEROSCOPY/HOLMIUM LASER/STENT PLACEMENT Bilateral 12/24/2017   Procedure: CYSTOSCOPY/RETROGRADE/URETEROSCOPY/HOLMIUM LASER LITHOTRIPSY, STONE BASKETRY /STENT PLACEMENT;  Surgeon: Rene Paci, MD;  Location:  [facility];  Service: Urology;  Laterality: Bilateral;   HOLMIUM LASER APPLICATION Bilateral 12/24/2017   Procedure: HOLMIUM LASER APPLICATION;  Surgeon: Rene Paci, MD;  Location: [facility];  Service: Urology;  Laterality: Bilateral;   KIDNEY STONE SURGERY     NASAL SEPTUM SURGERY  02/2013   NASAL SINUS SURGERY     TOE SOFT TISSUE RELEASE W/ PINNING  2020   5th toe   TONSILLECTOMY  1991   WISDOM TOOTH EXTRACTION     Patient Active Problem List   Diagnosis Date Noted   Dysphagia 07/27/2021   Pain in joint, shoulder region 07/27/2021   Rash and other nonspecific skin eruption 07/27/2021   Sinusitis 07/27/2021   Superior glenoid labrum lesion 07/27/2021   Vitamin B12 deficiency 07/27/2021   Vitamin D deficiency 06/16/2020   Prediabetes 05/05/2020   Stress 05/05/2020   Class 1 obesity with serious comorbidity and body mass index (BMI) of 31.0 to 31.9 in adult 05/05/2020   Attention deficit disorder 05/03/2020   History of nephrolithiasis 04/05/2020   Pelvic pain 12/22/2017   SVD (spontaneous vaginal delivery) / [name] 11/21 09/12/2017   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  Rationale for Evaluation and Treatment Rehabilitation  ONSET DATE: Jan. 2023  SUBJECTIVE:                                                                                                                                                                                       SUBJECTIVE STATEMENT:  Pt states her pain is around the same level as at prior session/eval. She has had a decrease in neuro sx and has been working on gentle motion and addressing tender muscle areas. She traveled over the weekend which exacerbated her ain a bit.    PERTINENT HISTORY: -She reports a possible subluxation about 7 years ago -GERD, migraines, heart palpitations, Raynauds  PAIN:  Are you having pain? Yes: NPRS scale: 2/10 - goes up to 5/6 6/26 Pain location: Anterior shoulder Pain description: Ache, numbness/tingling at night into 3-5 digits Aggravating factors: Overhead reaching/lifting Relieving factors: Heat, NSAIDs  PRECAUTIONS: None  WEIGHT BEARING RESTRICTIONS No  FALLS:  Has patient fallen in last 6 months? No  LIVING ENVIRONMENT: Lives with: lives with their family and lives with their spouse Lives in: House/apartment  OCCUPATION: Nurse  PLOF: Independent  PATIENT GOALS  Manage pain better,   OBJECTIVE:     TODAY'S TREATMENT:   7/11 Trigger Point Dry-Needling  Treatment instructions: Expect mild to moderate muscle soreness. S/S of pneumothorax if dry needled over a lung field, and to seek immediate medical attention should they occur. Patient verbalized understanding of these instructions and education.  Patient Consent Given: Yes Education handout provided: Yes Muscles treated:   Electrical stimulation performed: No Parameters: N/A Treatment response/outcome:  Manual - STM/ TrP release to R upper trap, suboccipitals Shoulder row - 2x12, red band Shoulder extension - 2x12, red band  Doorway stretch 3x20 sec hold      6/26 Trigger Point Dry-Needling  Treatment instructions: Expect mild to moderate muscle soreness. S/S of pneumothorax if dry needled over a lung field, and to seek immediate medical attention should they occur. Patient verbalized understanding of these instructions and education.  Patient Consent  Given: Yes Education handout provided: Yes Muscles treated: right upper trap 3 spots using a .30x50  Electrical stimulation performed: No Parameters: N/A Treatment response/outcome: great twitch with needle 2 and 3    PATIENT EDUCATION: Education details:  Plan of care, home modality use, plan of care Person educated: Patient Education method: Explanation Education comprehension: verbalized understanding   HOME EXERCISE PROGRAM: Access Code: [email or web address] URL: [email or web address] Date: 04/16/2022 Prepared by: Lorayne Bender  Exercises - Seated Upper Trapezius Stretch  - 1 x daily - 7 x weekly - 3 sets - 10 reps - Gentle Levator Scapulae Stretch  - 1 x daily - 7 x weekly - 3 sets - 10 reps - Standing Bilateral Low Shoulder Row with Anchored Resistance  - 1 x daily - 7 x weekly - 3 sets - 10 reps - Shoulder extension with resistance - Neutral  - 1 x daily - 7 x weekly - 3 sets - 10 reps  ASSESSMENT:  CLINICAL IMPRESSION: Patient responded well to TrPDN and STM mobilizations - she reported less tension and stiffness in posterior shoulder and neck. She tolerated exercsies well today - able to incorporate ER and scaption strengthening without increase in pain. She reports some discomfort over anterior shoulder with extension and pec stretch. She also reports some numbness/tingling along ulnar nerve pattern in hand while holding dumbbell. She will continue to benefit from skilled therapy to address remaining impairments and return to PLOF.    OBJECTIVE IMPAIRMENTS decreased activity tolerance, decreased ROM, decreased strength, increased muscle spasms, and impaired UE functional use.   ACTIVITY LIMITATIONS carrying, lifting, and reach over head  PARTICIPATION LIMITATIONS: cleaning and occupation  PERSONAL FACTORS Age, Fitness, Past/current experiences, and Time since onset of injury/illness/exacerbation are also affecting patient's functional outcome.   REHAB POTENTIAL:  Good  CLINICAL DECISION MAKING: Evolving/moderate complexity  EVALUATION COMPLEXITY: Moderate   GOALS: Goals reviewed with patient? Yes  SHORT TERM GOALS: Target date: 7/14  Patient will demonstrate full and pain-free flexion and scaption AROM. Baseline: Goal status: INITIAL  2.  Patient will improve external rotation strength to within 10% of the contralateral side. Baseline:  Goal status: INITIAL  3.  Patient will be able to perform resisted elbow flexion without pain. Baseline:  Goal status: INITIAL   LONG TERM GOALS: Target date:8/4 Patient will tolerate motions of reaching behind the head and overhead w/lifting without any pain. Baseline:  Goal status: INITIAL  2.  Patient demonstrate flexion and abduction strength to within 10% of the contralateral side. Baseline:  Goal status: INITIAL  3.  Patient will be independent with gym and exercise routine to maintain current level of fitness. Baseline:  Goal status: INITIAL   PLAN: PT FREQUENCY: 1-2x/week  PT DURATION: 6 weeks  PLANNED INTERVENTIONS: Therapeutic exercises, Therapeutic activity, Neuromuscular re-education, Balance training, Gait training, Patient/Family education, Joint mobilization, Aquatic Therapy, Dry Needling, Cryotherapy, Moist heat, and Ionotophoresis 4mg /ml Dexamethasone  PLAN FOR NEXT SESSION: STM/DN, rotator cuff isometrics/easy strengthening, postural exercises. Screen ulnar nerve or TOS.   , Student-PT  During this treatment session, the therapist was present, participating in and directing the treatment.   Loura Halt, PT 05/01/2022, 2:56 PM   07/02/2022 PT DPT  05/01/2022  During this treatment session, the therapist was present, participating in and directing the treatment.

## 2022-05-03 ENCOUNTER — Encounter (HOSPITAL_BASED_OUTPATIENT_CLINIC_OR_DEPARTMENT_OTHER): Payer: Self-pay | Admitting: Physical Therapy

## 2022-05-03 ENCOUNTER — Ambulatory Visit (HOSPITAL_BASED_OUTPATIENT_CLINIC_OR_DEPARTMENT_OTHER): Payer: BC Managed Care – PPO | Admitting: Physical Therapy

## 2022-05-03 DIAGNOSIS — R252 Cramp and spasm: Secondary | ICD-10-CM

## 2022-05-03 DIAGNOSIS — M25511 Pain in right shoulder: Secondary | ICD-10-CM | POA: Diagnosis not present

## 2022-05-03 NOTE — Therapy (Signed)
OUTPATIENT PHYSICAL THERAPY SHOULDER TREATMENT   Patient Name: Shelby Norton MRN: [id number] DOB:07-Mar-1981, 41 y.o., female Today's Date: 05/03/2022   PT End of Session - 05/03/22 0954     Visit Number 4    Number of Visits 13    Date for PT Re-Evaluation 05/25/22    PT Start Time 0802    PT Stop Time 0843    PT Time Calculation (min) 41 min    Activity Tolerance Patient tolerated treatment well    Behavior During Therapy [facility] for tasks assessed/performed               Past Medical History:  Diagnosis Date   Allergy    Anemia    Complication of anesthesia    per pt hard to wake   GERD (gastroesophageal reflux disease)    Heart murmur    Hematuria    History of exercise stress test    ETT 03-09-2016 in careeverywhere in epic-- result negative   History of gestational hypertension 07/2017   preeclamptic   History of kidney stones    History of pre-eclampsia    10/ 2018;  2010   History of rhabdomyolysis 12/2013   History of supraventricular tachycardia    per pt hx one episode , last halter montior  05/ 2017 note svt ectopy x4 in careeverywhere from [facility] [place]   HSV-1 (herpes simplex virus 1) infection    Lactose intolerance    Migraine    Migraines    Mild asthma    prn inhaler   Mitral valve prolapse    per pt dx mvp yrs ago and has been followed cardiologist---  last office visit 05/ 2017 [facility] in [place].  in careeverywhere   MTHFR mutation    Neuromuscular disorder (HCC)    Palpitations    Raynauds syndrome    took procardia   Rheumatic fever    Swallowing difficulty    Wears contact lenses    Past Surgical History:  Procedure Laterality Date   ANTERIOR CRUCIATE LIGAMENT REPAIR Right 10/2015   CYSTOSCOPY/URETEROSCOPY/HOLMIUM LASER/STENT PLACEMENT Bilateral 12/24/2017   Procedure: CYSTOSCOPY/RETROGRADE/URETEROSCOPY/HOLMIUM LASER LITHOTRIPSY, STONE BASKETRY /STENT PLACEMENT;  Surgeon: Rene Paci, MD;   Location: [facility];  Service: Urology;  Laterality: Bilateral;   HOLMIUM LASER APPLICATION Bilateral 12/24/2017   Procedure: HOLMIUM LASER APPLICATION;  Surgeon: Rene Paci, MD;  Location: [facility];  Service: Urology;  Laterality: Bilateral;   KIDNEY STONE SURGERY     NASAL SEPTUM SURGERY  02/2013   NASAL SINUS SURGERY     TOE SOFT TISSUE RELEASE W/ PINNING  2020   5th toe   TONSILLECTOMY  1991   WISDOM TOOTH EXTRACTION     Patient Active Problem List   Diagnosis Date Noted   Dysphagia 07/27/2021   Pain in joint, shoulder region 07/27/2021   Rash and other nonspecific skin eruption 07/27/2021   Sinusitis 07/27/2021   Superior glenoid labrum lesion 07/27/2021   Vitamin B12 deficiency 07/27/2021   Vitamin D deficiency 06/16/2020   Prediabetes 05/05/2020   Stress 05/05/2020   Class 1 obesity with serious comorbidity and body mass index (BMI) of 31.0 to 31.9 in adult 05/05/2020   Attention deficit disorder 05/03/2020   History of nephrolithiasis 04/05/2020   Pelvic pain 12/22/2017   SVD (spontaneous vaginal delivery) / [name] 11/21 09/12/2017   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   S/P ACL reconstruction 01/04/2016   Raynaud's phenomenon 03/16/2015  Sjogren's syndrome (HCC) 03/16/2015   Mitral insufficiency and aortic stenosis 08/20/2014   Migraine 05/25/2014   Rhabdomyolysis 01/08/2014   Mild intermittent asthma without complication 10/02/2013   Allergic rhinitis 01/01/2013    PCP: Janeece Agee, NP  REFERRING PROVIDER: Janeece Agee, NP  REFERRING DIAG: [phone] (ICD-10-CM) - Chronic right shoulder pain  THERAPY DIAG:  No diagnosis found.  Rationale for Evaluation and Treatment Rehabilitation  ONSET DATE: Jan. 2023  SUBJECTIVE:                                                                                                                                                                                       SUBJECTIVE STATEMENT: Pt notes overall improvement at beginning of session today. She wasn't as sore from dry needling as previous session but feels some release in tension. She had an increase in pain following driving 2 hours for work - able to relieve with positioning changes and scap retractions.   PERTINENT HISTORY: -She reports a possible subluxation about 7 years ago -GERD, migraines, heart palpitations, Raynauds  PAIN:  Are you having pain? Yes: NPRS scale: 2/10 - goes up to 5/6 6/26 Pain location: Anterior shoulder Pain description: Ache, numbness/tingling at night into 3-5 digits Aggravating factors: Overhead reaching/lifting Relieving factors: Heat, NSAIDs  PRECAUTIONS: None  WEIGHT BEARING RESTRICTIONS No  FALLS:  Has patient fallen in last 6 months? No  LIVING ENVIRONMENT: Lives with: lives with their family and lives with their spouse Lives in: House/apartment  OCCUPATION: Nurse  PLOF: Independent  PATIENT GOALS  Manage pain better,   OBJECTIVE:     TODAY'S TREATMENT:   7/13 Manual - STM/ TrP release to R upper trap, supraspinatus McConnell taping applied to the R upper trap for inhibition  Bil shoulder ER - 2x12, red band Shoulder row - 2x12, green band Shoulder extension - 2x12, green band Bil scaption - x15 1lb   7/11 Trigger Point Dry-Needling  Treatment instructions: Expect mild to moderate muscle soreness. S/S of pneumothorax if dry needled over a lung field, and to seek immediate medical attention should they occur. Patient verbalized understanding of these instructions and education.  Patient Consent Given: Yes Education handout provided: Yes Muscles treated:   Electrical stimulation performed: No Parameters: N/A Treatment response/outcome:  Manual - STM/ TrP release to R upper trap, suboccipitals Shoulder row - 2x12, red band Shoulder extension - 2x12, red band  Doorway stretch 3x20 sec hold       6/26 Trigger Point Dry-Needling  Treatment instructions: Expect mild to moderate muscle soreness. S/S of pneumothorax if dry needled over a lung field, and to seek immediate medical attention should they occur.  Patient verbalized understanding of these instructions and education.  Patient Consent Given: Yes Education handout provided: Yes Muscles treated: right upper trap 3 spots using a .30x50  Electrical stimulation performed: No Parameters: N/A Treatment response/outcome: great twitch with needle 2 and 3    PATIENT EDUCATION: Education details: Plan of care, home modality use, plan of care Person educated: Patient Education method: Explanation Education comprehension: verbalized understanding   HOME EXERCISE PROGRAM: Access Code: [email or web address] URL: [email or web address] Date: 04/16/2022 Prepared by: Lorayne Bender  Exercises - Seated Upper Trapezius Stretch  - 1 x daily - 7 x weekly - 3 sets - 10 reps - Gentle Levator Scapulae Stretch  - 1 x daily - 7 x weekly - 3 sets - 10 reps - Standing Bilateral Low Shoulder Row with Anchored Resistance  - 1 x daily - 7 x weekly - 3 sets - 10 reps - Shoulder extension with resistance - Neutral  - 1 x daily - 7 x weekly - 3 sets - 10 reps  ASSESSMENT:  CLINICAL IMPRESSION: Patient is making good progress overall - she is less tender to palpation over posterior shoulder and UT/supraspinatus. She has good scapular control with exercises and feels like her shoulder blade is elevating less from tape. Anterior shld pain has improved with gentle stretching and home exercises. Patient will continue to benefit from skilled therapy to address impairments and improve function.   OBJECTIVE IMPAIRMENTS decreased activity tolerance, decreased ROM, decreased strength, increased muscle spasms, and impaired UE functional use.   ACTIVITY LIMITATIONS carrying, lifting, and reach over head  PARTICIPATION LIMITATIONS: cleaning and  occupation  PERSONAL FACTORS Age, Fitness, Past/current experiences, and Time since onset of injury/illness/exacerbation are also affecting patient's functional outcome.   REHAB POTENTIAL: Good  CLINICAL DECISION MAKING: Evolving/moderate complexity  EVALUATION COMPLEXITY: Moderate   GOALS: Goals reviewed with patient? Yes  SHORT TERM GOALS: Target date: 7/14  Patient will demonstrate full and pain-free flexion and scaption AROM. Baseline: Goal status: INITIAL  2.  Patient will improve external rotation strength to within 10% of the contralateral side. Baseline:  Goal status: INITIAL  3.  Patient will be able to perform resisted elbow flexion without pain. Baseline:  Goal status: INITIAL   LONG TERM GOALS: Target date:8/4 Patient will tolerate motions of reaching behind the head and overhead w/lifting without any pain. Baseline:  Goal status: INITIAL  2.  Patient demonstrate flexion and abduction strength to within 10% of the contralateral side. Baseline:  Goal status: INITIAL  3.  Patient will be independent with gym and exercise routine to maintain current level of fitness. Baseline:  Goal status: INITIAL   PLAN: PT FREQUENCY: 1-2x/week  PT DURATION: 6 weeks  PLANNED INTERVENTIONS: Therapeutic exercises, Therapeutic activity, Neuromuscular re-education, Balance training, Gait training, Patient/Family education, Joint mobilization, Aquatic Therapy, Dry Needling, Cryotherapy, Moist heat, and Ionotophoresis 4mg /ml Dexamethasone  PLAN FOR NEXT SESSION: STM/DN, rotator cuff isometrics/easy strengthening, postural exercises. Screen ulnar nerve.   , Student-PT  Loura Halt, PT 05/03/2022, 11:11 AM  During this treatment session, the therapist was present, participating in and directing the treatment.

## 2022-05-08 ENCOUNTER — Encounter (HOSPITAL_BASED_OUTPATIENT_CLINIC_OR_DEPARTMENT_OTHER): Payer: Self-pay | Admitting: Physical Therapy

## 2022-05-08 ENCOUNTER — Ambulatory Visit (HOSPITAL_BASED_OUTPATIENT_CLINIC_OR_DEPARTMENT_OTHER): Payer: BC Managed Care – PPO | Admitting: Physical Therapy

## 2022-05-08 DIAGNOSIS — M25511 Pain in right shoulder: Secondary | ICD-10-CM

## 2022-05-08 DIAGNOSIS — R252 Cramp and spasm: Secondary | ICD-10-CM

## 2022-05-08 NOTE — Therapy (Signed)
OUTPATIENT PHYSICAL THERAPY SHOULDER TREATMENT   Patient Name: Shelby Norton MRN: [id number] DOB:1981/01/22, 41 y.o., female Today's Date: 05/08/2022   PT End of Session - 05/08/22 1002     Visit Number 5    Number of Visits 13    Date for PT Re-Evaluation 05/25/22    PT Start Time 0942   12 min late   PT Stop Time 1015    PT Time Calculation (min) 33 min    Activity Tolerance Patient tolerated treatment well    Behavior During Therapy [facility] for tasks assessed/performed               Past Medical History:  Diagnosis Date   Allergy    Anemia    Complication of anesthesia    per pt hard to wake   GERD (gastroesophageal reflux disease)    Heart murmur    Hematuria    History of exercise stress test    ETT 03-09-2016 in careeverywhere in epic-- result negative   History of gestational hypertension 07/2017   preeclamptic   History of kidney stones    History of pre-eclampsia    10/ 2018;  2010   History of rhabdomyolysis 12/2013   History of supraventricular tachycardia    per pt hx one episode , last halter montior  05/ 2017 note svt ectopy x4 in careeverywhere from [facility] in [place]   HSV-1 (herpes simplex virus 1) infection    Lactose intolerance    Migraine    Migraines    Mild asthma    prn inhaler   Mitral valve prolapse    per pt dx mvp yrs ago and has been followed cardiologist---  last office visit 05/ 2017 [facility] in [place].  in careeverywhere   MTHFR mutation    Neuromuscular disorder (HCC)    Palpitations    Raynauds syndrome    took procardia   Rheumatic fever    Swallowing difficulty    Wears contact lenses    Past Surgical History:  Procedure Laterality Date   ANTERIOR CRUCIATE LIGAMENT REPAIR Right 10/2015   CYSTOSCOPY/URETEROSCOPY/HOLMIUM LASER/STENT PLACEMENT Bilateral 12/24/2017   Procedure: CYSTOSCOPY/RETROGRADE/URETEROSCOPY/HOLMIUM LASER LITHOTRIPSY, STONE BASKETRY /STENT PLACEMENT;  Surgeon: Rene Paci,  MD;  Location: [facility];  Service: Urology;  Laterality: Bilateral;   HOLMIUM LASER APPLICATION Bilateral 12/24/2017   Procedure: HOLMIUM LASER APPLICATION;  Surgeon: Rene Paci, MD;  Location: [facility];  Service: Urology;  Laterality: Bilateral;   KIDNEY STONE SURGERY     NASAL SEPTUM SURGERY  02/2013   NASAL SINUS SURGERY     TOE SOFT TISSUE RELEASE W/ PINNING  2020   5th toe   TONSILLECTOMY  1991   WISDOM TOOTH EXTRACTION     Patient Active Problem List   Diagnosis Date Noted   Dysphagia 07/27/2021   Pain in joint, shoulder region 07/27/2021   Rash and other nonspecific skin eruption 07/27/2021   Sinusitis 07/27/2021   Superior glenoid labrum lesion 07/27/2021   Vitamin B12 deficiency 07/27/2021   Vitamin D deficiency 06/16/2020   Prediabetes 05/05/2020   Stress 05/05/2020   Class 1 obesity with serious comorbidity and body mass index (BMI) of 31.0 to 31.9 in adult 05/05/2020   Attention deficit disorder 05/03/2020   History of nephrolithiasis 04/05/2020   Pelvic pain 12/22/2017   SVD (spontaneous vaginal delivery) / [name] 11/21 09/12/2017   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   S/P ACL reconstruction 01/04/2016   Raynaud's  phenomenon 03/16/2015   Sjogren's syndrome (HCC) 03/16/2015   Mitral insufficiency and aortic stenosis 08/20/2014   Migraine 05/25/2014   Rhabdomyolysis 01/08/2014   Mild intermittent asthma without complication 10/02/2013   Allergic rhinitis 01/01/2013    PCP: Janeece Agee, NP  REFERRING PROVIDER: Janeece Agee, NP  REFERRING DIAG: [phone] (ICD-10-CM) - Chronic right shoulder pain  THERAPY DIAG:  Acute pain of right shoulder  Cramp and spasm  Rationale for Evaluation and Treatment Rehabilitation  ONSET DATE: Jan. 2023  SUBJECTIVE:                                                                                                                                                                                       SUBJECTIVE STATEMENT: Pt notes continued improvement. She found the tape to be helpful and has noticed upper trap is less tense. She has been adherent to HEP - says holding weights is reproducing numbness in hand.   PERTINENT HISTORY: -She reports a possible subluxation about 7 years ago -GERD, migraines, heart palpitations, Raynauds  PAIN:  Are you having pain? Yes: NPRS scale: 2/10 - goes up to 5/6 6/26 Pain location: Anterior shoulder Pain description: Ache, numbness/tingling at night into 3-5 digits Aggravating factors: Overhead reaching/lifting Relieving factors: Heat, NSAIDs  PRECAUTIONS: None  WEIGHT BEARING RESTRICTIONS No  FALLS:  Has patient fallen in last 6 months? No  LIVING ENVIRONMENT: Lives with: lives with their family and lives with their spouse Lives in: House/apartment  OCCUPATION: Nurse  PLOF: Independent  PATIENT GOALS  Manage pain better,   OBJECTIVE:     TODAY'S TREATMENT:   7/18 Trigger Point Dry-Needling  Treatment instructions: Expect mild to moderate muscle soreness. S/S of pneumothorax if dry needled over a lung field, and to seek immediate medical attention should they occur. Patient verbalized understanding of these instructions and education.  Patient Consent Given: Yes Education handout provided: Yes Muscles treated:   Electrical stimulation performed: No Parameters: N/A Treatment response/outcome:  Manual - STM/ TrP release to R upper trap, teres major/infraspinatus Prone T w/bent elbow, W, Y - x15 each with cue for scap retraction/depression  Patient given cover roll.   7/13 Manual - STM/ TrP release to R upper trap, supraspinatus McConnell taping applied to the R upper trap for inhibition  Bil shoulder ER - 2x12, red band Shoulder row - 2x12, green band Shoulder extension - 2x12, green band Bil scaption - x15 1lb   7/11 Trigger Point Dry-Needling  Treatment  instructions: Expect mild to moderate muscle soreness. S/S of pneumothorax if dry needled over a lung field, and to seek immediate medical attention should they occur. Patient verbalized understanding of  these instructions and education.  Patient Consent Given: Yes Education handout provided: Yes Muscles treated:   Electrical stimulation performed: No Parameters: N/A Treatment response/outcome:  Manual - STM/ TrP release to R upper trap, suboccipitals Shoulder row - 2x12, red band Shoulder extension - 2x12, red band  Doorway stretch 3x20 sec hold       PATIENT EDUCATION: Education details: Plan of care, home modality use, plan of care Person educated: Patient Education method: Explanation Education comprehension: verbalized understanding   HOME EXERCISE PROGRAM: Access Code: [email or web address] URL: [email or web address] Date: 04/16/2022 Prepared by: Lorayne Bender  Exercises - Seated Upper Trapezius Stretch  - 1 x daily - 7 x weekly - 3 sets - 10 reps - Gentle Levator Scapulae Stretch  - 1 x daily - 7 x weekly - 3 sets - 10 reps - Standing Bilateral Low Shoulder Row with Anchored Resistance  - 1 x daily - 7 x weekly - 3 sets - 10 reps - Shoulder extension with resistance - Neutral  - 1 x daily - 7 x weekly - 3 sets - 10 reps  ASSESSMENT:  CLINICAL IMPRESSION: Patient continued to make steady progress. She is less tender to palpation and reports pain is radiating into shoulder/arm less. She demonstrates good scapular control with prone activities and is able to self-correct elevation. She will continue to benefit from skilled therapy to address remaining impairments and improve function. We focused more on soft tissue mobilization to the infraspinatus today. The referral pattern for that muscle is down into the hand. We may needle next visit if she continues to have a trigger point.    OBJECTIVE IMPAIRMENTS decreased activity tolerance, decreased ROM, decreased strength,  increased muscle spasms, and impaired UE functional use.   ACTIVITY LIMITATIONS carrying, lifting, and reach over head  PARTICIPATION LIMITATIONS: cleaning and occupation  PERSONAL FACTORS Age, Fitness, Past/current experiences, and Time since onset of injury/illness/exacerbation are also affecting patient's functional outcome.   REHAB POTENTIAL: Good  CLINICAL DECISION MAKING: Evolving/moderate complexity  EVALUATION COMPLEXITY: Moderate   GOALS: Goals reviewed with patient? Yes  SHORT TERM GOALS: Target date: 7/14  Patient will demonstrate full and pain-free flexion and scaption AROM. Baseline: Goal status: INITIAL  2.  Patient will improve external rotation strength to within 10% of the contralateral side. Baseline:  Goal status: INITIAL  3.  Patient will be able to perform resisted elbow flexion without pain. Baseline:  Goal status: INITIAL   LONG TERM GOALS: Target date:8/4 Patient will tolerate motions of reaching behind the head and overhead w/lifting without any pain. Baseline:  Goal status: INITIAL  2.  Patient demonstrate flexion and abduction strength to within 10% of the contralateral side. Baseline:  Goal status: INITIAL  3.  Patient will be independent with gym and exercise routine to maintain current level of fitness. Baseline:  Goal status: INITIAL   PLAN: PT FREQUENCY: 1-2x/week  PT DURATION: 6 weeks  PLANNED INTERVENTIONS: Therapeutic exercises, Therapeutic activity, Neuromuscular re-education, Balance training, Gait training, Patient/Family education, Joint mobilization, Aquatic Therapy, Dry Needling, Cryotherapy, Moist heat, and Ionotophoresis 4mg /ml Dexamethasone  PLAN FOR NEXT SESSION: STM/DN, rotator cuff isometrics/easy strengthening, postural exercises. Screen ulnar nerve.   , Student-PT  Loura Halt, PT 05/08/2022, 10:04 AM  During this treatment session, the therapist was present, participating in and directing  the treatment.

## 2022-05-10 ENCOUNTER — Ambulatory Visit (HOSPITAL_BASED_OUTPATIENT_CLINIC_OR_DEPARTMENT_OTHER): Payer: BC Managed Care – PPO | Admitting: Physical Therapy

## 2022-05-14 ENCOUNTER — Other Ambulatory Visit (HOSPITAL_BASED_OUTPATIENT_CLINIC_OR_DEPARTMENT_OTHER): Payer: Self-pay

## 2022-05-21 ENCOUNTER — Ambulatory Visit (INDEPENDENT_AMBULATORY_CARE_PROVIDER_SITE_OTHER): Payer: BC Managed Care – PPO | Admitting: Orthopaedic Surgery

## 2022-05-21 DIAGNOSIS — S46811A Strain of other muscles, fascia and tendons at shoulder and upper arm level, right arm, initial encounter: Secondary | ICD-10-CM

## 2022-05-21 NOTE — Progress Notes (Signed)
Chief Complaint: Right shoulder pain     History of Present Illness:   05/21/2022: Presents today for follow-up of the right shoulder.  Overall she is feeling much better following an injection and physical therapy.  She continues to work on strengthening of the shoulder.  At this time she has essentially no pain except for driving longer distances  Shelby Norton is a 41 y.o. female right-hand-dominant presents with right shoulder pain which has been ongoing since January of this year although has actually been happening for multiple years.  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  She states that the ergonomics of this subsequently led to significant shoulder pain.  This was overall manageable.  She was using ibuprofen and Tylenol.  She does also endorse popping and clicking in the shoulder.  She states that in January 2023 she was at her son's rockclimbing birthday party and since that time has had significant shoulder pain that has been limiting her in most activities of daily living.  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  States that the pain is dull always but occasionally sharp when lifting overhead.  She continues to take ibuprofen for pain although is here today for further assessment as she continues to have pain.    Surgical History:   None  PMH/PSH/Family History/Social History/Meds/Allergies:    Past Medical History:  Diagnosis Date   Allergy    Anemia    Complication of anesthesia    per pt hard to wake   GERD (gastroesophageal reflux disease)    Heart murmur    Hematuria    History of exercise stress test    ETT 03-09-2016 in careeverywhere in epic-- result negative   History of gestational hypertension 07/2017   preeclamptic   History of kidney stones    History  of pre-eclampsia    10/ 2018;  2010   History of rhabdomyolysis 12/2013   History of supraventricular tachycardia    per pt hx one episode , last halter montior  05/ 2017 note svt ectopy x4 in careeverywhere from [facility] in [place]   HSV-1 (herpes simplex virus 1) infection    Lactose intolerance    Migraine    Migraines    Mild asthma    prn inhaler   Mitral valve prolapse    per pt dx mvp yrs ago and has been followed cardiologist---  last office visit 05/ 2017 [facility] in [place].  in careeverywhere   MTHFR mutation    Neuromuscular disorder (HCC)    Palpitations    Raynauds syndrome    took procardia   Rheumatic fever    Swallowing difficulty    Wears contact lenses    Past Surgical History:  Procedure Laterality Date   ANTERIOR CRUCIATE LIGAMENT REPAIR Right 10/2015   CYSTOSCOPY/URETEROSCOPY/HOLMIUM LASER/STENT PLACEMENT Bilateral 12/24/2017   Procedure: CYSTOSCOPY/RETROGRADE/URETEROSCOPY/HOLMIUM LASER LITHOTRIPSY, STONE BASKETRY /STENT PLACEMENT;  Surgeon: Rene Paci, MD;  Location: [facility];  Service: Urology;  Laterality: Bilateral;   HOLMIUM LASER APPLICATION Bilateral 12/24/2017   Procedure: HOLMIUM LASER APPLICATION;  Surgeon: Rene Paci, MD;  Location: [facility];  Service: Urology;  Laterality: Bilateral;   KIDNEY STONE SURGERY     NASAL SEPTUM SURGERY  02/2013   NASAL SINUS SURGERY     TOE SOFT TISSUE RELEASE W/ PINNING  2020   5th toe   TONSILLECTOMY  1991   WISDOM TOOTH EXTRACTION     Social History   Socioeconomic History   Marital status: Married    Spouse name: Not on file   Number of children: 2   Years of education: Not on file   Highest education level: Not on file  Occupational History   Occupation: RN  Tobacco Use   Smoking status: Never   Smokeless tobacco: Never  Vaping Use   Vaping Use: Never used  Substance and Sexual Activity   Alcohol use: Yes    Comment:  occ   Drug use: No   Sexual activity: Yes    Birth control/protection: None  Other Topics Concern   Not on file  Social History Narrative   Not on file   Social Determinants of Health   Financial Resource Strain: Not on file  Food Insecurity: Not on file  Transportation Needs: Not on file  Physical Activity: Not on file  Stress: Not on file  Social Connections: Not on file   Family History  Problem Relation Age of Onset   Diabetes Mother    Hypertension Mother    Hyperlipidemia Mother    Obesity Mother    Diabetes Father    Hypertension Father    Other Father        TIA   Stroke Father    Heart disease Father    Kidney disease Father    Liver disease Father    Alcoholism Father    Obesity Father    Obstructive Sleep Apnea Father    Diabetes Brother    Lung cancer Maternal Grandmother        chronic secondhand smoke exposure   Brain cancer Maternal Grandmother    Breast cancer Maternal Grandmother    Stroke Paternal Grandfather    Allergies  Allergen Reactions   Aspirin Hives   Other Anaphylaxis    hazelnuts   Codeine Nausea And Vomiting and Swelling   Hydrocodone Nausea And Vomiting and Swelling   Sulfamethoxazole Nausea And Vomiting   Betamethasone Rash    On face and neck   Current Outpatient Medications  Medication Sig Dispense Refill   albuterol (PROVENTIL HFA;VENTOLIN HFA) 108 (90 Base) MCG/ACT inhaler Inhale 1-2 puffs into the lungs every 6 (six) hours as needed for wheezing or shortness of breath.     amphetamine-dextroamphetamine (ADDERALL XR) 10 MG 24 hr capsule Take 1 capsule (10 mg total) by mouth daily. (Patient not taking: No sig reported) 30 capsule 0   amphetamine-dextroamphetamine (ADDERALL XR) 10 MG 24 hr capsule Take 1 capsule (10 mg total) by mouth daily. (Patient not taking: Reported on 04/13/2022) 30 capsule 0   amphetamine-dextroamphetamine (ADDERALL XR) 10 MG 24 hr capsule Take 1 capsule (10 mg total) by mouth daily. 90 capsule 0    azithromycin (ZITHROMAX) 250 MG tablet Take two tablets on the first day and then one tablet every day after. (Patient not taking: Reported on 04/13/2022) 6 tablet 0   benzonatate (TESSALON) 100 MG capsule Take 1 capsule (100 mg total) by mouth 3 (three) times daily as needed for up to 10 days. (Patient not taking: Reported on 04/13/2022) 30 capsule 0   benzonatate (TESSALON) 200 MG capsule  Take 1 capsule (200 mg total) by mouth 3 (three)  times daily as needed for up to 7 days for Cough. (Patient not taking: Reported on 04/13/2022) 20 capsule 0   buPROPion (WELLBUTRIN SR) 150 MG 12 hr tablet Take 1 tablet (150 mg total) by mouth 2 (two) times daily. (Patient not taking: Reported on 04/13/2022) 90 tablet 1   clonazePAM (KLONOPIN) 0.5 MG disintegrating tablet Take 1 tablet (0.5 mg total) by mouth 2 (two) times daily. 30 tablet 0   EPINEPHrine 0.3 mg/0.3 mL IJ SOAJ injection Inject 0.3 mg into the skin daily as needed. For allergic reaction (Patient not taking: Reported on 04/13/2022)     methylPREDNISolone (MEDROL DOSEPAK) 4 MG TBPK tablet Follow package directions (Patient not taking: Reported on 04/13/2022) 21 tablet 0   predniSONE (DELTASONE) 10 MG tablet Take as directed on dose pack for 6 days. See attached sheet. (Patient not taking: Reported on 04/13/2022) 21 tablet 0   Probiotic Product (PROBIOTIC PO) Take 2 capsules by mouth daily. (Patient not taking: Reported on 04/13/2022)     topiramate (TOPAMAX) 25 MG tablet Take 1 tablet (25 mg total) by mouth daily. 90 tablet 1   vitamin C (ASCORBIC ACID) 500 MG tablet Take 500 mg by mouth daily. (Patient not taking: Reported on 04/13/2022)     Vitamin D, Ergocalciferol, (DRISDOL) 1.25 MG (50000 UNIT) CAPS capsule Take 1 capsule (50,000 Units total) by mouth every 7 (seven) days. 12 capsule 0   zinc gluconate 50 MG tablet Take 50 mg by mouth daily.     No current facility-administered medications for this visit.   No results found.  Review of Systems:   A  ROS was performed including pertinent positives and negatives as documented in the HPI.  Physical Exam :   Constitutional: NAD and appears stated age Neurological: Alert and oriented Psych: Appropriate affect and cooperative There were no vitals taken for this visit.   Comprehensive Musculoskeletal Exam:    Musculoskeletal Exam    Inspection Right Left  Skin No atrophy or winging No atrophy or winging  Palpation    Tenderness None None  Range of Motion    Flexion (passive) 170 170  Flexion (active) 170 170  Abduction 170 170  ER at the side 70 70  Can reach behind back to T12 T12  Strength     Full Full  Special Tests    Pseudoparalytic No No  Neurologic    Fires PIN, radial, median, ulnar, musculocutaneous, axillary, suprascapular, long thoracic, and spinal accessory innervated muscles. No abnormal sensibility  Vascular/Lymphatic    Radial Pulse 2+ 2+  Cervical Exam    Patient has symmetric cervical range of motion with negative Spurling's test.  Special Test:      Imaging:   Xray (3 views right shoulder): Normal  MRI right shoulder: There is a small interstitial tearing involving the anterior cable of the supraspinatus with a bone cyst in this area of the greater tuberosity  I personally reviewed and interpreted the radiographs.   Assessment:   41 y.o. female right-hand-dominant with right shoulder pain in the setting of a small tear of the supraspinatus tendon.  Overall today she feels much better.  She has essentially no pain.  I will plan to see her back on an as-needed basis  Plan :    -Plan for return to clinic as needd   I personally saw and evaluated the patient, and participated in the management and treatment plan.  Viviann Spare  Sammuel Hines, MD Attending Physician, Orthopedic Surgery  This document was dictated using Dragon voice recognition software. A reasonable attempt at proof reading has been made to minimize errors.

## 2022-05-23 ENCOUNTER — Ambulatory Visit (HOSPITAL_BASED_OUTPATIENT_CLINIC_OR_DEPARTMENT_OTHER): Payer: BC Managed Care – PPO | Attending: Orthopaedic Surgery | Admitting: Physical Therapy

## 2022-05-23 ENCOUNTER — Encounter (HOSPITAL_BASED_OUTPATIENT_CLINIC_OR_DEPARTMENT_OTHER): Payer: Self-pay | Admitting: Physical Therapy

## 2022-05-23 DIAGNOSIS — M25511 Pain in right shoulder: Secondary | ICD-10-CM | POA: Insufficient documentation

## 2022-05-23 DIAGNOSIS — R252 Cramp and spasm: Secondary | ICD-10-CM | POA: Diagnosis present

## 2022-05-23 NOTE — Therapy (Signed)
OUTPATIENT PHYSICAL THERAPY SHOULDER TREATMENT   Patient Name: Shelby Norton MRN: [id number] DOB:Oct 04, 1981, 41 y.o., female Today's Date: 05/23/2022   PT End of Session - 05/23/22 0849     Visit Number 6    Number of Visits 13    Date for PT Re-Evaluation 05/25/22    PT Start Time 0845    PT Stop Time 0928    PT Time Calculation (min) 43 min    Activity Tolerance Patient tolerated treatment well    Behavior During Therapy [facility] for tasks assessed/performed               Past Medical History:  Diagnosis Date   Allergy    Anemia    Complication of anesthesia    per pt hard to wake   GERD (gastroesophageal reflux disease)    Heart murmur    Hematuria    History of exercise stress test    ETT 03-09-2016 in careeverywhere in epic-- result negative   History of gestational hypertension 07/2017   preeclamptic   History of kidney stones    History of pre-eclampsia    10/ 2018;  2010   History of rhabdomyolysis 12/2013   History of supraventricular tachycardia    per pt hx one episode , last halter montior  05/ 2017 note svt ectopy x4 in careeverywhere from [facility] in [place]   HSV-1 (herpes simplex virus 1) infection    Lactose intolerance    Migraine    Migraines    Mild asthma    prn inhaler   Mitral valve prolapse    per pt dx mvp yrs ago and has been followed cardiologist---  last office visit 05/ 2017 [facility] in [place].  in careeverywhere   MTHFR mutation    Neuromuscular disorder (HCC)    Palpitations    Raynauds syndrome    took procardia   Rheumatic fever    Swallowing difficulty    Wears contact lenses    Past Surgical History:  Procedure Laterality Date   ANTERIOR CRUCIATE LIGAMENT REPAIR Right 10/2015   CYSTOSCOPY/URETEROSCOPY/HOLMIUM LASER/STENT PLACEMENT Bilateral 12/24/2017   Procedure: CYSTOSCOPY/RETROGRADE/URETEROSCOPY/HOLMIUM LASER LITHOTRIPSY, STONE BASKETRY /STENT PLACEMENT;  Surgeon: Rene Paci, MD;  Location:  [facility];  Service: Urology;  Laterality: Bilateral;   HOLMIUM LASER APPLICATION Bilateral 12/24/2017   Procedure: HOLMIUM LASER APPLICATION;  Surgeon: Rene Paci, MD;  Location: [facility];  Service: Urology;  Laterality: Bilateral;   KIDNEY STONE SURGERY     NASAL SEPTUM SURGERY  02/2013   NASAL SINUS SURGERY     TOE SOFT TISSUE RELEASE W/ PINNING  2020   5th toe   TONSILLECTOMY  1991   WISDOM TOOTH EXTRACTION     Patient Active Problem List   Diagnosis Date Noted   Dysphagia 07/27/2021   Pain in joint, shoulder region 07/27/2021   Rash and other nonspecific skin eruption 07/27/2021   Sinusitis 07/27/2021   Superior glenoid labrum lesion 07/27/2021   Vitamin B12 deficiency 07/27/2021   Vitamin D deficiency 06/16/2020   Prediabetes 05/05/2020   Stress 05/05/2020   Class 1 obesity with serious comorbidity and body mass index (BMI) of 31.0 to 31.9 in adult 05/05/2020   Attention deficit disorder 05/03/2020   History of nephrolithiasis 04/05/2020   Pelvic pain 12/22/2017   SVD (spontaneous vaginal delivery) / [name] 11/21 09/12/2017   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 2023  SUBJECTIVE:                                                                                                                                                                                       SUBJECTIVE STATEMENT: Pt notes continued improvement. She found the tape to be helpful and has noticed upper trap is less tense. She has been adherent to HEP - says holding weights is reproducing numbness in hand.   PERTINENT HISTORY: -She reports a possible subluxation about 7 years ago -GERD, migraines, heart palpitations, Raynauds  PAIN:  Are you having pain? Yes: NPRS scale: 2/10 - goes up to 5/6 6/26 Pain location: Anterior shoulder Pain description: Ache, numbness/tingling at night into 3-5 digits Aggravating factors: Overhead reaching/lifting Relieving factors: Heat, NSAIDs  PRECAUTIONS: None  WEIGHT BEARING RESTRICTIONS No  FALLS:  Has patient fallen in last 6 months? No  LIVING ENVIRONMENT: Lives with: lives with their family and lives with their spouse Lives in: House/apartment  OCCUPATION: Nurse  PLOF: Independent  PATIENT GOALS  Manage pain better,   OBJECTIVE:     TODAY'S TREATMENT:  8/2 Trigger Point Dry-Needling  Treatment instructions: Expect mild to moderate muscle soreness. S/S of pneumothorax if dry needled over a lung field, and to seek immediate medical attention should they occur. Patient verbalized understanding of these instructions and education.  Patient Consent Given: Yes Education handout provided: Yes Muscles treated:  sub-scap 3x ; infraspinatus using the scapula as a back drop 2x using a .30x50 needle  Electrical stimulation performed: No Parameters: N/A Treatment response/outcome: only mild twtich felt with infraspinatus  Manual - STM/ TrP release to R upper trap, teres major/infraspinatus and skilled palpation of trigger points   Supine ABC x1 3lbs has a 5lb weight at home  SL ER 2x10   Pallof press 2x10 green     7/18 Trigger Point Dry-Needling  Treatment instructions: Expect mild to moderate muscle soreness. S/S of pneumothorax if dry needled over a lung field, and to seek  immediate medical attention should they occur. Patient verbalized understanding of these instructions and education.  Patient Consent Given: Yes Education handout provided: Yes Muscles treated:   Electrical stimulation performed:  No Parameters: N/A Treatment response/outcome:  Manual - STM/ TrP release to R upper trap, teres major/infraspinatus Prone T w/bent elbow, W, Y - x15 each with cue for scap retraction/depression  Patient given cover roll.   7/13 Manual - STM/ TrP release to R upper trap, supraspinatus McConnell taping applied to the R upper trap for inhibition  Bil shoulder ER - 2x12, red band Shoulder row - 2x12, green band Shoulder extension - 2x12, green band Bil scaption - x15 1lb   7/11 Trigger Point Dry-Needling  Treatment instructions: Expect mild to moderate muscle soreness. S/S of pneumothorax if dry needled over a lung field, and to seek immediate medical attention should they occur. Patient verbalized understanding of these instructions and education.  Patient Consent Given: Yes Education handout provided: Yes Muscles treated:   Electrical stimulation performed: No Parameters: N/A Treatment response/outcome:  Manual - STM/ TrP release to R upper trap, suboccipitals Shoulder row - 2x12, red band Shoulder extension - 2x12, red band  Doorway stretch 3x20 sec hold       PATIENT EDUCATION: Education details: Plan of care, home modality use, plan of care Person educated: Patient Education method: Explanation Education comprehension: verbalized understanding   HOME EXERCISE PROGRAM: Access Code: [email or web address] URL: [email or web address] Date: 04/16/2022 Prepared by: Lorayne Bender  Exercises - Seated Upper Trapezius Stretch  - 1 x daily - 7 x weekly - 3 sets - 10 reps - Gentle Levator Scapulae Stretch  - 1 x daily - 7 x weekly - 3 sets - 10 reps - Standing Bilateral Low Shoulder Row with Anchored Resistance  - 1 x daily - 7 x weekly - 3  sets - 10 reps - Shoulder extension with resistance - Neutral  - 1 x daily - 7 x weekly - 3 sets - 10 reps  ASSESSMENT:  CLINICAL IMPRESSION: The patient tolerated treatment well. We were unable to get a significant twitch from the needling, but she did have some change in her pain with trigger point release. We added supine ABC, side lying ER, and pallof press to her program. Therapy will continue to advance exercises as tolerated. She was given an updated HEP and therapy explained how to use it. She now has full series of exercises she can do.  OBJECTIVE IMPAIRMENTS decreased activity tolerance, decreased ROM, decreased strength, increased muscle spasms, and impaired UE functional use.   ACTIVITY LIMITATIONS carrying, lifting, and reach over head  PARTICIPATION LIMITATIONS: cleaning and occupation  PERSONAL FACTORS Age, Fitness, Past/current experiences, and Time since onset of injury/illness/exacerbation are also affecting patient's functional outcome.   REHAB POTENTIAL: Good  CLINICAL DECISION MAKING: Evolving/moderate complexity  EVALUATION COMPLEXITY: Moderate   GOALS: Goals reviewed with patient? Yes  SHORT TERM GOALS: Target date: 7/14  Patient will demonstrate full and pain-free flexion and scaption AROM. Baseline: Goal status: INITIAL  2.  Patient will improve external rotation strength to within 10% of the contralateral side. Baseline:  Goal status: INITIAL  3.  Patient will be able to perform resisted elbow flexion without pain. Baseline:  Goal status: INITIAL   LONG TERM GOALS: Target date:8/4 Patient will tolerate motions of reaching behind the head and overhead w/lifting without any pain. Baseline:  Goal status: INITIAL  2.  Patient demonstrate flexion and abduction strength to within 10% of the contralateral side. Baseline:  Goal status: INITIAL  3.  Patient will be independent with gym and exercise routine to maintain current level of  fitness. Baseline:  Goal status: INITIAL  PLAN: PT FREQUENCY: 1-2x/week  PT DURATION: 6 weeks  PLANNED INTERVENTIONS: Therapeutic exercises, Therapeutic activity, Neuromuscular re-education, Balance training, Gait training, Patient/Family education, Joint mobilization, Aquatic Therapy, Dry Needling, Cryotherapy, Moist heat, and Ionotophoresis 4mg /ml Dexamethasone  PLAN FOR NEXT SESSION: STM/DN, rotator cuff isometrics/easy strengthening, postural exercises. Screen ulnar nerve.     , PT 05/23/2022, 9:39 AM

## 2022-05-29 ENCOUNTER — Ambulatory Visit (HOSPITAL_BASED_OUTPATIENT_CLINIC_OR_DEPARTMENT_OTHER): Payer: BC Managed Care – PPO | Admitting: Physical Therapy

## 2022-05-29 ENCOUNTER — Other Ambulatory Visit: Payer: Self-pay | Admitting: Registered Nurse

## 2022-05-29 ENCOUNTER — Other Ambulatory Visit (HOSPITAL_BASED_OUTPATIENT_CLINIC_OR_DEPARTMENT_OTHER): Payer: Self-pay

## 2022-05-29 DIAGNOSIS — E559 Vitamin D deficiency, unspecified: Secondary | ICD-10-CM

## 2022-05-29 MED ORDER — VITAMIN D (ERGOCALCIFEROL) 1.25 MG (50000 UNIT) PO CAPS
50000.0000 [IU] | ORAL_CAPSULE | ORAL | 0 refills | Status: AC
  Filled 2022-09-10: qty 12, 84d supply, fill #0
  Filled 2023-04-23: qty 4, 28d supply, fill #0

## 2022-05-30 ENCOUNTER — Encounter (INDEPENDENT_AMBULATORY_CARE_PROVIDER_SITE_OTHER): Payer: Self-pay

## 2022-06-05 ENCOUNTER — Ambulatory Visit (HOSPITAL_BASED_OUTPATIENT_CLINIC_OR_DEPARTMENT_OTHER): Payer: BC Managed Care – PPO | Admitting: Physical Therapy

## 2022-06-05 ENCOUNTER — Encounter (HOSPITAL_BASED_OUTPATIENT_CLINIC_OR_DEPARTMENT_OTHER): Payer: Self-pay | Admitting: Physical Therapy

## 2022-06-05 DIAGNOSIS — R252 Cramp and spasm: Secondary | ICD-10-CM

## 2022-06-05 DIAGNOSIS — M25511 Pain in right shoulder: Secondary | ICD-10-CM

## 2022-06-05 NOTE — Therapy (Addendum)
OUTPATIENT PHYSICAL THERAPY SHOULDER TREATMENT/Discharge   Patient Name: Shelby Norton MRN: [id number] DOB:1981/06/21, 41 y.o., female Today's Date: 06/05/2022   PT End of Session - 06/05/22 0917     Visit Number 7    Number of Visits 13    Date for PT Re-Evaluation 07/17/22    PT Start Time 0851   Patient 6 min late   PT Stop Time 0930    PT Time Calculation (min) 39 min    Activity Tolerance Patient tolerated treatment well    Behavior During Therapy [facility] for tasks assessed/performed               Past Medical History:  Diagnosis Date   Allergy    Anemia    Complication of anesthesia    per pt hard to wake   GERD (gastroesophageal reflux disease)    Heart murmur    Hematuria    History of exercise stress test    ETT 03-09-2016 in careeverywhere in epic-- result negative   History of gestational hypertension 07/2017   preeclamptic   History of kidney stones    History of pre-eclampsia    10/ 2018;  2010   History of rhabdomyolysis 12/2013   History of supraventricular tachycardia    per pt hx one episode , last halter montior  05/ 2017 note svt ectopy x4 in careeverywhere from [facility] in [place]   HSV-1 (herpes simplex virus 1) infection    Lactose intolerance    Migraine    Migraines    Mild asthma    prn inhaler   Mitral valve prolapse    per pt dx mvp yrs ago and has been followed cardiologist---  last office visit 05/ 2017 [facility] in [place].  in [place]   MTHFR mutation    Neuromuscular disorder ([place])    Palpitations    Raynauds syndrome    took procardia   Rheumatic fever    Swallowing difficulty    Wears contact lenses    Past Surgical History:  Procedure Laterality Date   ANTERIOR CRUCIATE LIGAMENT REPAIR Right 10/2015   CYSTOSCOPY/URETEROSCOPY/HOLMIUM LASER/STENT PLACEMENT Bilateral 12/24/2017   Procedure: CYSTOSCOPY/RETROGRADE/URETEROSCOPY/HOLMIUM LASER LITHOTRIPSY, STONE BASKETRY /STENT PLACEMENT;  Surgeon: Ceasar Mons, MD;  Location: [facility];  Service: Urology;  Laterality: Bilateral;   HOLMIUM LASER APPLICATION Bilateral 05/27/1682   Procedure: HOLMIUM LASER APPLICATION;  Surgeon: Ceasar Mons, MD;  Location: [facility];  Service: Urology;  Laterality: Bilateral;   KIDNEY STONE SURGERY     NASAL SEPTUM SURGERY  02/2013   NASAL SINUS SURGERY     TOE SOFT TISSUE RELEASE W/ PINNING  2020   5th toe   TONSILLECTOMY  1991   WISDOM TOOTH EXTRACTION     Patient Active Problem List   Diagnosis Date Noted   Dysphagia 07/27/2021   Pain in joint, shoulder region 07/27/2021   Rash and other nonspecific skin eruption 07/27/2021   Sinusitis 07/27/2021   Superior glenoid labrum lesion 07/27/2021   Vitamin B12 deficiency 07/27/2021   Vitamin D deficiency 06/16/2020   Prediabetes 05/05/2020   Stress 05/05/2020   Class 1 obesity with serious comorbidity and body mass index (BMI) of 31.0 to 31.9 in adult 05/05/2020   Attention deficit disorder 05/03/2020   History of nephrolithiasis 04/05/2020   Pelvic pain 12/22/2017   SVD (spontaneous vaginal delivery) / Doren Custard 11/21 09/12/2017   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  Rationale for Evaluation and Treatment Rehabilitation  ONSET DATE: Jan. 2023  SUBJECTIVE:                                                                                                                                                                                       SUBJECTIVE STATEMENT: Pt notes continued improvement. She found the tape to be helpful and has noticed upper trap is less tense. She has been adherent to HEP - says holding weights is reproducing numbness in hand.   PERTINENT HISTORY: -She reports a possible subluxation about 7 years ago -GERD, migraines, heart palpitations, Raynauds  PAIN:  Are you having pain? Yes: NPRS scale: 2/10 - goes up to 5/6 6/26 Pain location: Anterior shoulder Pain description: Ache, numbness/tingling at night into 3-5 digits Aggravating factors: Overhead reaching/lifting Relieving factors: Heat, NSAIDs  PRECAUTIONS: None  WEIGHT BEARING RESTRICTIONS No  FALLS:  Has patient fallen in last 6 months? No  LIVING ENVIRONMENT: Lives with: lives with their family and lives with their spouse Lives in: House/apartment  OCCUPATION: Nurse  PLOF: Independent  PATIENT GOALS  Manage pain better,   OBJECTIVE:     PATIENT SURVEYS:  FOTO [phone] expected in 12 visits COGNITION:           Overall cognitive status: Within functional limits for tasks assessed                                  SENSATION: Pt reports decrease in sensation of the C5 dermatome. All other light touch sensation intact.   POSTURE: Forward head posture, slight rounded shoulders   UPPER EXTREMITY ROM:    Active ROM Right eval Left eval  Shoulder flexion WNL, painful arc WNL  Shoulder extension      Shoulder abduction WNL, painful arc WNL  Shoulder adduction      Shoulder internal rotation WNL WNL  Shoulder external rotation WNL WNL  Elbow flexion WNL WNL  Elbow extension      Wrist flexion      Wrist extension      Wrist ulnar deviation      Wrist radial deviation      Wrist pronation  Wrist supination      (Blank rows = not tested)   Painful arc sign with flexion/abduction at 80deg actively. No pain reported with PROM testing.   UPPER EXTREMITY MMT:    MMT Right eval Left eval Right   Shoulder flexion 20, painful 29 32.1  Shoulder extension       Shoulder abduction 10, painful 26 21.2  Shoulder adduction       Shoulder internal rotation 25.8 28.2   Shoulder external rotation 15, painful 21.5 23.8  Middle trapezius       Lower trapezius       Elbow flexion 33.8, painful 26   Elbow extension       Wrist flexion       Wrist extension       Wrist ulnar deviation       Wrist radial deviation       Wrist pronation       Wrist supination       Grip strength (lbs) 27 28   (Blank rows = not tested)    TODAY'S TREATMENT:  8/14 Trigger Point Dry-Needling  Treatment instructions: Expect mild to moderate muscle soreness. S/S of pneumothorax if dry needled over a lung field, and to seek immediate medical attention should they occur. Patient verbalized understanding of these instructions and education.  Patient Consent Given: Yes Education handout provided: Yes Muscles treated:  upper trap 1 spot in upper trap 3 spots in right t-spine T-6-9  30x50 needle great twitch ellicited  Electrical stimulation performed: No Parameters: N/A Treatment response/outcome: only mild twtich felt with infraspinatus  Manual - STM/ TrP release to R upper trap, teres major/infraspinatus and skilled palpation of trigger points   Mid back strethc 3x20 sec hold  Posterior capsule stretch 3x20 sec hold.      8/2 Trigger Point Dry-Needling  Treatment instructions: Expect mild to moderate muscle soreness. S/S of pneumothorax if dry needled over a lung field, and to seek immediate medical attention should they occur. Patient verbalized understanding of these instructions and education.  Patient Consent Given: Yes Education handout provided: Yes Muscles treated:  sub-scap 3x ; infraspinatus using the scapula as a back drop 2x using a .30x50 needle  Electrical stimulation performed: No Parameters: N/A Treatment response/outcome: only mild twtich felt with  infraspinatus  Manual - STM/ TrP release to R upper trap, teres major/infraspinatus and skilled palpation of trigger points   Supine ABC x1 3lbs has a 5lb weight at home  SL ER 2x10   [place] press 2x10 green     7/18 Trigger Point Dry-Needling  Treatment instructions: Expect mild to moderate muscle soreness. S/S of pneumothorax if dry needled over a lung field, and to seek immediate medical attention should they occur. Patient verbalized understanding of these instructions and education.  Patient Consent Given: Yes Education handout provided: Yes Muscles treated:   Electrical stimulation performed: No Parameters: N/A Treatment response/outcome:  Manual - STM/ TrP release to R upper trap, teres major/infraspinatus Prone T w/bent elbow, W, Y - x15 each with cue for scap retraction/depression  Patient given cover roll.   7/13 Manual - STM/ TrP release to R upper trap, supraspinatus McConnell taping applied to the R upper trap for inhibition  Bil shoulder ER - 2x12, red band Shoulder row - 2x12, green band Shoulder extension - 2x12, green band Bil scaption - x15 1lb   7/11 Trigger Point Dry-Needling  Treatment instructions: Expect mild to moderate muscle soreness. S/S of pneumothorax if dry  needled over a lung field, and to seek immediate medical attention should they occur. Patient verbalized understanding of these instructions and education.  Patient Consent Given: Yes Education handout provided: Yes Muscles treated:   Electrical stimulation performed: No Parameters: N/A Treatment response/outcome:  Manual - STM/ TrP release to R upper trap, suboccipitals Shoulder row - 2x12, red band Shoulder extension - 2x12, red band  Doorway stretch 3x20 sec hold       PATIENT EDUCATION: Education details: Plan of care, home modality use, plan of care Person educated: Patient Education method: Explanation Education comprehension: verbalized understanding   HOME  EXERCISE PROGRAM: Access Code: [email or web address] URL: [email or web address] Date: 04/16/2022 Prepared by: Carolyne Littles  Exercises - Seated Upper Trapezius Stretch  - 1 x daily - 7 x weekly - 3 sets - 10 reps - Gentle Levator Scapulae Stretch  - 1 x daily - 7 x weekly - 3 sets - 10 reps - Standing Bilateral Low Shoulder Row with Anchored Resistance  - 1 x daily - 7 x weekly - 3 sets - 10 reps - Shoulder extension with resistance - Neutral  - 1 x daily - 7 x weekly - 3 sets - 10 reps  ASSESSMENT:  CLINICAL IMPRESSION: Therapy performed a progress note on the patient. Her range and strength have improved significantly. Her FOTO score has improved but she has not yet met her goal. She was having mor mid thoracic lower trap pain.We needled that area and performed soft tissue mobilization to the area. She reports she has been doing more overhead activity which is likely what caused the low trap pain. She is progressing well. She plans on taking about 6 weeks to work on her exercises then following up.   OBJECTIVE IMPAIRMENTS decreased activity tolerance, decreased ROM, decreased strength, increased muscle spasms, and impaired UE functional use.   ACTIVITY LIMITATIONS carrying, lifting, and reach over head  PARTICIPATION LIMITATIONS: cleaning and occupation  PERSONAL FACTORS Age, Fitness, Past/current experiences, and Time since onset of injury/illness/exacerbation are also affecting patient's functional outcome.   REHAB POTENTIAL: Good  CLINICAL DECISION MAKING: Evolving/moderate complexity  EVALUATION COMPLEXITY: Moderate   GOALS: Goals reviewed with patient? Yes  SHORT TERM GOALS: Target date: 7/14  Patient will demonstrate full and pain-free flexion and scaption AROM. Baseline: Goal status: INITIAL  2.  Patient will improve external rotation strength to within 10% of the contralateral side. Baseline:  Goal status: INITIAL  3.  Patient will be able to perform resisted  elbow flexion without pain. Baseline:  Goal status: INITIAL   LONG TERM GOALS: Target date:8/4 Patient will tolerate motions of reaching behind the head and overhead w/lifting without any pain. Baseline:  Goal status: INITIAL  2.  Patient demonstrate flexion and abduction strength to within 10% of the contralateral side. Baseline:  Goal status: INITIAL  3.  Patient will be independent with gym and exercise routine to maintain current level of fitness. Baseline:  Goal status: INITIAL   PLAN: PT FREQUENCY: 1-2x/week  PT DURATION: 6 weeks  PLANNED INTERVENTIONS: Therapeutic exercises, Therapeutic activity, Neuromuscular re-education, Balance training, Gait training, Patient/Family education, Joint mobilization, Aquatic Therapy, Dry Needling, Cryotherapy, Moist heat, and Ionotophoresis 20m/ml Dexamethasone  PLAN FOR NEXT SESSION: STM/DN, rotator cuff isometrics/easy strengthening, postural exercises. Screen ulnar nerve.  PHYSICAL THERAPY DISCHARGE SUMMARY  Visits from Start of Care: 7  Current functional level related to goals / functional outcomes: Improved pain and function    Remaining deficits: Mild pain at times    Education /  Equipment: HEP   Patient agrees to discharge. Patient goals were met. Patient is being discharged due to being pleased with the current functional level.    Carney Living, PT 06/05/2022, 9:20 AM

## 2022-06-07 ENCOUNTER — Encounter (HOSPITAL_BASED_OUTPATIENT_CLINIC_OR_DEPARTMENT_OTHER): Payer: BC Managed Care – PPO | Admitting: Physical Therapy

## 2022-07-10 ENCOUNTER — Other Ambulatory Visit (HOSPITAL_BASED_OUTPATIENT_CLINIC_OR_DEPARTMENT_OTHER): Payer: Self-pay

## 2022-07-10 MED ORDER — NURTEC 75 MG PO TBDP
1.0000 | ORAL_TABLET | Freq: Every day | ORAL | 1 refills | Status: AC
  Filled 2022-07-10: qty 8, 16d supply, fill #0
  Filled 2022-07-17: qty 9, 9d supply, fill #0
  Filled 2022-08-07: qty 8, 16d supply, fill #0
  Filled 2023-01-17 (×2): qty 8, 16d supply, fill #1

## 2022-07-11 ENCOUNTER — Other Ambulatory Visit (HOSPITAL_BASED_OUTPATIENT_CLINIC_OR_DEPARTMENT_OTHER): Payer: Self-pay

## 2022-07-16 ENCOUNTER — Other Ambulatory Visit (HOSPITAL_BASED_OUTPATIENT_CLINIC_OR_DEPARTMENT_OTHER): Payer: Self-pay

## 2022-07-16 ENCOUNTER — Encounter (HOSPITAL_BASED_OUTPATIENT_CLINIC_OR_DEPARTMENT_OTHER): Payer: Self-pay

## 2022-07-17 ENCOUNTER — Encounter (HOSPITAL_BASED_OUTPATIENT_CLINIC_OR_DEPARTMENT_OTHER): Payer: BC Managed Care – PPO | Admitting: Physical Therapy

## 2022-07-17 ENCOUNTER — Other Ambulatory Visit (HOSPITAL_BASED_OUTPATIENT_CLINIC_OR_DEPARTMENT_OTHER): Payer: Self-pay

## 2022-07-17 MED ORDER — PREDNISONE 10 MG PO TABS
ORAL_TABLET | ORAL | 0 refills | Status: AC
  Filled 2022-07-17: qty 21, 6d supply, fill #0

## 2022-07-17 MED ORDER — ALBUTEROL SULFATE (2.5 MG/3ML) 0.083% IN NEBU
INHALATION_SOLUTION | RESPIRATORY_TRACT | 1 refills | Status: AC
  Filled 2022-07-17: qty 75, 7d supply, fill #0
  Filled 2022-07-18: qty 75, 7d supply, fill #1

## 2022-07-18 ENCOUNTER — Other Ambulatory Visit (HOSPITAL_BASED_OUTPATIENT_CLINIC_OR_DEPARTMENT_OTHER): Payer: Self-pay

## 2022-07-25 ENCOUNTER — Other Ambulatory Visit (HOSPITAL_BASED_OUTPATIENT_CLINIC_OR_DEPARTMENT_OTHER): Payer: Self-pay

## 2022-07-25 MED ORDER — AMOXICILLIN 875 MG PO TABS
ORAL_TABLET | ORAL | 0 refills | Status: DC
  Filled 2022-07-25: qty 20, 10d supply, fill #0

## 2022-07-25 MED ORDER — AZITHROMYCIN 250 MG PO TABS
ORAL_TABLET | ORAL | 0 refills | Status: DC
  Filled 2022-07-25: qty 6, 5d supply, fill #0

## 2022-07-26 ENCOUNTER — Other Ambulatory Visit (HOSPITAL_BASED_OUTPATIENT_CLINIC_OR_DEPARTMENT_OTHER): Payer: Self-pay

## 2022-08-07 ENCOUNTER — Other Ambulatory Visit (HOSPITAL_BASED_OUTPATIENT_CLINIC_OR_DEPARTMENT_OTHER): Payer: Self-pay

## 2022-08-07 MED ORDER — VITAMIN D (ERGOCALCIFEROL) 1.25 MG (50000 UNIT) PO CAPS
50000.0000 [IU] | ORAL_CAPSULE | ORAL | 0 refills | Status: AC
  Filled 2022-08-07: qty 4, 28d supply, fill #0

## 2022-08-07 MED ORDER — ADDERALL XR 10 MG PO CP24
10.0000 mg | ORAL_CAPSULE | Freq: Every day | ORAL | 0 refills | Status: AC
  Filled 2022-08-07 – 2023-01-17 (×3): qty 30, 30d supply, fill #0

## 2022-08-09 ENCOUNTER — Other Ambulatory Visit (HOSPITAL_BASED_OUTPATIENT_CLINIC_OR_DEPARTMENT_OTHER): Payer: Self-pay

## 2022-08-13 ENCOUNTER — Other Ambulatory Visit (HOSPITAL_BASED_OUTPATIENT_CLINIC_OR_DEPARTMENT_OTHER): Payer: Self-pay

## 2022-08-17 ENCOUNTER — Other Ambulatory Visit: Payer: Self-pay | Admitting: Registered Nurse

## 2022-08-17 ENCOUNTER — Other Ambulatory Visit (HOSPITAL_BASED_OUTPATIENT_CLINIC_OR_DEPARTMENT_OTHER): Payer: Self-pay

## 2022-08-17 DIAGNOSIS — F411 Generalized anxiety disorder: Secondary | ICD-10-CM

## 2022-08-17 MED ORDER — BUPROPION HCL ER (SR) 150 MG PO TB12: 150.0000 mg | ORAL_TABLET | Freq: Two times a day (BID) | ORAL | 1 refills | Status: AC

## 2022-08-18 ENCOUNTER — Other Ambulatory Visit (HOSPITAL_BASED_OUTPATIENT_CLINIC_OR_DEPARTMENT_OTHER): Payer: Self-pay

## 2022-08-19 ENCOUNTER — Other Ambulatory Visit (HOSPITAL_BASED_OUTPATIENT_CLINIC_OR_DEPARTMENT_OTHER): Payer: Self-pay

## 2022-08-21 ENCOUNTER — Other Ambulatory Visit (HOSPITAL_BASED_OUTPATIENT_CLINIC_OR_DEPARTMENT_OTHER): Payer: Self-pay

## 2022-08-21 MED ORDER — AMPHETAMINE-DEXTROAMPHET ER 10 MG PO CP24
10.0000 mg | ORAL_CAPSULE | Freq: Every day | ORAL | 0 refills | Status: AC
  Filled 2022-08-21 – 2023-01-17 (×2): qty 30, 30d supply, fill #0

## 2022-08-22 ENCOUNTER — Other Ambulatory Visit (HOSPITAL_BASED_OUTPATIENT_CLINIC_OR_DEPARTMENT_OTHER): Payer: Self-pay

## 2022-09-03 ENCOUNTER — Other Ambulatory Visit (HOSPITAL_BASED_OUTPATIENT_CLINIC_OR_DEPARTMENT_OTHER): Payer: Self-pay

## 2022-09-10 ENCOUNTER — Other Ambulatory Visit (HOSPITAL_BASED_OUTPATIENT_CLINIC_OR_DEPARTMENT_OTHER): Payer: Self-pay

## 2022-09-11 ENCOUNTER — Other Ambulatory Visit (HOSPITAL_BASED_OUTPATIENT_CLINIC_OR_DEPARTMENT_OTHER): Payer: Self-pay

## 2022-09-20 ENCOUNTER — Other Ambulatory Visit (HOSPITAL_BASED_OUTPATIENT_CLINIC_OR_DEPARTMENT_OTHER): Payer: Self-pay

## 2022-09-20 MED ORDER — ERGOCALCIFEROL 1.25 MG (50000 UT) PO CAPS
1.0000 | ORAL_CAPSULE | ORAL | 0 refills | Status: DC
  Filled 2022-09-20 – 2022-12-24 (×3): qty 4, 28d supply, fill #0

## 2022-09-20 MED ORDER — NURTEC 75 MG PO TBDP
ORAL_TABLET | ORAL | 1 refills | Status: AC
  Filled 2022-09-20: qty 8, 30d supply, fill #0
  Filled 2022-10-19 – 2023-09-02 (×6): qty 8, 30d supply, fill #1

## 2022-09-20 MED ORDER — AMPHETAMINE-DEXTROAMPHET ER 10 MG PO CP24
10.0000 mg | ORAL_CAPSULE | Freq: Every day | ORAL | 0 refills | Status: AC
  Filled 2022-09-20: qty 30, 30d supply, fill #0

## 2022-09-21 ENCOUNTER — Other Ambulatory Visit (HOSPITAL_BASED_OUTPATIENT_CLINIC_OR_DEPARTMENT_OTHER): Payer: Self-pay

## 2022-09-26 ENCOUNTER — Other Ambulatory Visit (HOSPITAL_BASED_OUTPATIENT_CLINIC_OR_DEPARTMENT_OTHER): Payer: Self-pay

## 2022-09-26 MED ORDER — AZITHROMYCIN 250 MG PO TABS
ORAL_TABLET | ORAL | 0 refills | Status: DC
  Filled 2022-09-26: qty 6, 5d supply, fill #0

## 2022-09-29 ENCOUNTER — Other Ambulatory Visit (HOSPITAL_BASED_OUTPATIENT_CLINIC_OR_DEPARTMENT_OTHER): Payer: Self-pay

## 2022-10-19 ENCOUNTER — Other Ambulatory Visit (HOSPITAL_BASED_OUTPATIENT_CLINIC_OR_DEPARTMENT_OTHER): Payer: Self-pay

## 2022-10-20 ENCOUNTER — Other Ambulatory Visit (HOSPITAL_BASED_OUTPATIENT_CLINIC_OR_DEPARTMENT_OTHER): Payer: Self-pay

## 2022-10-25 ENCOUNTER — Other Ambulatory Visit (HOSPITAL_BASED_OUTPATIENT_CLINIC_OR_DEPARTMENT_OTHER): Payer: Self-pay

## 2022-10-30 ENCOUNTER — Other Ambulatory Visit (HOSPITAL_BASED_OUTPATIENT_CLINIC_OR_DEPARTMENT_OTHER): Payer: Self-pay

## 2022-11-02 ENCOUNTER — Other Ambulatory Visit (HOSPITAL_BASED_OUTPATIENT_CLINIC_OR_DEPARTMENT_OTHER): Payer: Self-pay

## 2022-11-07 ENCOUNTER — Other Ambulatory Visit (HOSPITAL_BASED_OUTPATIENT_CLINIC_OR_DEPARTMENT_OTHER): Payer: Self-pay

## 2022-11-14 ENCOUNTER — Other Ambulatory Visit (HOSPITAL_BASED_OUTPATIENT_CLINIC_OR_DEPARTMENT_OTHER): Payer: Self-pay

## 2022-11-14 MED ORDER — ALBUTEROL SULFATE HFA 108 (90 BASE) MCG/ACT IN AERS
INHALATION_SPRAY | RESPIRATORY_TRACT | 0 refills | Status: DC
  Filled 2022-11-14: qty 6.7, 25d supply, fill #0

## 2022-11-14 MED ORDER — OSELTAMIVIR PHOSPHATE 75 MG PO CAPS
75.0000 mg | ORAL_CAPSULE | Freq: Two times a day (BID) | ORAL | 0 refills | Status: AC
  Filled 2022-11-14: qty 10, 5d supply, fill #0

## 2022-11-21 ENCOUNTER — Other Ambulatory Visit (HOSPITAL_BASED_OUTPATIENT_CLINIC_OR_DEPARTMENT_OTHER): Payer: Self-pay

## 2022-11-21 MED ORDER — AZITHROMYCIN 250 MG PO TABS
ORAL_TABLET | ORAL | 0 refills | Status: AC
  Filled 2022-11-21: qty 6, 5d supply, fill #0

## 2022-11-22 ENCOUNTER — Other Ambulatory Visit (HOSPITAL_BASED_OUTPATIENT_CLINIC_OR_DEPARTMENT_OTHER): Payer: Self-pay

## 2022-12-03 ENCOUNTER — Other Ambulatory Visit (HOSPITAL_BASED_OUTPATIENT_CLINIC_OR_DEPARTMENT_OTHER): Payer: Self-pay

## 2022-12-07 ENCOUNTER — Other Ambulatory Visit: Payer: Self-pay | Admitting: Registered Nurse

## 2022-12-07 ENCOUNTER — Other Ambulatory Visit (HOSPITAL_BASED_OUTPATIENT_CLINIC_OR_DEPARTMENT_OTHER): Payer: Self-pay

## 2022-12-10 ENCOUNTER — Other Ambulatory Visit (HOSPITAL_BASED_OUTPATIENT_CLINIC_OR_DEPARTMENT_OTHER): Payer: Self-pay

## 2022-12-10 MED ORDER — ALBUTEROL SULFATE HFA 108 (90 BASE) MCG/ACT IN AERS
2.0000 | INHALATION_SPRAY | Freq: Four times a day (QID) | RESPIRATORY_TRACT | 0 refills | Status: DC
  Filled 2022-12-10: qty 6.7, 17d supply, fill #0
  Filled 2023-06-02: qty 6.7, 25d supply, fill #0

## 2022-12-17 ENCOUNTER — Other Ambulatory Visit (HOSPITAL_BASED_OUTPATIENT_CLINIC_OR_DEPARTMENT_OTHER): Payer: Self-pay

## 2022-12-24 ENCOUNTER — Other Ambulatory Visit (HOSPITAL_BASED_OUTPATIENT_CLINIC_OR_DEPARTMENT_OTHER): Payer: Self-pay

## 2022-12-24 MED ORDER — ADDERALL XR 10 MG PO CP24
10.0000 mg | ORAL_CAPSULE | Freq: Every morning | ORAL | 0 refills | Status: AC
  Filled 2023-06-02 – 2023-06-05 (×2): qty 30, 30d supply, fill #0

## 2022-12-26 ENCOUNTER — Other Ambulatory Visit (HOSPITAL_BASED_OUTPATIENT_CLINIC_OR_DEPARTMENT_OTHER): Payer: Self-pay

## 2022-12-31 ENCOUNTER — Other Ambulatory Visit (HOSPITAL_BASED_OUTPATIENT_CLINIC_OR_DEPARTMENT_OTHER): Payer: Self-pay

## 2023-01-02 ENCOUNTER — Other Ambulatory Visit (HOSPITAL_BASED_OUTPATIENT_CLINIC_OR_DEPARTMENT_OTHER): Payer: Self-pay

## 2023-01-02 MED ORDER — NURTEC 75 MG PO TBDP
75.0000 mg | ORAL_TABLET | ORAL | 1 refills | Status: AC
  Filled 2023-01-02: qty 9, 30d supply, fill #0
  Filled 2023-01-18 – 2023-01-19 (×2): qty 9, fill #0
  Filled 2023-04-04: qty 9, 15d supply, fill #0

## 2023-01-04 ENCOUNTER — Other Ambulatory Visit (HOSPITAL_BASED_OUTPATIENT_CLINIC_OR_DEPARTMENT_OTHER): Payer: Self-pay

## 2023-01-08 ENCOUNTER — Other Ambulatory Visit: Payer: Self-pay

## 2023-01-17 ENCOUNTER — Other Ambulatory Visit (HOSPITAL_BASED_OUTPATIENT_CLINIC_OR_DEPARTMENT_OTHER): Payer: Self-pay

## 2023-01-17 MED ORDER — ERGOCALCIFEROL 1.25 MG (50000 UT) PO CAPS
1.0000 | ORAL_CAPSULE | ORAL | 0 refills | Status: DC
  Filled 2023-01-17 – 2023-06-02 (×2): qty 4, 28d supply, fill #0

## 2023-01-18 ENCOUNTER — Other Ambulatory Visit (HOSPITAL_BASED_OUTPATIENT_CLINIC_OR_DEPARTMENT_OTHER): Payer: Self-pay

## 2023-01-19 ENCOUNTER — Other Ambulatory Visit (HOSPITAL_BASED_OUTPATIENT_CLINIC_OR_DEPARTMENT_OTHER): Payer: Self-pay

## 2023-01-24 ENCOUNTER — Other Ambulatory Visit (HOSPITAL_BASED_OUTPATIENT_CLINIC_OR_DEPARTMENT_OTHER): Payer: Self-pay

## 2023-02-26 ENCOUNTER — Other Ambulatory Visit: Payer: Self-pay | Admitting: Registered Nurse

## 2023-02-26 DIAGNOSIS — F410 Panic disorder [episodic paroxysmal anxiety] without agoraphobia: Secondary | ICD-10-CM

## 2023-02-27 ENCOUNTER — Encounter (HOSPITAL_BASED_OUTPATIENT_CLINIC_OR_DEPARTMENT_OTHER): Payer: Self-pay | Admitting: Pharmacist

## 2023-02-27 ENCOUNTER — Other Ambulatory Visit (HOSPITAL_BASED_OUTPATIENT_CLINIC_OR_DEPARTMENT_OTHER): Payer: Self-pay

## 2023-02-27 NOTE — Telephone Encounter (Signed)
Pt has est.care at another office and she does not need to have this filled by [place]

## 2023-02-28 ENCOUNTER — Other Ambulatory Visit (HOSPITAL_BASED_OUTPATIENT_CLINIC_OR_DEPARTMENT_OTHER): Payer: Self-pay

## 2023-02-28 MED ORDER — AMPHETAMINE-DEXTROAMPHET ER 10 MG PO CP24
10.0000 mg | ORAL_CAPSULE | Freq: Every morning | ORAL | 0 refills | Status: DC
  Filled 2023-02-28: qty 30, 30d supply, fill #0

## 2023-02-28 MED ORDER — VITAMIN D (ERGOCALCIFEROL) 1.25 MG (50000 UNIT) PO CAPS
50000.0000 [IU] | ORAL_CAPSULE | ORAL | 0 refills | Status: DC
  Filled 2023-02-28: qty 4, 28d supply, fill #0

## 2023-02-28 MED ORDER — CLONAZEPAM 0.5 MG PO TBDP
0.5000 mg | ORAL_TABLET | Freq: Two times a day (BID) | ORAL | 0 refills | Status: DC | PRN
  Filled 2023-02-28: qty 6, 3d supply, fill #0

## 2023-04-04 ENCOUNTER — Encounter (HOSPITAL_BASED_OUTPATIENT_CLINIC_OR_DEPARTMENT_OTHER): Payer: Self-pay | Admitting: Pharmacist

## 2023-04-04 ENCOUNTER — Other Ambulatory Visit: Payer: Self-pay | Admitting: Registered Nurse

## 2023-04-04 ENCOUNTER — Other Ambulatory Visit (HOSPITAL_BASED_OUTPATIENT_CLINIC_OR_DEPARTMENT_OTHER): Payer: Self-pay

## 2023-04-04 DIAGNOSIS — G43809 Other migraine, not intractable, without status migrainosus: Secondary | ICD-10-CM

## 2023-04-04 MED ORDER — ADDERALL XR 10 MG PO CP24
10.0000 mg | ORAL_CAPSULE | Freq: Every morning | ORAL | 0 refills | Status: DC
  Filled 2023-04-04: qty 30, 30d supply, fill #0

## 2023-04-04 MED ORDER — VITAMIN D (ERGOCALCIFEROL) 1.25 MG (50000 UNIT) PO CAPS
50000.0000 [IU] | ORAL_CAPSULE | ORAL | 0 refills | Status: DC
  Filled 2023-04-04: qty 4, 28d supply, fill #0

## 2023-04-05 ENCOUNTER — Other Ambulatory Visit: Payer: Self-pay

## 2023-04-11 ENCOUNTER — Other Ambulatory Visit (HOSPITAL_BASED_OUTPATIENT_CLINIC_OR_DEPARTMENT_OTHER): Payer: Self-pay

## 2023-04-15 ENCOUNTER — Other Ambulatory Visit (HOSPITAL_BASED_OUTPATIENT_CLINIC_OR_DEPARTMENT_OTHER): Payer: Self-pay

## 2023-04-22 ENCOUNTER — Other Ambulatory Visit: Payer: Self-pay

## 2023-04-22 ENCOUNTER — Other Ambulatory Visit (HOSPITAL_BASED_OUTPATIENT_CLINIC_OR_DEPARTMENT_OTHER): Payer: Self-pay

## 2023-04-22 MED ORDER — NURTEC 75 MG PO TBDP
75.0000 mg | ORAL_TABLET | ORAL | 2 refills | Status: DC
  Filled 2023-04-22: qty 8, 30d supply, fill #0
  Filled 2023-05-24 – 2023-06-25 (×3): qty 15, 30d supply, fill #0

## 2023-04-22 MED ORDER — LOSARTAN POTASSIUM 25 MG PO TABS
25.0000 mg | ORAL_TABLET | Freq: Every day | ORAL | 3 refills | Status: DC
  Filled 2023-04-22: qty 90, 90d supply, fill #0
  Filled 2023-06-02 – 2023-07-16 (×3): qty 90, 90d supply, fill #1
  Filled 2023-09-02 – 2023-10-14 (×2): qty 90, 90d supply, fill #2
  Filled 2023-11-22: qty 90, 90d supply, fill #3

## 2023-04-22 MED ORDER — LISDEXAMFETAMINE DIMESYLATE 20 MG PO CAPS
20.0000 mg | ORAL_CAPSULE | Freq: Every morning | ORAL | 0 refills | Status: DC
  Filled 2023-04-22: qty 30, 30d supply, fill #0

## 2023-04-23 ENCOUNTER — Other Ambulatory Visit (HOSPITAL_BASED_OUTPATIENT_CLINIC_OR_DEPARTMENT_OTHER): Payer: Self-pay

## 2023-05-18 ENCOUNTER — Other Ambulatory Visit (HOSPITAL_BASED_OUTPATIENT_CLINIC_OR_DEPARTMENT_OTHER): Payer: Self-pay

## 2023-05-23 ENCOUNTER — Other Ambulatory Visit (HOSPITAL_BASED_OUTPATIENT_CLINIC_OR_DEPARTMENT_OTHER): Payer: Self-pay

## 2023-05-24 ENCOUNTER — Other Ambulatory Visit: Payer: Self-pay

## 2023-05-24 ENCOUNTER — Other Ambulatory Visit (HOSPITAL_BASED_OUTPATIENT_CLINIC_OR_DEPARTMENT_OTHER): Payer: Self-pay

## 2023-05-25 ENCOUNTER — Other Ambulatory Visit (HOSPITAL_BASED_OUTPATIENT_CLINIC_OR_DEPARTMENT_OTHER): Payer: Self-pay

## 2023-06-02 ENCOUNTER — Other Ambulatory Visit (HOSPITAL_BASED_OUTPATIENT_CLINIC_OR_DEPARTMENT_OTHER): Payer: Self-pay

## 2023-06-02 ENCOUNTER — Other Ambulatory Visit: Payer: Self-pay

## 2023-06-03 ENCOUNTER — Other Ambulatory Visit (HOSPITAL_BASED_OUTPATIENT_CLINIC_OR_DEPARTMENT_OTHER): Payer: Self-pay

## 2023-06-04 ENCOUNTER — Other Ambulatory Visit (HOSPITAL_BASED_OUTPATIENT_CLINIC_OR_DEPARTMENT_OTHER): Payer: Self-pay

## 2023-06-05 ENCOUNTER — Other Ambulatory Visit (HOSPITAL_BASED_OUTPATIENT_CLINIC_OR_DEPARTMENT_OTHER): Payer: Self-pay

## 2023-06-11 ENCOUNTER — Other Ambulatory Visit (HOSPITAL_BASED_OUTPATIENT_CLINIC_OR_DEPARTMENT_OTHER): Payer: Self-pay

## 2023-06-11 MED ORDER — ATOMOXETINE HCL 18 MG PO CAPS
18.0000 mg | ORAL_CAPSULE | Freq: Every day | ORAL | 1 refills | Status: DC
  Filled 2023-06-11: qty 30, 30d supply, fill #0
  Filled 2023-07-04: qty 30, 30d supply, fill #1

## 2023-06-17 ENCOUNTER — Emergency Department (HOSPITAL_BASED_OUTPATIENT_CLINIC_OR_DEPARTMENT_OTHER): Payer: BC Managed Care – PPO | Admitting: Radiology

## 2023-06-17 ENCOUNTER — Emergency Department (HOSPITAL_BASED_OUTPATIENT_CLINIC_OR_DEPARTMENT_OTHER): Payer: BC Managed Care – PPO

## 2023-06-17 ENCOUNTER — Other Ambulatory Visit: Payer: Self-pay

## 2023-06-17 ENCOUNTER — Emergency Department (HOSPITAL_BASED_OUTPATIENT_CLINIC_OR_DEPARTMENT_OTHER)
Admission: EM | Admit: 2023-06-17 | Discharge: 2023-06-17 | Disposition: A | Discharge status: Home / Self Care | Payer: BC Managed Care – PPO | Attending: Emergency Medicine | Admitting: Emergency Medicine

## 2023-06-17 DIAGNOSIS — M542 Cervicalgia: Secondary | ICD-10-CM | POA: Diagnosis not present

## 2023-06-17 DIAGNOSIS — R200 Anesthesia of skin: Secondary | ICD-10-CM | POA: Diagnosis not present

## 2023-06-17 LAB — PREGNANCY, URINE: Preg Test, Ur: NEGATIVE

## 2023-06-17 MED ORDER — NAPROXEN 250 MG PO TABS
250.0000 mg | ORAL_TABLET | Freq: Once | ORAL | Status: AC
  Administered 2023-06-17: 250 mg via ORAL
  Filled 2023-06-17: qty 1

## 2023-06-17 MED ORDER — LIDOCAINE 5 % EX PTCH
1.0000 | MEDICATED_PATCH | CUTANEOUS | Status: DC
  Administered 2023-06-17: 1 via TRANSDERMAL
  Filled 2023-06-17: qty 1

## 2023-06-17 MED ORDER — METHOCARBAMOL 500 MG PO TABS
500.0000 mg | ORAL_TABLET | Freq: Once | ORAL | Status: AC
  Administered 2023-06-17: 500 mg via ORAL
  Filled 2023-06-17: qty 1

## 2023-06-17 MED ORDER — METHOCARBAMOL 500 MG PO TABS: 500.0000 mg | ORAL_TABLET | Freq: Three times a day (TID) | ORAL | 0 refills | Status: AC | PRN

## 2023-06-17 MED ORDER — ACETAMINOPHEN 500 MG PO TABS
1000.0000 mg | ORAL_TABLET | Freq: Once | ORAL | Status: AC
  Administered 2023-06-17: 1000 mg via ORAL
  Filled 2023-06-17: qty 2

## 2023-06-17 MED ORDER — DEXAMETHASONE 4 MG PO TABS
10.0000 mg | ORAL_TABLET | Freq: Once | ORAL | Status: AC
  Administered 2023-06-17: 10 mg via ORAL
  Filled 2023-06-17: qty 3

## 2023-06-17 NOTE — ED Triage Notes (Signed)
Pt via pov from home with neck pain and numbness to her left arm that began suddenly. She reports that the pain and numbness are increasing, and she feels flushed. Pt states this began around 3pm, LKW [id number]. Denies any difficulty speaking, vision or balance changes. She states the only other symptom is that it hurts to turn her head. Pt alert & oriented, nad noted.

## 2023-06-17 NOTE — Discharge Instructions (Signed)
You were seen today for neck pain.  You may have cervical radiculopathy.  You can take the prescribed muscle relaxer as well as Tylenol, Aleve at home.  You can also use over-the-counter lidocaine patches.  I recommend you call your doctor schedule close follow-up, if your symptoms persist you should call the neurosurgical office at the number above.  If you develop weakness, severe pain, worsening numbness, difficulty going to bathroom or any other new concerning symptoms you should return to the ED.

## 2023-06-17 NOTE — ED Provider Notes (Signed)
[place] EMERGENCY DEPARTMENT AT [facility] Provider Note   CSN: [id number] Arrival date & time: 06/17/23  1525     History  Chief Complaint  Patient presents with   Neck Pain   Numbness    Shelby Norton is a 42 y.o. female.   Neck Pain 42 year old female history of migraines, mitral valve prolapse, Raynaud's presenting for left-sided neck pain and left arm numbness.  Patient reports she was at home working today.  She was moving around and at 1 point she stood up and then developed sudden onset left-sided neck pain that radiates to the left shoulder.  She has associated numbness in her left pinky finger.  No weakness.  She is difficulty moving her neck due to pain, no pain shooting down her neck and no midline pain.  No saddle anesthesia, bowel or bladder incontinence.  No history of cervical spine injury or surgery.  She is otherwise been at her baseline health.  She has no chest pain, shortness of breath.  No falls.  She has multiple allergies, she is reacted to betamethasone before while pregnant but has tolerated other steroids without difficulty.  She also has an allergy to aspirin but is tolerated naproxen.  She has had no headache or vision changes or other neurologic changes.     Home Medications Prior to Admission medications   Medication Sig Start Date End Date Taking? Authorizing Provider  methocarbamol (ROBAXIN) 500 MG tablet Take 1 tablet (500 mg total) by mouth 3 (three) times daily as needed for muscle spasms. 06/17/23  Yes Laurence Spates, MD  ADDERALL XR 10 MG 24 hr capsule Take 1 capsule (10 mg total) by mouth daily. 08/07/22     ADDERALL XR 10 MG 24 hr capsule Take 1 capsule (10 mg total) by mouth every morning. 01/20/23     albuterol (PROVENTIL HFA;VENTOLIN HFA) 108 (90 Base) MCG/ACT inhaler Inhale 1-2 puffs into the lungs every 6 (six) hours as needed for wheezing or shortness of breath.    [provider]  albuterol (PROVENTIL) (2.5 MG/3ML)  0.083% nebulizer solution Inhale 3 mLs (2.5 mg total) by nebulization every 6 (six) hours as needed for Wheezing. 07/17/22     albuterol (VENTOLIN HFA) 108 (90 Base) MCG/ACT inhaler Inhale 2 puffs into the lungs 4 (four) times daily as needed for cough, chest tightness or wheezing 12/10/22     amphetamine-dextroamphetamine (ADDERALL XR) 10 MG 24 hr capsule Take 1 capsule (10 mg total) by mouth daily. Patient not taking: No sig reported 09/11/21   Janeece Agee, NP  amphetamine-dextroamphetamine (ADDERALL XR) 10 MG 24 hr capsule Take 1 capsule (10 mg total) by mouth daily. Patient not taking: Reported on 04/13/2022 11/15/21   Janeece Agee, NP  amphetamine-dextroamphetamine (ADDERALL XR) 10 MG 24 hr capsule Take 1 capsule (10 mg total) by mouth daily. 03/13/22   Janeece Agee, NP  amphetamine-dextroamphetamine (ADDERALL XR) 10 MG 24 hr capsule Take 1 capsule (10 mg total) by mouth daily. 08/21/22     amphetamine-dextroamphetamine (ADDERALL XR) 10 MG 24 hr capsule Take 1 capsule (10 mg total) by mouth daily. 09/19/22     atomoxetine (STRATTERA) 18 MG capsule Take 1 capsule (18 mg total) by mouth daily. Swallow capsule whole; do not open. If opened accidentally, do not touch eyes; wash hands immediately (product is an eye irritant). 06/11/23     azithromycin (ZITHROMAX) 250 MG tablet Take two tablets on the first day and then one tablet every day after. Patient  not taking: Reported on 04/13/2022 12/19/21     azithromycin (ZITHROMAX) 250 MG tablet Take two tablets on the first day and then one tablet every day after. 11/21/22     benzonatate (TESSALON) 100 MG capsule Take 1 capsule (100 mg total) by mouth 3 (three) times daily as needed for up to 10 days. Patient not taking: Reported on 04/13/2022 10/12/21     benzonatate (TESSALON) 200 MG capsule Take 1 capsule (200 mg total) by mouth 3 (three)  times daily as needed for up to 7 days for Cough. Patient not taking: Reported on 04/13/2022 12/19/21      buPROPion (WELLBUTRIN SR) 150 MG 12 hr tablet Take 1 tablet (150 mg total) by mouth 2 (two) times daily. 08/17/22   Sheliah Hatch, MD  clonazePAM (KLONOPIN) 0.5 MG disintegrating tablet Take 1 tablet (0.5 mg total) by mouth 2 (two) times daily. 10/04/21   Janeece Agee, NP  clonazePAM (KLONOPIN) 0.5 MG disintegrating tablet Dissolve 1 tablet (0.5 mg total) on tongue 2 (two) times a day as needed (for flights). 02/27/23     EPINEPHrine 0.3 mg/0.3 mL IJ SOAJ injection Inject 0.3 mg into the skin daily as needed. For allergic reaction Patient not taking: Reported on 04/13/2022 06/22/14   [provider]  ergocalciferol (VITAMIN D2) 1.25 MG (50000 UT) capsule Take 1 capsule (50,000 Units total) by mouth once a week. 01/17/23     lisdexamfetamine (VYVANSE) 20 MG capsule Take 1 capsule (20 mg total) by mouth every morning. 04/22/23     losartan (COZAAR) 25 MG tablet Take 1 tablet (25 mg total) by mouth daily. 04/22/23     methylPREDNISolone (MEDROL DOSEPAK) 4 MG TBPK tablet Follow package directions Patient not taking: Reported on 04/13/2022 10/12/21     oseltamivir (TAMIFLU) 75 MG capsule Take 1 capsule (75 mg total) by mouth 2 (two) times daily for 5 days. 11/14/22     predniSONE (DELTASONE) 10 MG tablet Take as directed on dose pack for 6 days. See attached sheet. Patient not taking: Reported on 04/13/2022 12/19/21     predniSONE (DELTASONE) 10 MG tablet Take as directed for 6 days. 07/17/22     Probiotic Product (PROBIOTIC PO) Take 2 capsules by mouth daily. Patient not taking: Reported on 04/13/2022    [provider]  Rimegepant Sulfate (NURTEC) 75 MG TBDP Dissolve 1 tablet (75 mg total) by mouth once as needed for Migraine (no more than one in 24 hours). 07/10/22     Rimegepant Sulfate (NURTEC) 75 MG TBDP Dissolve 1 tablet (75 mg total) by mouth once as needed for up to 1 dose for Migraine (no more than one in 24 hours). 09/19/22     Rimegepant Sulfate (NURTEC) 75 MG TBDP Dissolve 1  tablet (75 mg) on tongue once daily as needed for migraine for up to 1 dose. 12/24/22     Rimegepant Sulfate (NURTEC) 75 MG TBDP Take 1 tablet (75 mg total) by mouth every other day. 04/22/23     topiramate (TOPAMAX) 25 MG tablet Take 1 tablet (25 mg total) by mouth daily. 11/15/21   Janeece Agee, NP  vitamin C (ASCORBIC ACID) 500 MG tablet Take 500 mg by mouth daily. Patient not taking: Reported on 04/13/2022    [provider]  Vitamin D, Ergocalciferol, (DRISDOL) 1.25 MG (50000 UNIT) CAPS capsule Take 1 capsule (50,000 Units total) by mouth every 7 (seven) days. 05/29/22   Eulis Foster, FNP  Vitamin D, Ergocalciferol, (DRISDOL) 1.25 MG (50000  UNIT) CAPS capsule Take 1 capsule (50,000 Units total) by mouth once a week. 08/07/22   Iona Hansen, NP  Vitamin D, Ergocalciferol, (DRISDOL) 1.25 MG (50000 UNIT) CAPS capsule Take 1 capsule (50,000 Units total) by mouth once a week. 04/04/23     zinc gluconate 50 MG tablet Take 50 mg by mouth daily.    [provider]  FLUoxetine (PROZAC) 20 MG capsule Take 1 capsule (20 mg total) by mouth daily. 10/04/21 12/19/21  Janeece Agee, NP      Allergies    Aspirin, Other, Codeine, Hydrocodone, Sulfamethoxazole, and Betamethasone    Review of Systems   Review of Systems  Musculoskeletal:  Positive for neck pain.  Review of systems completed and notable as per HPI.  ROS otherwise negative.   Physical Exam Updated Vital Signs BP (!) 149/95   Pulse 76   Temp 98.3 F (36.8 C) (Oral)   Resp 16   Ht 5\' 5"  (1.651 m)   Wt 83.5 kg   LMP 05/23/2023 (Approximate)   SpO2 100%   BMI 30.62 kg/m  Physical Exam Vitals and nursing note reviewed.  Constitutional:      General: She is not in acute distress.    Appearance: She is well-developed.  HENT:     Head: Normocephalic and atraumatic.     Nose: Nose normal.     Mouth/Throat:     Mouth: Mucous membranes are moist.     Pharynx: Oropharynx is clear.  Eyes:     Extraocular Movements:  Extraocular movements intact.     Conjunctiva/sclera: Conjunctivae normal.     Pupils: Pupils are equal, round, and reactive to light.  Neck:     Vascular: No carotid bruit.     Comments: No midline spinal tenderness.  She is somewhat limited range of motion with turning her head to the left due to pain.  He has mild tenderness over the left paraspinal muscles and trapezius.  She has full range of motion of the shoulder, elbow, wrist, fingers.  She is full strength at all these joints.  She is normal sensation in median, ulnar, radial nerve distributions.  She has full strength with finger abduction and adduction as well as flexion and extension of the fingers and the wrist.  No skin changes.  He has normal capillary refill, 2+ radial pulse. Cardiovascular:     Rate and Rhythm: Normal rate and regular rhythm.     Heart sounds: No murmur heard. Pulmonary:     Effort: Pulmonary effort is normal. No respiratory distress.     Breath sounds: Normal breath sounds.  Abdominal:     Palpations: Abdomen is soft.     Tenderness: There is no abdominal tenderness.  Musculoskeletal:        General: No swelling.  Lymphadenopathy:     Cervical: No cervical adenopathy.  Skin:    General: Skin is warm and dry.     Capillary Refill: Capillary refill takes less than 2 seconds.  Neurological:     General: No focal deficit present.     Mental Status: She is alert and oriented to person, place, and time. Mental status is at baseline.     Cranial Nerves: No cranial nerve deficit.     Sensory: No sensory deficit.     Motor: No weakness.  Psychiatric:        Mood and Affect: Mood normal.     ED Results / Procedures / Treatments   Labs (all labs ordered  are listed, but only abnormal results are displayed) Labs Reviewed  PREGNANCY, URINE    EKG EKG Interpretation Date/Time:  Monday June 17 2023 15:32:39 EDT Ventricular Rate:  95 PR Interval:  152 QRS Duration:  70 QT Interval:  348 QTC  Calculation: 437 R Axis:   19  Text Interpretation: Normal sinus rhythm Cannot rule out Anterior infarct , age undetermined Abnormal ECG When compared with ECG of 05-Jun-2020 04:14, No significant change was found Confirmed by Fulton Reek [phone]) on 06/17/2023 4:42:13 PM  Radiology CT Cervical Spine Wo Contrast  Result Date: 06/17/2023 CLINICAL DATA:  Cervical radiculopathy. Neck pain with left arm numbness. EXAM: CT CERVICAL SPINE WITHOUT CONTRAST TECHNIQUE: Multidetector CT imaging of the cervical spine was performed without intravenous contrast. Multiplanar CT image reconstructions were also generated. RADIATION DOSE REDUCTION: This exam was performed according to the departmental dose-optimization program which includes automated exposure control, adjustment of the mA and/or kV according to patient size and/or use of iterative reconstruction technique. COMPARISON:  None Available. FINDINGS: Alignment: No static subluxation. Facets are aligned. Occipital condyles and the lateral masses of C1 and C2 are normally approximated. Skull base and vertebrae: No acute fracture. Soft tissues and spinal canal: No prevertebral fluid or swelling. No visible canal hematoma. Disc levels: No visible disc herniation. No spinal canal or neural foraminal stenosis. Upper chest: No pneumothorax, pulmonary nodule or pleural effusion. Other: Normal visualized paraspinal cervical soft tissues. IMPRESSION: 1. No acute fracture or static subluxation of the cervical spine. 2. No spinal canal or neural foraminal stenosis. Electronically Signed   By: Deatra Robinson M.D.   On: 06/17/2023 18:40   DG Shoulder Left  Result Date: 06/17/2023 CLINICAL DATA:  Neck pain/numbness EXAM: LEFT SHOULDER - 2+ VIEW COMPARISON:  None Available. FINDINGS: No fracture or dislocation is seen. The joint spaces are preserved. Visualized soft tissues are within normal limits. Visualized left lung is clear. IMPRESSION: Negative. Electronically Signed    By: Charline Bills M.D.   On: 06/17/2023 16:51    Procedures Procedures    Medications Ordered in ED Medications  lidocaine (LIDODERM) 5 % 1 patch (1 patch Transdermal Patch Applied 06/17/23 1846)  dexamethasone (DECADRON) tablet 10 mg (10 mg Oral Given 06/17/23 1720)  methocarbamol (ROBAXIN) tablet 500 mg (500 mg Oral Given 06/17/23 1719)  naproxen (NAPROSYN) tablet 250 mg (250 mg Oral Given 06/17/23 1719)  acetaminophen (TYLENOL) tablet 1,000 mg (1,000 mg Oral Given 06/17/23 1845)    ED Course/ Medical Decision Making/ A&P                                 Medical Decision Making Amount and/or Complexity of Data Reviewed Labs: ordered. Radiology: ordered.  Risk OTC drugs. Prescription drug management.   Medical Decision Making:   Colin ROSELEA POZZA is a 42 y.o. female who presented to the ED today with left-sided neck pain and left arm numbness.  Vital signs reviewed.  On exam she has some paresthesias in her left finger but objectively normal neurologic exam without weakness or numbness.  This was sudden onset after moving her head, no acute trauma but is concerning for possible radiculopathy.  X-ray of the shoulder was obtained and first look which was unremarkable.  EKG unremarkable as well, no chest pain I do not think this is an anginal equivalent.  No risk factors or signs of infection.  Low suspicion for vascular cause based on exam.  I reviewed her allergies with her and ordered some medications to help with her symptoms.  Patient placed on continuous vitals and telemetry monitoring while in ED which was reviewed periodically.  Reviewed and confirmed nursing documentation for past medical history, family history, social history.  Reassessment and Plan:   CT scan reviewed, no signs of stenosis, fracture or other acute abnormality.  On reassessment, symptoms are significantly improved.  Her range of motion is improved as well as her pain.  She has no numbness or weakness, no lower  extremity symptoms either.  She is ambulating without difficulty.  I am concerned for possible pinched nerve versus cervical radiculopathy.  Given she has normal neurologic exam I do not think she needs emergent MRI or admission.  I gave her prescription for Robaxin and recommend she follow-up with her PCP, also gave her referral for spine surgery in case symptoms persist.  Strict return precautions given.  Discharged in stable condition.   Patient's presentation is most consistent with acute complicated illness / injury requiring diagnostic workup.           Final Clinical Impression(s) / ED Diagnoses Final diagnoses:  Neck pain    Rx / DC Orders ED Discharge Orders          Ordered    methocarbamol (ROBAXIN) 500 MG tablet  3 times daily PRN        06/17/23 1942              Laurence Spates, MD 06/17/23 2352

## 2023-06-24 ENCOUNTER — Other Ambulatory Visit (HOSPITAL_BASED_OUTPATIENT_CLINIC_OR_DEPARTMENT_OTHER): Payer: Self-pay

## 2023-06-24 MED ORDER — ALBUTEROL SULFATE HFA 108 (90 BASE) MCG/ACT IN AERS
2.0000 | INHALATION_SPRAY | Freq: Four times a day (QID) | RESPIRATORY_TRACT | 0 refills | Status: DC
  Filled 2023-09-02 – 2023-09-06 (×2): qty 6.7, 25d supply, fill #0

## 2023-06-25 ENCOUNTER — Other Ambulatory Visit (HOSPITAL_BASED_OUTPATIENT_CLINIC_OR_DEPARTMENT_OTHER): Payer: Self-pay

## 2023-06-25 ENCOUNTER — Encounter (HOSPITAL_BASED_OUTPATIENT_CLINIC_OR_DEPARTMENT_OTHER): Payer: Self-pay

## 2023-06-25 MED ORDER — VITAMIN D (ERGOCALCIFEROL) 1.25 MG (50000 UNIT) PO CAPS
50000.0000 [IU] | ORAL_CAPSULE | ORAL | 0 refills | Status: AC
  Filled 2023-06-25: qty 4, 28d supply, fill #0

## 2023-06-26 ENCOUNTER — Other Ambulatory Visit (HOSPITAL_BASED_OUTPATIENT_CLINIC_OR_DEPARTMENT_OTHER): Payer: Self-pay

## 2023-06-26 IMAGING — DX DG SHOULDER 2+V*R*
3 series · 3 of 3 positions shown · non-contrast
Comparison: None Available.

CLINICAL DATA: Pain.

EXAM:
RIGHT SHOULDER - 2+ VIEW

[shoulder grashey]
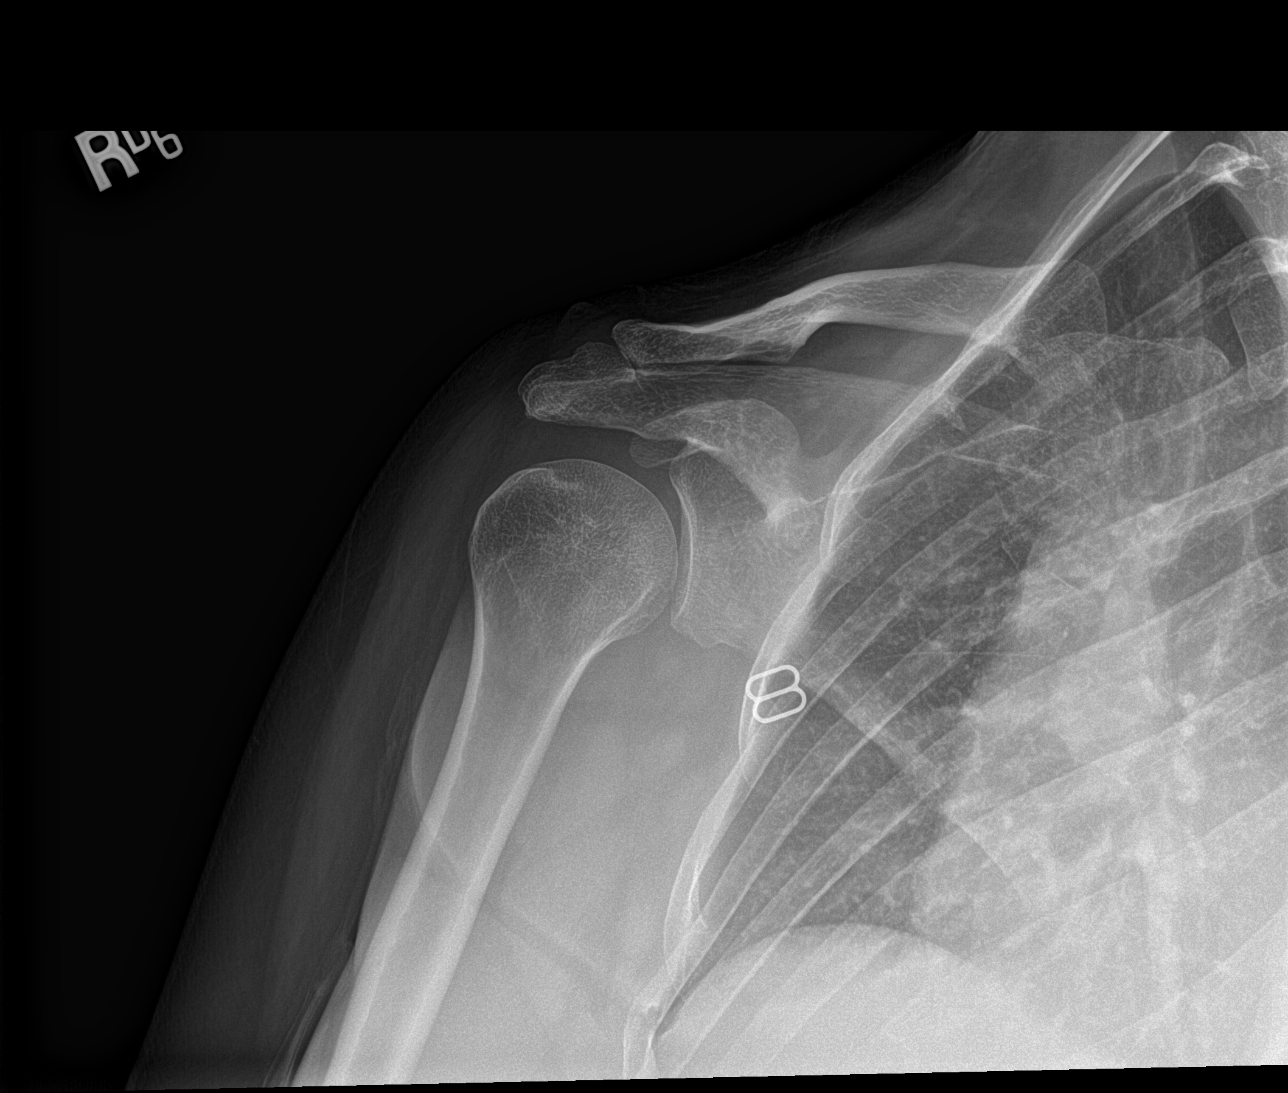

[shoulder y view]
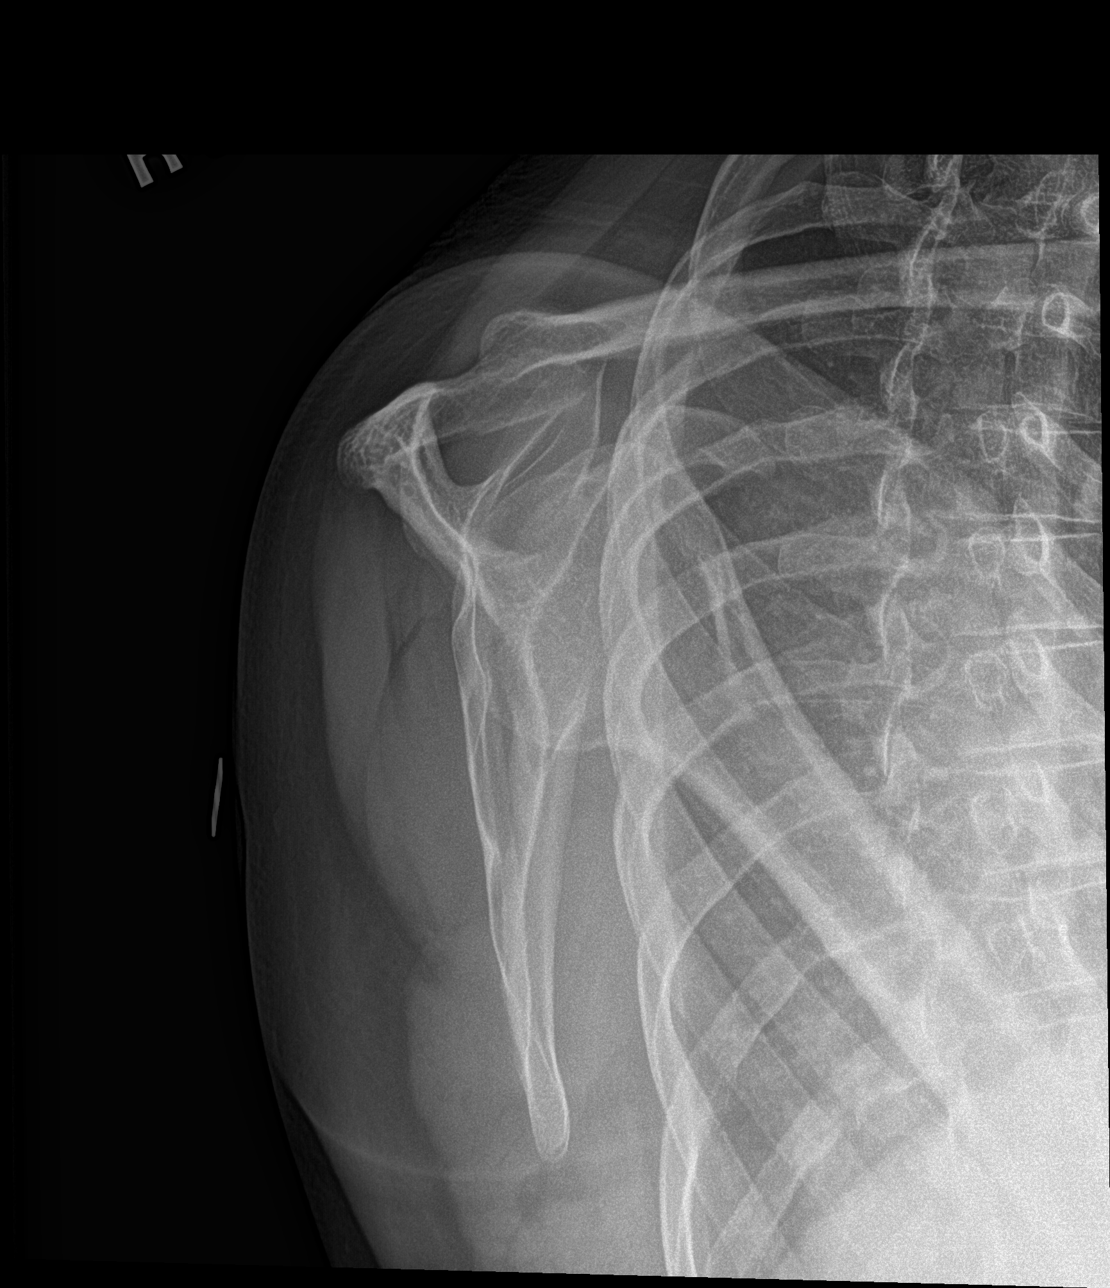

[shoulder axillary]
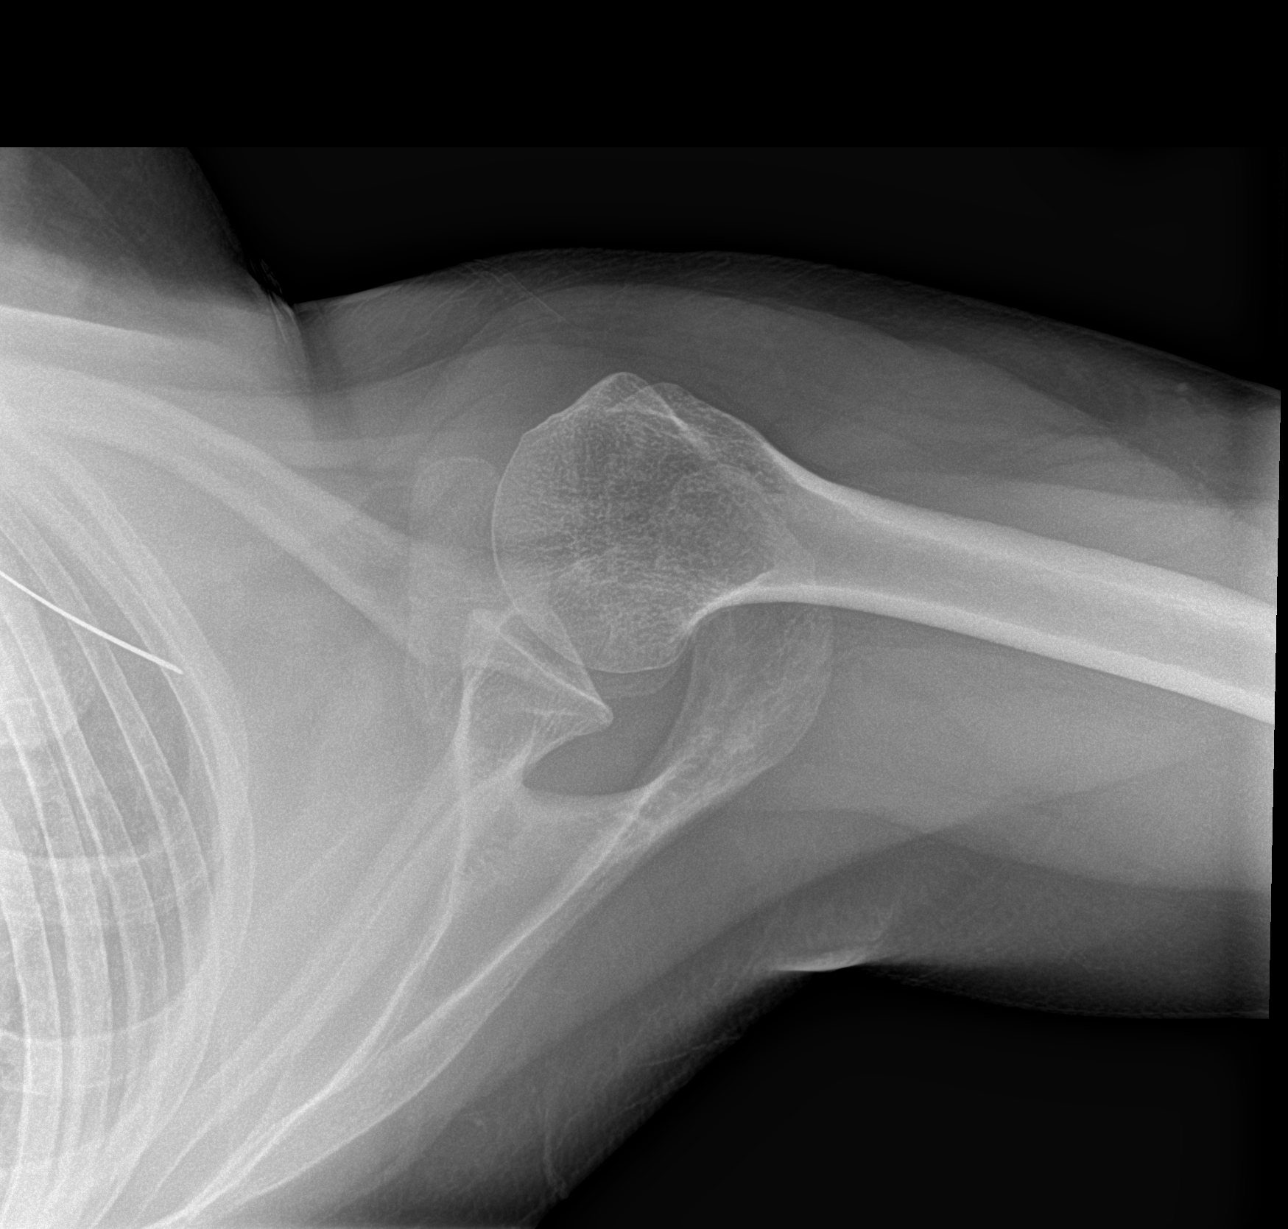

[3 of 3 positions shown; findings below may reference images not displayed]

FINDINGS: There is no evidence of fracture or dislocation. There is no
evidence of arthropathy or other focal bone abnormality. Soft
tissues are unremarkable.
IMPRESSION: Negative.

## 2023-06-27 ENCOUNTER — Other Ambulatory Visit (HOSPITAL_BASED_OUTPATIENT_CLINIC_OR_DEPARTMENT_OTHER): Payer: Self-pay

## 2023-07-01 ENCOUNTER — Other Ambulatory Visit (HOSPITAL_BASED_OUTPATIENT_CLINIC_OR_DEPARTMENT_OTHER): Payer: Self-pay

## 2023-07-02 ENCOUNTER — Other Ambulatory Visit (HOSPITAL_BASED_OUTPATIENT_CLINIC_OR_DEPARTMENT_OTHER): Payer: Self-pay

## 2023-07-09 ENCOUNTER — Other Ambulatory Visit (HOSPITAL_BASED_OUTPATIENT_CLINIC_OR_DEPARTMENT_OTHER): Payer: Self-pay

## 2023-07-09 MED ORDER — LISDEXAMFETAMINE DIMESYLATE 20 MG PO CAPS
20.0000 mg | ORAL_CAPSULE | Freq: Every morning | ORAL | 0 refills | Status: DC
  Filled 2023-07-09: qty 30, 30d supply, fill #0

## 2023-07-10 ENCOUNTER — Other Ambulatory Visit (HOSPITAL_BASED_OUTPATIENT_CLINIC_OR_DEPARTMENT_OTHER): Payer: Self-pay

## 2023-07-11 ENCOUNTER — Other Ambulatory Visit (HOSPITAL_BASED_OUTPATIENT_CLINIC_OR_DEPARTMENT_OTHER): Payer: Self-pay

## 2023-07-11 MED ORDER — MAXITROL 3.5-10000-0.1 OP OINT
TOPICAL_OINTMENT | OPHTHALMIC | 3 refills | Status: AC
  Filled 2023-07-11: qty 3.5, 15d supply, fill #0
  Filled 2023-07-26: qty 3.5, 15d supply, fill #1
  Filled 2023-08-10: qty 3.5, 15d supply, fill #2

## 2023-07-12 ENCOUNTER — Other Ambulatory Visit (HOSPITAL_BASED_OUTPATIENT_CLINIC_OR_DEPARTMENT_OTHER): Payer: Self-pay

## 2023-07-15 ENCOUNTER — Other Ambulatory Visit (HOSPITAL_BASED_OUTPATIENT_CLINIC_OR_DEPARTMENT_OTHER): Payer: Self-pay

## 2023-07-18 ENCOUNTER — Other Ambulatory Visit (HOSPITAL_BASED_OUTPATIENT_CLINIC_OR_DEPARTMENT_OTHER): Payer: Self-pay

## 2023-07-25 ENCOUNTER — Other Ambulatory Visit (HOSPITAL_COMMUNITY): Payer: Self-pay

## 2023-07-25 ENCOUNTER — Other Ambulatory Visit (HOSPITAL_BASED_OUTPATIENT_CLINIC_OR_DEPARTMENT_OTHER): Payer: Self-pay

## 2023-07-25 MED ORDER — ERGOCALCIFEROL 1.25 MG (50000 UT) PO CAPS
1.0000 | ORAL_CAPSULE | ORAL | 0 refills | Status: DC
  Filled 2023-07-25: qty 4, 28d supply, fill #0

## 2023-07-26 ENCOUNTER — Other Ambulatory Visit (HOSPITAL_BASED_OUTPATIENT_CLINIC_OR_DEPARTMENT_OTHER): Payer: Self-pay

## 2023-07-29 ENCOUNTER — Other Ambulatory Visit (HOSPITAL_BASED_OUTPATIENT_CLINIC_OR_DEPARTMENT_OTHER): Payer: Self-pay

## 2023-07-29 MED ORDER — ONDANSETRON HCL 4 MG PO TABS
4.0000 mg | ORAL_TABLET | Freq: Three times a day (TID) | ORAL | 1 refills | Status: AC | PRN
  Filled 2023-07-29: qty 10, 4d supply, fill #0

## 2023-07-29 MED ORDER — BUTALBITAL-APAP-CAFFEINE 50-325-40 MG PO TABS
1.0000 | ORAL_TABLET | Freq: Two times a day (BID) | ORAL | 0 refills | Status: DC | PRN
  Filled 2023-07-29: qty 10, 5d supply, fill #0

## 2023-07-30 ENCOUNTER — Other Ambulatory Visit (HOSPITAL_BASED_OUTPATIENT_CLINIC_OR_DEPARTMENT_OTHER): Payer: Self-pay

## 2023-07-30 MED ORDER — UBRELVY 100 MG PO TABS
ORAL_TABLET | ORAL | 1 refills | Status: DC
  Filled 2023-07-30: qty 10, 28d supply, fill #0

## 2023-08-08 ENCOUNTER — Other Ambulatory Visit (HOSPITAL_BASED_OUTPATIENT_CLINIC_OR_DEPARTMENT_OTHER): Payer: Self-pay

## 2023-08-12 ENCOUNTER — Other Ambulatory Visit (HOSPITAL_BASED_OUTPATIENT_CLINIC_OR_DEPARTMENT_OTHER): Payer: Self-pay

## 2023-08-13 ENCOUNTER — Other Ambulatory Visit (HOSPITAL_BASED_OUTPATIENT_CLINIC_OR_DEPARTMENT_OTHER): Payer: Self-pay

## 2023-08-13 MED ORDER — LISDEXAMFETAMINE DIMESYLATE 20 MG PO CAPS
20.0000 mg | ORAL_CAPSULE | Freq: Every morning | ORAL | 0 refills | Status: DC
  Filled 2023-08-13: qty 30, 30d supply, fill #0

## 2023-08-17 ENCOUNTER — Other Ambulatory Visit (HOSPITAL_BASED_OUTPATIENT_CLINIC_OR_DEPARTMENT_OTHER): Payer: Self-pay

## 2023-08-17 MED ORDER — BENZONATATE 100 MG PO CAPS
100.0000 mg | ORAL_CAPSULE | Freq: Three times a day (TID) | ORAL | 0 refills | Status: AC
  Filled 2023-08-17: qty 15, 5d supply, fill #0

## 2023-08-17 MED ORDER — AZITHROMYCIN 250 MG PO TABS
ORAL_TABLET | ORAL | 0 refills | Status: AC
  Filled 2023-08-17: qty 6, 5d supply, fill #0

## 2023-09-02 ENCOUNTER — Other Ambulatory Visit (HOSPITAL_BASED_OUTPATIENT_CLINIC_OR_DEPARTMENT_OTHER): Payer: Self-pay

## 2023-09-03 ENCOUNTER — Other Ambulatory Visit (HOSPITAL_BASED_OUTPATIENT_CLINIC_OR_DEPARTMENT_OTHER): Payer: Self-pay

## 2023-09-03 MED ORDER — ERGOCALCIFEROL 1.25 MG (50000 UT) PO CAPS
1.0000 | ORAL_CAPSULE | ORAL | 0 refills | Status: DC
  Filled 2023-09-03: qty 4, 28d supply, fill #0

## 2023-09-04 ENCOUNTER — Other Ambulatory Visit (HOSPITAL_BASED_OUTPATIENT_CLINIC_OR_DEPARTMENT_OTHER): Payer: Self-pay

## 2023-09-04 ENCOUNTER — Other Ambulatory Visit: Payer: Self-pay

## 2023-09-04 MED ORDER — LISDEXAMFETAMINE DIMESYLATE 20 MG PO CAPS
20.0000 mg | ORAL_CAPSULE | Freq: Every morning | ORAL | 0 refills | Status: DC
  Filled 2023-09-06: qty 30, 30d supply, fill #0

## 2023-09-04 MED ORDER — CLONAZEPAM 0.5 MG PO TBDP
0.5000 mg | ORAL_TABLET | Freq: Two times a day (BID) | ORAL | 0 refills | Status: AC | PRN
Start: 2023-09-03 — End: ?
  Filled 2023-09-04: qty 6, 3d supply, fill #0

## 2023-09-06 ENCOUNTER — Other Ambulatory Visit (HOSPITAL_BASED_OUTPATIENT_CLINIC_OR_DEPARTMENT_OTHER): Payer: Self-pay

## 2023-09-09 ENCOUNTER — Other Ambulatory Visit (HOSPITAL_BASED_OUTPATIENT_CLINIC_OR_DEPARTMENT_OTHER): Payer: Self-pay

## 2023-09-12 ENCOUNTER — Other Ambulatory Visit (HOSPITAL_BASED_OUTPATIENT_CLINIC_OR_DEPARTMENT_OTHER): Payer: Self-pay

## 2023-09-18 ENCOUNTER — Other Ambulatory Visit (HOSPITAL_BASED_OUTPATIENT_CLINIC_OR_DEPARTMENT_OTHER): Payer: Self-pay

## 2023-09-18 MED ORDER — AJOVY 225 MG/1.5ML ~~LOC~~ SOAJ
225.0000 mg | SUBCUTANEOUS | 3 refills | Status: AC
  Filled 2023-09-18: qty 1.5, 28d supply, fill #0
  Filled 2023-10-04 – 2023-10-29 (×3): qty 1.5, 30d supply, fill #0

## 2023-09-23 ENCOUNTER — Other Ambulatory Visit (HOSPITAL_BASED_OUTPATIENT_CLINIC_OR_DEPARTMENT_OTHER): Payer: Self-pay

## 2023-09-23 MED ORDER — FROVATRIPTAN SUCCINATE 2.5 MG PO TABS
2.5000 mg | ORAL_TABLET | ORAL | 1 refills | Status: AC
Start: 2023-09-18 — End: ?
  Filled 2023-09-23: qty 6, 30d supply, fill #0
  Filled 2023-11-06: qty 9, 30d supply, fill #0

## 2023-09-24 ENCOUNTER — Other Ambulatory Visit (HOSPITAL_BASED_OUTPATIENT_CLINIC_OR_DEPARTMENT_OTHER): Payer: Self-pay

## 2023-09-24 MED ORDER — ALBUTEROL SULFATE HFA 108 (90 BASE) MCG/ACT IN AERS
2.0000 | INHALATION_SPRAY | Freq: Four times a day (QID) | RESPIRATORY_TRACT | 0 refills | Status: DC
  Filled 2024-02-05: qty 6.7, 25d supply, fill #0

## 2023-09-27 ENCOUNTER — Other Ambulatory Visit (HOSPITAL_BASED_OUTPATIENT_CLINIC_OR_DEPARTMENT_OTHER): Payer: Self-pay

## 2023-10-03 ENCOUNTER — Other Ambulatory Visit (HOSPITAL_BASED_OUTPATIENT_CLINIC_OR_DEPARTMENT_OTHER): Payer: Self-pay

## 2023-10-04 ENCOUNTER — Other Ambulatory Visit: Payer: Self-pay

## 2023-10-04 ENCOUNTER — Other Ambulatory Visit (HOSPITAL_BASED_OUTPATIENT_CLINIC_OR_DEPARTMENT_OTHER): Payer: Self-pay

## 2023-10-05 ENCOUNTER — Other Ambulatory Visit (HOSPITAL_BASED_OUTPATIENT_CLINIC_OR_DEPARTMENT_OTHER): Payer: Self-pay

## 2023-10-07 ENCOUNTER — Other Ambulatory Visit (HOSPITAL_BASED_OUTPATIENT_CLINIC_OR_DEPARTMENT_OTHER): Payer: Self-pay

## 2023-10-07 MED ORDER — PREDNISONE 20 MG PO TABS
20.0000 mg | ORAL_TABLET | Freq: Every day | ORAL | 0 refills | Status: AC
  Filled 2023-10-07 – 2023-10-17 (×2): qty 5, 5d supply, fill #0

## 2023-10-07 MED ORDER — AMOXICILLIN-POT CLAVULANATE 875-125 MG PO TABS
1.0000 | ORAL_TABLET | Freq: Two times a day (BID) | ORAL | 0 refills | Status: AC
  Filled 2023-10-07 – 2023-10-29 (×3): qty 14, 7d supply, fill #0

## 2023-10-07 MED ORDER — BENZONATATE 100 MG PO CAPS
100.0000 mg | ORAL_CAPSULE | ORAL | 0 refills | Status: AC | PRN
  Filled 2023-10-07 – 2023-10-17 (×2): qty 30, 5d supply, fill #0

## 2023-10-14 ENCOUNTER — Other Ambulatory Visit (HOSPITAL_BASED_OUTPATIENT_CLINIC_OR_DEPARTMENT_OTHER): Payer: Self-pay

## 2023-10-14 ENCOUNTER — Other Ambulatory Visit: Payer: Self-pay

## 2023-10-14 MED ORDER — ERGOCALCIFEROL 1.25 MG (50000 UT) PO CAPS
1.0000 | ORAL_CAPSULE | ORAL | 0 refills | Status: DC
  Filled 2023-10-14: qty 4, 28d supply, fill #0

## 2023-10-14 MED ORDER — LISDEXAMFETAMINE DIMESYLATE 20 MG PO CAPS
20.0000 mg | ORAL_CAPSULE | Freq: Every morning | ORAL | 0 refills | Status: DC
  Filled 2023-10-14: qty 30, 30d supply, fill #0

## 2023-10-15 ENCOUNTER — Other Ambulatory Visit (HOSPITAL_BASED_OUTPATIENT_CLINIC_OR_DEPARTMENT_OTHER): Payer: Self-pay

## 2023-10-17 ENCOUNTER — Other Ambulatory Visit (HOSPITAL_BASED_OUTPATIENT_CLINIC_OR_DEPARTMENT_OTHER): Payer: Self-pay

## 2023-10-18 ENCOUNTER — Other Ambulatory Visit (HOSPITAL_BASED_OUTPATIENT_CLINIC_OR_DEPARTMENT_OTHER): Payer: Self-pay

## 2023-10-22 ENCOUNTER — Other Ambulatory Visit (HOSPITAL_BASED_OUTPATIENT_CLINIC_OR_DEPARTMENT_OTHER): Payer: Self-pay

## 2023-10-22 ENCOUNTER — Other Ambulatory Visit: Payer: Self-pay

## 2023-10-28 ENCOUNTER — Other Ambulatory Visit (HOSPITAL_BASED_OUTPATIENT_CLINIC_OR_DEPARTMENT_OTHER): Payer: Self-pay

## 2023-10-29 ENCOUNTER — Other Ambulatory Visit (HOSPITAL_BASED_OUTPATIENT_CLINIC_OR_DEPARTMENT_OTHER): Payer: Self-pay

## 2023-11-01 ENCOUNTER — Other Ambulatory Visit (HOSPITAL_BASED_OUTPATIENT_CLINIC_OR_DEPARTMENT_OTHER): Payer: Self-pay

## 2023-11-05 ENCOUNTER — Other Ambulatory Visit (HOSPITAL_BASED_OUTPATIENT_CLINIC_OR_DEPARTMENT_OTHER): Payer: Self-pay

## 2023-11-06 ENCOUNTER — Encounter (HOSPITAL_BASED_OUTPATIENT_CLINIC_OR_DEPARTMENT_OTHER): Payer: Self-pay

## 2023-11-06 ENCOUNTER — Other Ambulatory Visit: Payer: Self-pay

## 2023-11-06 ENCOUNTER — Other Ambulatory Visit (HOSPITAL_BASED_OUTPATIENT_CLINIC_OR_DEPARTMENT_OTHER): Payer: Self-pay

## 2023-11-22 ENCOUNTER — Other Ambulatory Visit (HOSPITAL_BASED_OUTPATIENT_CLINIC_OR_DEPARTMENT_OTHER): Payer: Self-pay

## 2023-11-24 ENCOUNTER — Other Ambulatory Visit (HOSPITAL_BASED_OUTPATIENT_CLINIC_OR_DEPARTMENT_OTHER): Payer: Self-pay

## 2023-11-24 MED ORDER — LISDEXAMFETAMINE DIMESYLATE 20 MG PO CAPS
20.0000 mg | ORAL_CAPSULE | Freq: Every morning | ORAL | 0 refills | Status: DC
  Filled 2023-11-24: qty 30, 30d supply, fill #0

## 2023-11-25 ENCOUNTER — Other Ambulatory Visit (HOSPITAL_BASED_OUTPATIENT_CLINIC_OR_DEPARTMENT_OTHER): Payer: Self-pay

## 2023-11-25 MED ORDER — ERGOCALCIFEROL 1.25 MG (50000 UT) PO CAPS
1.0000 | ORAL_CAPSULE | ORAL | 0 refills | Status: DC
  Filled 2023-11-25: qty 4, 28d supply, fill #0

## 2023-11-26 ENCOUNTER — Other Ambulatory Visit (HOSPITAL_BASED_OUTPATIENT_CLINIC_OR_DEPARTMENT_OTHER): Payer: Self-pay

## 2023-12-02 ENCOUNTER — Other Ambulatory Visit (HOSPITAL_BASED_OUTPATIENT_CLINIC_OR_DEPARTMENT_OTHER): Payer: Self-pay

## 2023-12-02 ENCOUNTER — Other Ambulatory Visit: Payer: Self-pay

## 2023-12-02 MED ORDER — LOSARTAN POTASSIUM 50 MG PO TABS
50.0000 mg | ORAL_TABLET | Freq: Every day | ORAL | 3 refills | Status: AC
Start: 2023-12-02 — End: ?
  Filled 2023-12-02: qty 90, 90d supply, fill #0
  Filled 2024-02-25: qty 90, 90d supply, fill #1
  Filled 2024-05-25: qty 90, 90d supply, fill #2
  Filled 2024-08-24: qty 90, 90d supply, fill #3

## 2023-12-02 MED ORDER — LISDEXAMFETAMINE DIMESYLATE 20 MG PO CAPS
20.0000 mg | ORAL_CAPSULE | Freq: Every morning | ORAL | 0 refills | Status: DC
  Filled 2023-12-02 – 2024-01-03 (×2): qty 30, 30d supply, fill #0

## 2023-12-02 MED ORDER — WEGOVY 0.25 MG/0.5ML ~~LOC~~ SOAJ
0.2500 mg | SUBCUTANEOUS | 0 refills | Status: DC
Start: 2023-12-02 — End: 2023-12-16
  Filled 2023-12-02 – 2023-12-05 (×2): qty 2, 28d supply, fill #0

## 2023-12-02 MED ORDER — AMLODIPINE BESYLATE 2.5 MG PO TABS
2.5000 mg | ORAL_TABLET | Freq: Every day | ORAL | 3 refills | Status: AC
  Filled 2023-12-02: qty 30, 30d supply, fill #0
  Filled 2023-12-27: qty 30, 30d supply, fill #1
  Filled 2024-01-27: qty 30, 30d supply, fill #2
  Filled 2024-02-25: qty 30, 30d supply, fill #3

## 2023-12-02 MED ORDER — ROSUVASTATIN CALCIUM 5 MG PO TABS
5.0000 mg | ORAL_TABLET | Freq: Every day | ORAL | 3 refills | Status: AC
  Filled 2023-12-02: qty 30, 30d supply, fill #0
  Filled 2023-12-27: qty 30, 30d supply, fill #1
  Filled 2024-01-27: qty 30, 30d supply, fill #2
  Filled 2024-02-25: qty 30, 30d supply, fill #3

## 2023-12-04 ENCOUNTER — Other Ambulatory Visit (HOSPITAL_BASED_OUTPATIENT_CLINIC_OR_DEPARTMENT_OTHER): Payer: Self-pay

## 2023-12-05 ENCOUNTER — Other Ambulatory Visit (HOSPITAL_BASED_OUTPATIENT_CLINIC_OR_DEPARTMENT_OTHER): Payer: Self-pay

## 2023-12-08 ENCOUNTER — Other Ambulatory Visit (HOSPITAL_BASED_OUTPATIENT_CLINIC_OR_DEPARTMENT_OTHER): Payer: Self-pay

## 2023-12-11 ENCOUNTER — Other Ambulatory Visit: Payer: Self-pay

## 2023-12-17 ENCOUNTER — Other Ambulatory Visit (HOSPITAL_BASED_OUTPATIENT_CLINIC_OR_DEPARTMENT_OTHER): Payer: Self-pay

## 2023-12-17 MED ORDER — MOUNJARO 2.5 MG/0.5ML ~~LOC~~ SOAJ
2.5000 mg | SUBCUTANEOUS | 0 refills | Status: DC
  Filled 2023-12-17: qty 2, 28d supply, fill #0

## 2023-12-25 ENCOUNTER — Other Ambulatory Visit (HOSPITAL_BASED_OUTPATIENT_CLINIC_OR_DEPARTMENT_OTHER): Payer: Self-pay

## 2024-01-01 ENCOUNTER — Other Ambulatory Visit (HOSPITAL_BASED_OUTPATIENT_CLINIC_OR_DEPARTMENT_OTHER): Payer: Self-pay

## 2024-01-01 MED ORDER — WEGOVY 0.25 MG/0.5ML ~~LOC~~ SOAJ
0.2500 mg | SUBCUTANEOUS | 0 refills | Status: DC
  Filled 2024-01-01: qty 2, 28d supply, fill #0

## 2024-01-03 ENCOUNTER — Other Ambulatory Visit (HOSPITAL_BASED_OUTPATIENT_CLINIC_OR_DEPARTMENT_OTHER): Payer: Self-pay

## 2024-01-03 ENCOUNTER — Other Ambulatory Visit: Payer: Self-pay

## 2024-01-03 MED ORDER — BUTALBITAL-APAP-CAFFEINE 50-325-40 MG PO TABS
1.0000 | ORAL_TABLET | Freq: Two times a day (BID) | ORAL | 0 refills | Status: AC | PRN
  Filled 2024-01-03: qty 10, 5d supply, fill #0

## 2024-01-03 MED ORDER — ERGOCALCIFEROL 1.25 MG (50000 UT) PO CAPS
1.0000 | ORAL_CAPSULE | ORAL | 0 refills | Status: DC
  Filled 2024-01-03: qty 4, 28d supply, fill #0

## 2024-01-06 ENCOUNTER — Other Ambulatory Visit (HOSPITAL_BASED_OUTPATIENT_CLINIC_OR_DEPARTMENT_OTHER): Payer: Self-pay

## 2024-01-06 ENCOUNTER — Encounter (HOSPITAL_BASED_OUTPATIENT_CLINIC_OR_DEPARTMENT_OTHER): Payer: Self-pay

## 2024-01-06 MED ORDER — ZEPBOUND 2.5 MG/0.5ML ~~LOC~~ SOAJ
2.5000 mg | SUBCUTANEOUS | 0 refills | Status: AC
  Filled 2024-01-06: qty 2, 28d supply, fill #0

## 2024-01-08 ENCOUNTER — Other Ambulatory Visit (HOSPITAL_BASED_OUTPATIENT_CLINIC_OR_DEPARTMENT_OTHER): Payer: Self-pay

## 2024-01-08 MED ORDER — NURTEC 75 MG PO TBDP
75.0000 mg | ORAL_TABLET | ORAL | 0 refills | Status: AC
  Filled 2024-01-08: qty 16, 30d supply, fill #0
  Filled 2024-01-30: qty 15, 30d supply, fill #0

## 2024-01-13 ENCOUNTER — Encounter (HOSPITAL_BASED_OUTPATIENT_CLINIC_OR_DEPARTMENT_OTHER): Payer: Self-pay

## 2024-01-13 ENCOUNTER — Other Ambulatory Visit (HOSPITAL_BASED_OUTPATIENT_CLINIC_OR_DEPARTMENT_OTHER): Payer: Self-pay

## 2024-01-25 ENCOUNTER — Other Ambulatory Visit (HOSPITAL_BASED_OUTPATIENT_CLINIC_OR_DEPARTMENT_OTHER): Payer: Self-pay

## 2024-01-29 ENCOUNTER — Other Ambulatory Visit (HOSPITAL_BASED_OUTPATIENT_CLINIC_OR_DEPARTMENT_OTHER): Payer: Self-pay

## 2024-01-29 MED ORDER — ROSUVASTATIN CALCIUM 5 MG PO TABS
5.0000 mg | ORAL_TABLET | Freq: Every day | ORAL | 0 refills | Status: DC
  Filled 2024-03-26: qty 90, 90d supply, fill #0

## 2024-01-29 MED ORDER — ETONOGESTREL-ETHINYL ESTRADIOL 0.12-0.015 MG/24HR VA RING
VAGINAL_RING | VAGINAL | 5 refills | Status: DC
Start: 2024-01-29 — End: 2024-04-09
  Filled 2024-01-29: qty 1, 28d supply, fill #0
  Filled 2024-02-21: qty 1, 28d supply, fill #1
  Filled 2024-03-20: qty 1, 28d supply, fill #2

## 2024-01-29 MED ORDER — AMLODIPINE BESYLATE 2.5 MG PO TABS
2.5000 mg | ORAL_TABLET | Freq: Every day | ORAL | 0 refills | Status: DC
  Filled 2024-03-26: qty 90, 90d supply, fill #0

## 2024-01-29 MED ORDER — LOSARTAN POTASSIUM 50 MG PO TABS
50.0000 mg | ORAL_TABLET | Freq: Every day | ORAL | 0 refills | Status: AC
  Filled 2024-11-18: qty 90, 90d supply, fill #0

## 2024-01-29 MED ORDER — LISDEXAMFETAMINE DIMESYLATE 20 MG PO CAPS
20.0000 mg | ORAL_CAPSULE | Freq: Every morning | ORAL | 0 refills | Status: DC
  Filled 2024-05-29: qty 30, 30d supply, fill #0

## 2024-01-30 ENCOUNTER — Other Ambulatory Visit (HOSPITAL_BASED_OUTPATIENT_CLINIC_OR_DEPARTMENT_OTHER): Payer: Self-pay

## 2024-01-30 MED ORDER — MOUNJARO 5 MG/0.5ML ~~LOC~~ SOAJ
5.0000 mg | SUBCUTANEOUS | 0 refills | Status: DC
  Filled 2024-01-30: qty 2, 28d supply, fill #0

## 2024-01-30 MED ORDER — NURTEC 75 MG PO TBDP
75.0000 mg | ORAL_TABLET | ORAL | 0 refills | Status: AC
  Filled 2024-01-30: qty 15, 30d supply, fill #0

## 2024-01-31 ENCOUNTER — Other Ambulatory Visit (HOSPITAL_BASED_OUTPATIENT_CLINIC_OR_DEPARTMENT_OTHER): Payer: Self-pay

## 2024-02-05 ENCOUNTER — Other Ambulatory Visit: Payer: Self-pay

## 2024-02-05 ENCOUNTER — Other Ambulatory Visit (HOSPITAL_COMMUNITY): Payer: Self-pay

## 2024-02-05 ENCOUNTER — Other Ambulatory Visit (HOSPITAL_BASED_OUTPATIENT_CLINIC_OR_DEPARTMENT_OTHER): Payer: Self-pay

## 2024-02-05 MED ORDER — ZEPBOUND 5 MG/0.5ML ~~LOC~~ SOAJ
5.0000 mg | SUBCUTANEOUS | 0 refills | Status: AC
Start: 2024-02-05 — End: ?
  Filled 2024-02-05 (×2): qty 2, 28d supply, fill #0

## 2024-02-05 MED ORDER — ERGOCALCIFEROL 1.25 MG (50000 UT) PO CAPS
1.0000 | ORAL_CAPSULE | ORAL | 0 refills | Status: AC
  Filled 2024-02-05: qty 4, 28d supply, fill #0

## 2024-02-14 ENCOUNTER — Other Ambulatory Visit (HOSPITAL_BASED_OUTPATIENT_CLINIC_OR_DEPARTMENT_OTHER): Payer: Self-pay

## 2024-02-24 ENCOUNTER — Other Ambulatory Visit (HOSPITAL_BASED_OUTPATIENT_CLINIC_OR_DEPARTMENT_OTHER): Payer: Self-pay

## 2024-02-24 MED ORDER — ALBUTEROL SULFATE HFA 108 (90 BASE) MCG/ACT IN AERS: 2.0000 | INHALATION_SPRAY | Freq: Four times a day (QID) | RESPIRATORY_TRACT | 0 refills | Status: AC

## 2024-02-25 ENCOUNTER — Other Ambulatory Visit: Payer: Self-pay

## 2024-02-25 ENCOUNTER — Other Ambulatory Visit (HOSPITAL_BASED_OUTPATIENT_CLINIC_OR_DEPARTMENT_OTHER): Payer: Self-pay

## 2024-03-03 ENCOUNTER — Other Ambulatory Visit (HOSPITAL_BASED_OUTPATIENT_CLINIC_OR_DEPARTMENT_OTHER): Payer: Self-pay

## 2024-03-03 ENCOUNTER — Other Ambulatory Visit: Payer: Self-pay

## 2024-03-03 MED ORDER — LISDEXAMFETAMINE DIMESYLATE 20 MG PO CAPS
20.0000 mg | ORAL_CAPSULE | Freq: Every morning | ORAL | 0 refills | Status: AC
  Filled 2024-03-03: qty 30, 30d supply, fill #0

## 2024-03-03 MED ORDER — AZELASTINE HCL 0.1 % NA SOLN
1.0000 | Freq: Two times a day (BID) | NASAL | 2 refills | Status: AC
  Filled 2024-03-03: qty 30, 90d supply, fill #0
  Filled 2024-05-27: qty 30, 90d supply, fill #1
  Filled 2024-08-25: qty 30, 34d supply, fill #2

## 2024-03-03 MED ORDER — ZEPBOUND 7.5 MG/0.5ML ~~LOC~~ SOAJ
7.5000 mg | SUBCUTANEOUS | 0 refills | Status: DC
  Filled 2024-03-03: qty 2, 28d supply, fill #0

## 2024-03-20 ENCOUNTER — Other Ambulatory Visit: Payer: Self-pay

## 2024-03-20 ENCOUNTER — Other Ambulatory Visit (HOSPITAL_BASED_OUTPATIENT_CLINIC_OR_DEPARTMENT_OTHER): Payer: Self-pay

## 2024-03-26 ENCOUNTER — Other Ambulatory Visit (HOSPITAL_BASED_OUTPATIENT_CLINIC_OR_DEPARTMENT_OTHER): Payer: Self-pay

## 2024-03-26 MED ORDER — FLUTICASONE PROPIONATE 50 MCG/ACT NA SUSP
1.0000 | Freq: Every day | NASAL | 0 refills | Status: AC
  Filled 2024-03-26: qty 16, 60d supply, fill #0

## 2024-03-26 MED ORDER — DOXYCYCLINE MONOHYDRATE 100 MG PO TABS
100.0000 mg | ORAL_TABLET | Freq: Two times a day (BID) | ORAL | 0 refills | Status: AC
  Filled 2024-03-26: qty 14, 7d supply, fill #0

## 2024-04-02 ENCOUNTER — Other Ambulatory Visit (HOSPITAL_BASED_OUTPATIENT_CLINIC_OR_DEPARTMENT_OTHER): Payer: Self-pay

## 2024-04-02 MED ORDER — LISDEXAMFETAMINE DIMESYLATE 20 MG PO CAPS
20.0000 mg | ORAL_CAPSULE | Freq: Every morning | ORAL | 0 refills | Status: AC
  Filled 2024-04-02: qty 10, 10d supply, fill #0
  Filled 2024-04-02: qty 20, 20d supply, fill #0

## 2024-04-03 ENCOUNTER — Other Ambulatory Visit (HOSPITAL_BASED_OUTPATIENT_CLINIC_OR_DEPARTMENT_OTHER): Payer: Self-pay

## 2024-04-03 ENCOUNTER — Encounter (HOSPITAL_BASED_OUTPATIENT_CLINIC_OR_DEPARTMENT_OTHER): Payer: Self-pay

## 2024-04-04 ENCOUNTER — Emergency Department (HOSPITAL_COMMUNITY)
Admission: EM | Admit: 2024-04-04 | Discharge: 2024-04-04 | Disposition: A | Discharge status: Home / Self Care | Attending: Emergency Medicine | Admitting: Emergency Medicine

## 2024-04-04 ENCOUNTER — Encounter (HOSPITAL_COMMUNITY): Payer: Self-pay

## 2024-04-04 ENCOUNTER — Other Ambulatory Visit (HOSPITAL_BASED_OUTPATIENT_CLINIC_OR_DEPARTMENT_OTHER): Payer: Self-pay

## 2024-04-04 DIAGNOSIS — T7840XA Allergy, unspecified, initial encounter: Secondary | ICD-10-CM | POA: Insufficient documentation

## 2024-04-04 MED ORDER — PREDNISONE 20 MG PO TABS
60.0000 mg | ORAL_TABLET | Freq: Once | ORAL | Status: AC
  Administered 2024-04-04: 60 mg via ORAL
  Filled 2024-04-04: qty 3

## 2024-04-04 MED ORDER — FAMOTIDINE IN NACL 20-0.9 MG/50ML-% IV SOLN
20.0000 mg | Freq: Once | INTRAVENOUS | Status: AC
  Administered 2024-04-04: 20 mg via INTRAVENOUS
  Filled 2024-04-04: qty 50

## 2024-04-04 NOTE — ED Provider Notes (Addendum)
 [facility] AT [facility] Provider Note   CSN: [id number] Arrival date & time: 04/04/24  2056     Patient presents with: Allergic Reaction   Shelby Norton is a 43 y.o. female.   44 old female presents with injury reaction.  Patient is unsure when she was exposed to.  States she felt a scratchiness in her throat.  Took 25 mg of Benadryl .  Patient went to urgent care when she was given an additional 3 5 mg of Benadryl .  She was also given epi 0.3 subcu.  EMS was called and she was given additional 25 mg of Benadryl .  States that she initially had a rash on her neck which is now resolved.  Denies any shortness of breath.  Did have a cough earlier which is also resolved.  Denies any chemical exposure at this time.  Known history of allergy has not       Prior to Admission medications   Medication Sig Start Date End Date Taking? Authorizing Provider  ADDERALL  XR 10 MG 24 hr capsule Take 1 capsule (10 mg total) by mouth daily. 08/07/22     ADDERALL  XR 10 MG 24 hr capsule Take 1 capsule (10 mg total) by mouth every morning. 01/20/23     albuterol  (PROVENTIL  HFA;VENTOLIN  HFA) 108 (90 Base) MCG/ACT inhaler Inhale 1-2 puffs into the lungs every 6 (six) hours as needed for wheezing or shortness of breath.    [provider]  albuterol  (PROVENTIL ) (2.5 MG/3ML) 0.083% nebulizer solution Inhale 3 mLs (2.5 mg total) by nebulization every 6 (six) hours as needed for Wheezing. 07/17/22     albuterol  (VENTOLIN  HFA) 108 (90 Base) MCG/ACT inhaler Inhale 2 puffs into the lungs 4 (four) times daily as needed for cough, chest tightness or wheezing 02/24/24     amLODipine  (NORVASC ) 2.5 MG tablet Take 1 tablet (2.5 mg total) by mouth daily. 12/02/23     amLODipine  (NORVASC ) 2.5 MG tablet Take 1 tablet (2.5 mg total) by mouth daily. 01/29/24     amoxicillin -clavulanate (AUGMENTIN ) 875-125 MG tablet Take 1 tablet by mouth every 12 (twelve) hours. 10/07/23      amphetamine -dextroamphetamine  (ADDERALL  XR) 10 MG 24 hr capsule Take 1 capsule (10 mg total) by mouth daily. Patient not taking: No sig reported 09/11/21   Ulyess Gammons, NP  amphetamine -dextroamphetamine  (ADDERALL  XR) 10 MG 24 hr capsule Take 1 capsule (10 mg total) by mouth daily. Patient not taking: Reported on 04/13/2022 11/15/21   Ulyess Gammons, NP  amphetamine -dextroamphetamine  (ADDERALL  XR) 10 MG 24 hr capsule Take 1 capsule (10 mg total) by mouth daily. 03/13/22   Ulyess Gammons, NP  amphetamine -dextroamphetamine  (ADDERALL  XR) 10 MG 24 hr capsule Take 1 capsule (10 mg total) by mouth daily. 08/21/22     amphetamine -dextroamphetamine  (ADDERALL  XR) 10 MG 24 hr capsule Take 1 capsule (10 mg total) by mouth daily. 09/19/22     azelastine  (ASTELIN ) 0.1 % nasal spray Place 1 spray into both nostrils 2 (two) times daily. 03/03/24     azithromycin  (ZITHROMAX  Z-PAK) 250 MG tablet Take 2 tablets (500mg  total) by mouth on day 1 then take 1 tablet (250mg  total) by mouth daily for days 2-5 08/17/23     azithromycin  (ZITHROMAX ) 250 MG tablet Take two tablets on the first day and then one tablet every day after. Patient not taking: Reported on 04/13/2022 12/19/21     azithromycin  (ZITHROMAX ) 250 MG tablet Take two tablets on the first day and then one  tablet every day after. 11/21/22     benzonatate  (TESSALON ) 100 MG capsule Take 1 capsule (100 mg total) by mouth 3 (three) times daily as needed for up to 10 days. Patient not taking: Reported on 04/13/2022 10/12/21     benzonatate  (TESSALON ) 100 MG capsule Take 1 capsule (100 mg total) by mouth 3 (three) times daily. 08/17/23     benzonatate  (TESSALON ) 100 MG capsule Take 1 capsule (100 mg total) by mouth every 4 (four) hours while awake as needed for coughing. 10/07/23     benzonatate  (TESSALON ) 200 MG capsule Take 1 capsule (200 mg total) by mouth 3 (three)  times daily as needed for up to 7 days for Cough. Patient not taking: Reported on 04/13/2022 12/19/21      buPROPion  (WELLBUTRIN  SR) 150 MG 12 hr tablet Take 1 tablet (150 mg total) by mouth 2 (two) times daily. 08/17/22   Tabori, Katherine E, MD  butalbital -acetaminophen -caffeine  (FIORICET ) 50-325-40 MG tablet Take 1 tablet by mouth every 12 (twelve) hours as needed for headaches. 01/03/24     clonazePAM  (KLONOPIN ) 0.5 MG disintegrating tablet Take 1 tablet (0.5 mg total) by mouth 2 (two) times daily. 10/04/21   Ulyess Gammons, NP  clonazePAM  (KLONOPIN ) 0.5 MG disintegrating tablet Dissolve 1 tablet (0.5 mg total) by mouth 2 (two) times daily as needed (for flights). 09/03/23     doxycycline  (ADOXA) 100 MG tablet Take 1 tablet (100 mg total) by mouth 2 (two) times daily. 03/26/24     EPINEPHrine 0.3 mg/0.3 mL IJ SOAJ injection Inject 0.3 mg into the skin daily as needed. For allergic reaction Patient not taking: Reported on 04/13/2022 06/22/14   [provider]  ergocalciferol  (VITAMIN D2) 1.25 MG (50000 UT) capsule Take 1 capsule (50,000 Units total) by mouth once a week. 02/05/24     etonogestrel -ethinyl estradiol  (NUVARING ) 0.12-0.015 MG/24HR vaginal ring Place one each vaginally every 21 (twenty-one) days and leave in place for three (3) weeks, then remove for one (1) week. 01/29/24     fluticasone  (FLONASE ) 50 MCG/ACT nasal spray Place 1 spray into both nostrils daily. 03/26/24     Fremanezumab -vfrm (AJOVY ) 225 MG/1.5ML SOAJ Inject 225 mg into the skin every 30 (thirty) days. 09/18/23     frovatriptan  (FROVA ) 2.5 MG tablet Take 1 tablet (2.5 mg total) by mouth at onset of migraine. May repeat dose once after 2 hours if needed. 09/18/23     lisdexamfetamine (VYVANSE ) 20 MG capsule Take 1 capsule (20 mg total) by mouth in the morning. 01/29/24     lisdexamfetamine (VYVANSE ) 20 MG capsule Take 1 capsule (20 mg total) by mouth every morning. 03/03/24     lisdexamfetamine (VYVANSE ) 20 MG capsule Take 1 capsule (20 mg total) by mouth in the morning. 04/02/24     losartan  (COZAAR ) 50 MG tablet Take 1 tablet  (50 mg total) by mouth daily. 12/02/23     losartan  (COZAAR ) 50 MG tablet Take 1 tablet (50 mg total) by mouth daily. 01/29/24     methocarbamol  (ROBAXIN ) 500 MG tablet Take 1 tablet (500 mg total) by mouth 3 (three) times daily as needed for muscle spasms. 06/17/23   Coleman Daughters, MD  methylPREDNISolone  (MEDROL  DOSEPAK) 4 MG TBPK tablet Follow package directions Patient not taking: Reported on 04/13/2022 10/12/21     neomycin -polymyxin-dexameth (MAXITROL ) 0.1 % OINT Apply a small amount to both eyes twice a day 07/11/23     ondansetron  (ZOFRAN ) 4 MG tablet Take 1 tablet (4 mg total)  by mouth every 8 (eight) hours as needed for nausea or vomiting. 07/29/23   Angelia Kelp, NP  oseltamivir  (TAMIFLU ) 75 MG capsule Take 1 capsule (75 mg total) by mouth 2 (two) times daily for 5 days. 11/14/22     predniSONE  (DELTASONE ) 10 MG tablet Take as directed on dose pack for 6 days. See attached sheet. Patient not taking: Reported on 04/13/2022 12/19/21     predniSONE  (DELTASONE ) 10 MG tablet Take as directed for 6 days. 07/17/22     Probiotic Product (PROBIOTIC PO) Take 2 capsules by mouth daily. Patient not taking: Reported on 04/13/2022    [provider]  Rimegepant Sulfate  (NURTEC) 75 MG TBDP Dissolve 1 tablet (75 mg total) by mouth once as needed for Migraine (no more than one in 24 hours). 07/10/22     Rimegepant Sulfate  (NURTEC) 75 MG TBDP Dissolve 1 tablet (75 mg total) by mouth once as needed for up to 1 dose for Migraine (no more than one in 24 hours). 09/19/22     Rimegepant Sulfate  (NURTEC) 75 MG TBDP Dissolve 1 tablet (75 mg) on tongue once daily as needed for migraine for up to 1 dose. 12/24/22     Rimegepant Sulfate  (NURTEC) 75 MG TBDP Dissolve 1 tablet (75 mg total) on tongue every other day. 01/08/24     Rimegepant Sulfate  (NURTEC) 75 MG TBDP Take 1 tablet (75 mg total) by mouth every other day. 01/30/24     rosuvastatin  (CRESTOR ) 5 MG tablet Take 1 tablet (5 mg total) by mouth daily. 12/02/23      rosuvastatin  (CRESTOR ) 5 MG tablet Take 1 tablet (5 mg total) by mouth daily. 01/29/24     tirzepatide  (ZEPBOUND ) 2.5 MG/0.5ML Pen Inject 2.5 mg into the skin every 7 (seven) days. 01/03/24     tirzepatide  (ZEPBOUND ) 5 MG/0.5ML Pen Inject 5 mg into the skin once a week. 02/05/24     topiramate  (TOPAMAX ) 25 MG tablet Take 1 tablet (25 mg total) by mouth daily. 11/15/21   Ulyess Gammons, NP  vitamin C (ASCORBIC ACID) 500 MG tablet Take 500 mg by mouth daily. Patient not taking: Reported on 04/13/2022    [provider]  Vitamin D , Ergocalciferol , (DRISDOL ) 1.25 MG (50000 UNIT) CAPS capsule Take 1 capsule (50,000 Units total) by mouth every 7 (seven) days. 05/29/22   Webb, Padonda B, FNP  Vitamin D , Ergocalciferol , (DRISDOL ) 1.25 MG (50000 UNIT) CAPS capsule Take 1 capsule (50,000 Units total) by mouth once a week. 08/07/22   Angelia Kelp, NP  Vitamin D , Ergocalciferol , (DRISDOL ) 1.25 MG (50000 UNIT) CAPS capsule Take 1 capsule (50,000 Units total) by mouth once a week. 06/25/23     zinc gluconate 50 MG tablet Take 50 mg by mouth daily.    [provider]  atomoxetine  (STRATTERA ) 18 MG capsule Take 1 capsule (18 mg total) by mouth daily. Swallow capsule whole; do not open. If opened accidentally, do not touch eyes; wash hands immediately (product is an eye irritant). 06/11/23 07/29/23    FLUoxetine  (PROZAC ) 20 MG capsule Take 1 capsule (20 mg total) by mouth daily. 10/04/21 12/19/21  Ulyess Gammons, NP    Allergies: Aspirin, Other, Codeine, Hydrocodone, Sulfamethoxazole, and Betamethasone     Review of Systems  All other systems reviewed and are negative.   Updated Vital Signs BP (!) 123/104 (BP Location: Right Arm)   Pulse (!) 107   Temp 98.2 F (36.8 C) (Oral)   Resp 18   Ht 1.651 m (5'  5)   Wt 74.8 kg   LMP 03/22/2024 (Approximate)   SpO2 100%   BMI 27.46 kg/m   Physical Exam Vitals and nursing note reviewed.  Constitutional:      General: She is not in acute  distress.    Appearance: Normal appearance. She is well-developed. She is not toxic-appearing.  HENT:     Head: Normocephalic and atraumatic.   Eyes:     General: Lids are normal.     Conjunctiva/sclera: Conjunctivae normal.     Pupils: Pupils are equal, round, and reactive to light.   Neck:     Thyroid: No thyroid mass.     Trachea: No tracheal deviation.   Cardiovascular:     Rate and Rhythm: Normal rate and regular rhythm.     Heart sounds: Normal heart sounds. No murmur heard.    No gallop.  Pulmonary:     Effort: Pulmonary effort is normal. No respiratory distress.     Breath sounds: Normal breath sounds. No stridor. No decreased breath sounds, wheezing, rhonchi or rales.  Abdominal:     General: There is no distension.     Palpations: Abdomen is soft.     Tenderness: There is no abdominal tenderness. There is no rebound.   Musculoskeletal:        General: No tenderness. Normal range of motion.     Cervical back: Normal range of motion and neck supple.   Skin:    General: Skin is warm and dry.     Findings: No abrasion or rash.   Neurological:     Mental Status: She is alert and oriented to person, place, and time. Mental status is at baseline.     GCS: GCS eye subscore is 4. GCS verbal subscore is 5. GCS motor subscore is 6.     Cranial Nerves: No cranial nerve deficit.     Sensory: No sensory deficit.     Motor: Motor function is intact.   Psychiatric:        Attention and Perception: Attention normal.        Speech: Speech normal.        Behavior: Behavior normal.     (all labs ordered are listed, but only abnormal results are displayed) Labs Reviewed - No data to display  EKG: None  Radiology: No results found.   Procedures   Medications Ordered in the ED  famotidine (PEPCID) IVPB 20 mg premix (has no administration in time range)  predniSONE  (DELTASONE ) tablet 60 mg (has no administration in time range)                                     Medical Decision Making Risk Prescription drug management.   Patient given prednisone  here along with Pepcid.  Patient feels better after observation.  Has epinephrine pen at home.  Will be discharged     Final diagnoses:  None    ED Discharge Orders     None          Lind Repine, MD 04/04/24 2202    Lind Repine, MD 04/04/24 2202

## 2024-04-04 NOTE — ED Triage Notes (Signed)
 Pt BIB [facility] from urgent care after being seen for allergic reaction: SOB, throat swelling, itching, redness on face & chest.  .3mg  epi @ urgent care 25mg  benadryl  IM @ urgent care 25mg  benadryl  IV & 300ml of NS via 18g LAC by EMS VSS  Upon arrival pt denis SOB

## 2024-04-06 ENCOUNTER — Other Ambulatory Visit (HOSPITAL_BASED_OUTPATIENT_CLINIC_OR_DEPARTMENT_OTHER): Payer: Self-pay

## 2024-04-06 MED ORDER — ZEPBOUND 7.5 MG/0.5ML ~~LOC~~ SOAJ
7.5000 mg | SUBCUTANEOUS | 1 refills | Status: AC
  Filled 2024-04-06: qty 2, 28d supply, fill #0

## 2024-04-13 ENCOUNTER — Other Ambulatory Visit (HOSPITAL_BASED_OUTPATIENT_CLINIC_OR_DEPARTMENT_OTHER): Payer: Self-pay

## 2024-04-13 MED ORDER — EPINEPHRINE 0.3 MG/0.3ML IJ SOAJ
0.3000 mg | Freq: Once | INTRAMUSCULAR | 0 refills | Status: AC | PRN
  Filled 2024-04-13: qty 2, 2d supply, fill #0

## 2024-04-28 ENCOUNTER — Other Ambulatory Visit (HOSPITAL_BASED_OUTPATIENT_CLINIC_OR_DEPARTMENT_OTHER): Payer: Self-pay

## 2024-04-28 ENCOUNTER — Other Ambulatory Visit: Payer: Self-pay

## 2024-04-28 MED ORDER — ZEPBOUND 10 MG/0.5ML ~~LOC~~ SOAJ
10.0000 mg | SUBCUTANEOUS | 1 refills | Status: DC
  Filled 2024-04-28: qty 2, 28d supply, fill #0
  Filled 2024-05-21: qty 2, 28d supply, fill #1

## 2024-05-18 ENCOUNTER — Other Ambulatory Visit (HOSPITAL_BASED_OUTPATIENT_CLINIC_OR_DEPARTMENT_OTHER): Payer: Self-pay

## 2024-05-21 ENCOUNTER — Other Ambulatory Visit: Payer: Self-pay

## 2024-05-21 ENCOUNTER — Other Ambulatory Visit (HOSPITAL_BASED_OUTPATIENT_CLINIC_OR_DEPARTMENT_OTHER): Payer: Self-pay

## 2024-05-25 ENCOUNTER — Other Ambulatory Visit (HOSPITAL_BASED_OUTPATIENT_CLINIC_OR_DEPARTMENT_OTHER): Payer: Self-pay

## 2024-05-25 ENCOUNTER — Other Ambulatory Visit: Payer: Self-pay

## 2024-05-27 ENCOUNTER — Other Ambulatory Visit: Payer: Self-pay

## 2024-05-29 ENCOUNTER — Other Ambulatory Visit (HOSPITAL_BASED_OUTPATIENT_CLINIC_OR_DEPARTMENT_OTHER): Payer: Self-pay

## 2024-06-02 ENCOUNTER — Other Ambulatory Visit (HOSPITAL_BASED_OUTPATIENT_CLINIC_OR_DEPARTMENT_OTHER): Payer: Self-pay

## 2024-06-20 ENCOUNTER — Other Ambulatory Visit (HOSPITAL_BASED_OUTPATIENT_CLINIC_OR_DEPARTMENT_OTHER): Payer: Self-pay

## 2024-06-23 ENCOUNTER — Other Ambulatory Visit (HOSPITAL_BASED_OUTPATIENT_CLINIC_OR_DEPARTMENT_OTHER): Payer: Self-pay

## 2024-06-23 MED ORDER — ROSUVASTATIN CALCIUM 5 MG PO TABS
5.0000 mg | ORAL_TABLET | Freq: Every day | ORAL | 1 refills | Status: AC
  Filled 2024-06-23: qty 90, 90d supply, fill #0
  Filled 2024-09-18 – 2024-10-24 (×4): qty 90, 90d supply, fill #1

## 2024-06-23 MED ORDER — AMLODIPINE BESYLATE 2.5 MG PO TABS
2.5000 mg | ORAL_TABLET | Freq: Every day | ORAL | 1 refills | Status: AC
  Filled 2024-06-23: qty 90, 90d supply, fill #0
  Filled 2024-09-18 – 2024-10-24 (×4): qty 90, 90d supply, fill #1

## 2024-06-25 ENCOUNTER — Other Ambulatory Visit (HOSPITAL_BASED_OUTPATIENT_CLINIC_OR_DEPARTMENT_OTHER): Payer: Self-pay

## 2024-06-25 MED ORDER — ZEPBOUND 10 MG/0.5ML ~~LOC~~ SOAJ
10.0000 mg | SUBCUTANEOUS | 1 refills | Status: DC
  Filled 2024-06-25: qty 2, 28d supply, fill #0
  Filled 2024-07-18: qty 2, 28d supply, fill #1

## 2024-07-13 ENCOUNTER — Other Ambulatory Visit (HOSPITAL_BASED_OUTPATIENT_CLINIC_OR_DEPARTMENT_OTHER): Payer: Self-pay

## 2024-07-13 MED ORDER — PREDNISONE 20 MG PO TABS
ORAL_TABLET | ORAL | 0 refills | Status: DC
  Filled 2024-07-13: qty 10, 7d supply, fill #0

## 2024-07-13 MED ORDER — AZITHROMYCIN 500 MG PO TABS
500.0000 mg | ORAL_TABLET | Freq: Every day | ORAL | 0 refills | Status: AC
  Filled 2024-07-13: qty 5, 5d supply, fill #0

## 2024-07-13 MED ORDER — PROMETHAZINE-DM 6.25-15 MG/5ML PO SYRP
5.0000 mL | ORAL_SOLUTION | Freq: Every evening | ORAL | 0 refills | Status: DC | PRN
  Filled 2024-07-13: qty 118, 23d supply, fill #0

## 2024-07-13 MED ORDER — BENZONATATE 200 MG PO CAPS
200.0000 mg | ORAL_CAPSULE | Freq: Three times a day (TID) | ORAL | 0 refills | Status: AC | PRN
  Filled 2024-07-13: qty 30, 10d supply, fill #0

## 2024-07-18 ENCOUNTER — Other Ambulatory Visit (HOSPITAL_BASED_OUTPATIENT_CLINIC_OR_DEPARTMENT_OTHER): Payer: Self-pay

## 2024-07-20 ENCOUNTER — Other Ambulatory Visit: Payer: Self-pay

## 2024-08-13 ENCOUNTER — Other Ambulatory Visit (HOSPITAL_BASED_OUTPATIENT_CLINIC_OR_DEPARTMENT_OTHER): Payer: Self-pay

## 2024-08-21 ENCOUNTER — Other Ambulatory Visit (HOSPITAL_BASED_OUTPATIENT_CLINIC_OR_DEPARTMENT_OTHER): Payer: Self-pay

## 2024-08-21 ENCOUNTER — Other Ambulatory Visit: Payer: Self-pay

## 2024-08-21 MED ORDER — ZEPBOUND 10 MG/0.5ML ~~LOC~~ SOAJ
10.0000 mg | SUBCUTANEOUS | 1 refills | Status: DC
  Filled 2024-08-21: qty 2, 28d supply, fill #0
  Filled 2024-09-12: qty 2, 28d supply, fill #1

## 2024-08-24 ENCOUNTER — Other Ambulatory Visit (HOSPITAL_BASED_OUTPATIENT_CLINIC_OR_DEPARTMENT_OTHER): Payer: Self-pay

## 2024-08-24 ENCOUNTER — Encounter (HOSPITAL_BASED_OUTPATIENT_CLINIC_OR_DEPARTMENT_OTHER): Payer: Self-pay

## 2024-08-24 MED ORDER — LISDEXAMFETAMINE DIMESYLATE 20 MG PO CAPS
20.0000 mg | ORAL_CAPSULE | Freq: Every morning | ORAL | 0 refills | Status: AC
  Filled 2024-08-24: qty 30, 30d supply, fill #0

## 2024-08-25 ENCOUNTER — Other Ambulatory Visit (HOSPITAL_BASED_OUTPATIENT_CLINIC_OR_DEPARTMENT_OTHER): Payer: Self-pay

## 2024-08-26 ENCOUNTER — Other Ambulatory Visit (HOSPITAL_BASED_OUTPATIENT_CLINIC_OR_DEPARTMENT_OTHER): Payer: Self-pay

## 2024-08-26 MED ORDER — NURTEC 75 MG PO TBDP
ORAL_TABLET | ORAL | 3 refills | Status: AC
  Filled 2024-08-26: qty 16, 30d supply, fill #0
  Filled 2024-10-12: qty 30, 30d supply, fill #0

## 2024-08-31 ENCOUNTER — Other Ambulatory Visit (HOSPITAL_BASED_OUTPATIENT_CLINIC_OR_DEPARTMENT_OTHER): Payer: Self-pay

## 2024-09-02 ENCOUNTER — Other Ambulatory Visit (HOSPITAL_BASED_OUTPATIENT_CLINIC_OR_DEPARTMENT_OTHER): Payer: Self-pay

## 2024-09-12 ENCOUNTER — Other Ambulatory Visit (HOSPITAL_BASED_OUTPATIENT_CLINIC_OR_DEPARTMENT_OTHER): Payer: Self-pay

## 2024-09-14 ENCOUNTER — Other Ambulatory Visit (HOSPITAL_BASED_OUTPATIENT_CLINIC_OR_DEPARTMENT_OTHER): Payer: Self-pay

## 2024-09-15 ENCOUNTER — Other Ambulatory Visit (HOSPITAL_BASED_OUTPATIENT_CLINIC_OR_DEPARTMENT_OTHER): Payer: Self-pay

## 2024-09-16 ENCOUNTER — Other Ambulatory Visit (HOSPITAL_BASED_OUTPATIENT_CLINIC_OR_DEPARTMENT_OTHER): Payer: Self-pay

## 2024-09-18 ENCOUNTER — Other Ambulatory Visit (HOSPITAL_BASED_OUTPATIENT_CLINIC_OR_DEPARTMENT_OTHER): Payer: Self-pay

## 2024-09-28 ENCOUNTER — Other Ambulatory Visit (HOSPITAL_BASED_OUTPATIENT_CLINIC_OR_DEPARTMENT_OTHER): Payer: Self-pay

## 2024-09-29 ENCOUNTER — Other Ambulatory Visit (HOSPITAL_BASED_OUTPATIENT_CLINIC_OR_DEPARTMENT_OTHER): Payer: Self-pay

## 2024-09-29 ENCOUNTER — Other Ambulatory Visit: Payer: Self-pay

## 2024-10-09 ENCOUNTER — Other Ambulatory Visit (HOSPITAL_BASED_OUTPATIENT_CLINIC_OR_DEPARTMENT_OTHER): Payer: Self-pay

## 2024-10-10 ENCOUNTER — Other Ambulatory Visit (HOSPITAL_BASED_OUTPATIENT_CLINIC_OR_DEPARTMENT_OTHER): Payer: Self-pay

## 2024-10-12 ENCOUNTER — Other Ambulatory Visit (HOSPITAL_BASED_OUTPATIENT_CLINIC_OR_DEPARTMENT_OTHER): Payer: Self-pay

## 2024-10-12 ENCOUNTER — Other Ambulatory Visit: Payer: Self-pay

## 2024-10-12 MED ORDER — ZEPBOUND 10 MG/0.5ML ~~LOC~~ SOAJ
10.0000 mg | SUBCUTANEOUS | 1 refills | Status: AC
  Filled 2024-10-12 – 2024-10-14 (×3): qty 2, 28d supply, fill #0

## 2024-10-13 ENCOUNTER — Other Ambulatory Visit (HOSPITAL_BASED_OUTPATIENT_CLINIC_OR_DEPARTMENT_OTHER): Payer: Self-pay

## 2024-10-13 MED ORDER — ZEPBOUND 10 MG/0.5ML ~~LOC~~ SOAJ: 10.0000 mg | SUBCUTANEOUS | 1 refills | Status: AC

## 2024-10-14 ENCOUNTER — Other Ambulatory Visit (HOSPITAL_BASED_OUTPATIENT_CLINIC_OR_DEPARTMENT_OTHER): Payer: Self-pay

## 2024-10-20 ENCOUNTER — Other Ambulatory Visit (HOSPITAL_BASED_OUTPATIENT_CLINIC_OR_DEPARTMENT_OTHER): Payer: Self-pay

## 2024-10-23 ENCOUNTER — Other Ambulatory Visit (HOSPITAL_BASED_OUTPATIENT_CLINIC_OR_DEPARTMENT_OTHER): Payer: Self-pay

## 2024-10-26 ENCOUNTER — Other Ambulatory Visit: Payer: Self-pay

## 2024-10-28 ENCOUNTER — Other Ambulatory Visit (HOSPITAL_BASED_OUTPATIENT_CLINIC_OR_DEPARTMENT_OTHER): Payer: Self-pay

## 2024-11-18 ENCOUNTER — Other Ambulatory Visit: Payer: Self-pay

## 2024-11-18 ENCOUNTER — Other Ambulatory Visit (HOSPITAL_BASED_OUTPATIENT_CLINIC_OR_DEPARTMENT_OTHER): Payer: Self-pay
